# Patient Record
Sex: Male | Born: 1940 | Race: White | Hispanic: No | Marital: Married | Smoking: Former smoker
Health system: Southern US, Community
[De-identification: ages and names within clinical notes are randomized; demographics above are authoritative.]

## PROBLEM LIST (undated history)

## (undated) DIAGNOSIS — H9190 Unspecified hearing loss, unspecified ear: Secondary | ICD-10-CM

## (undated) DIAGNOSIS — R3129 Other microscopic hematuria: Secondary | ICD-10-CM

## (undated) DIAGNOSIS — S0990XA Unspecified injury of head, initial encounter: Secondary | ICD-10-CM

## (undated) DIAGNOSIS — R350 Frequency of micturition: Secondary | ICD-10-CM

## (undated) DIAGNOSIS — I10 Essential (primary) hypertension: Secondary | ICD-10-CM

## (undated) DIAGNOSIS — M1990 Unspecified osteoarthritis, unspecified site: Secondary | ICD-10-CM

## (undated) DIAGNOSIS — I639 Cerebral infarction, unspecified: Secondary | ICD-10-CM

## (undated) DIAGNOSIS — E785 Hyperlipidemia, unspecified: Secondary | ICD-10-CM

## (undated) DIAGNOSIS — C801 Malignant (primary) neoplasm, unspecified: Secondary | ICD-10-CM

## (undated) DIAGNOSIS — I350 Nonrheumatic aortic (valve) stenosis: Secondary | ICD-10-CM

## (undated) DIAGNOSIS — E538 Deficiency of other specified B group vitamins: Secondary | ICD-10-CM

## (undated) DIAGNOSIS — F909 Attention-deficit hyperactivity disorder, unspecified type: Secondary | ICD-10-CM

## (undated) DIAGNOSIS — M419 Scoliosis, unspecified: Secondary | ICD-10-CM

## (undated) HISTORY — PX: HEMORRHOID SURGERY: SHX153

## (undated) HISTORY — DX: Hyperlipidemia, unspecified: E78.5

## (undated) HISTORY — PX: KNEE ARTHROSCOPY: SUR90

## (undated) HISTORY — PX: COLONOSCOPY: SHX174

## (undated) HISTORY — PX: THYROIDECTOMY, PARTIAL: SHX18

## (undated) HISTORY — DX: Other microscopic hematuria: R31.29

## (undated) HISTORY — PX: TONSILLECTOMY: SUR1361

## (undated) HISTORY — DX: Essential (primary) hypertension: I10

## (undated) HISTORY — DX: Deficiency of other specified B group vitamins: E53.8

---

## 2003-10-18 ENCOUNTER — Inpatient Hospital Stay (HOSPITAL_BASED_OUTPATIENT_CLINIC_OR_DEPARTMENT_OTHER): Admission: RE | Admit: 2003-10-18 | Discharge: 2003-10-18 | Payer: Self-pay | Admitting: *Deleted

## 2006-08-03 HISTORY — PX: CARDIAC CATHETERIZATION: SHX172

## 2006-11-09 ENCOUNTER — Ambulatory Visit: Payer: Self-pay | Admitting: Cardiology

## 2006-11-15 ENCOUNTER — Ambulatory Visit: Payer: Self-pay | Admitting: Cardiology

## 2006-11-18 ENCOUNTER — Inpatient Hospital Stay (HOSPITAL_BASED_OUTPATIENT_CLINIC_OR_DEPARTMENT_OTHER): Admission: RE | Admit: 2006-11-18 | Discharge: 2006-11-18 | Payer: Self-pay | Admitting: Cardiovascular Disease

## 2006-11-18 ENCOUNTER — Ambulatory Visit: Payer: Self-pay | Admitting: Cardiovascular Disease

## 2006-11-25 ENCOUNTER — Ambulatory Visit: Payer: Self-pay | Admitting: Cardiology

## 2007-08-04 HISTORY — PX: BACK SURGERY: SHX140

## 2008-05-10 ENCOUNTER — Inpatient Hospital Stay (HOSPITAL_COMMUNITY): Admission: RE | Admit: 2008-05-10 | Discharge: 2008-05-12 | Payer: Self-pay | Admitting: Orthopedic Surgery

## 2009-11-29 ENCOUNTER — Emergency Department (HOSPITAL_COMMUNITY): Admission: EM | Admit: 2009-11-29 | Discharge: 2009-11-29 | Payer: Self-pay | Admitting: Emergency Medicine

## 2010-12-16 NOTE — H&P (Signed)
NAME:  Joe Shah, Joe Shah                ACCOUNT NO.:  [id number]   MEDICAL RECORD NO.:  [id number]          PATIENT TYPE:  INP   LOCATION:                               FACILITY:  [facility]   PHYSICIAN:  Joe Shah, M.D.DATE OF BIRTH:  [birth year]   DATE OF ADMISSION:  05/10/2008  DATE OF DISCHARGE:                              HISTORY & PHYSICAL   CHIEF COMPLAINT:  Joe Shah is a 70 year old gentleman who incurred a  workman's comp injury of his back while on the job.  He has had pain in  the lower back radiating down the right leg with numbness and weakness  in the right leg and foot.  The MRI evaluation shows he has a central  disk rupture at L3-4 with moderate to severe stenosis with a large  ruptured disk migrating cranially at the L5-S1 region compressing the L5  nerve root.  The patient has elected to proceed with a central  decompression and microdiskectomy at L3-4 and a hemilaminectomy at the  L5-S1 right side with a microdiskectomy on the right.   ALLERGIES:  None.   CURRENT MEDICATIONS:  1. Quinapril 40 mg a day.  2. Simvastatin 20 mg a day.  3. Meloxicam 15 mg a day.   PRIMARY CARE PHYSICIAN:  Dr. Samuel Jester in [place]   CARDIOLOGIST:  Dr. Myrtis Ser.   CURRENT MEDICAL HISTORY:  1. Hypertension with a heart murmur.  2. Hypercholesterolemia.  3. Hearing impairment with bilateral hearing aids.  4. Dentures.   REVIEW OF SYSTEMS:  Negative for any neurologic issues.  No pulmonary  issues.  Cardiovascular issues have been stable.  Last stress test was 1  year previous.  GI, GU are unremarkable.  Endocrine is unremarkable.  Hematologic is unremarkable.   PAST SURGICAL HISTORY:  Includes a knee scope and lipoma removal without  any complications.   FAMILY MEDICAL HISTORY:  Father is deceased from old age.  Mother is  deceased from heart failure.   SOCIAL HISTORY:  The patient is married.  He works as a Naval architect.  He has smoked in the past.  No drugs or  alcohol.  Lives in a single  family home with his wife.   PHYSICAL EXAMINATION:  GENERAL:  The patient is a healthy-appearing  gentleman, conscious, alert, appropriate, appears to be good historian.  Appears to be in no extreme distress.  He ambulates and gets on and off  the exam table fairly easily.  HEENT: Head was normocephalic.  Gross hearing is intact with bilateral  hearing aids in place.  NECK:  Supple.  No palpable lymphadenopathy.  Good range of motion.  CHEST:  Lung sounds were clear and equal bilaterally.  No wheezes,  rales, rhonchi.  HEART:  Regular rate and rhythm with a 2/6 systolic murmur.  ABDOMEN:  Positive bowel sounds.  EXTREMITIES:  He had some loss of range of motion of both shoulders due  to some arthritic glenohumeral joints.  He had full range of motion of  his elbows and wrists.  LOWER EXTREMITIES: Both hips had full extension.  He had  30 degrees  internal/external rotation without any discomfort, both knees.  He had  full extension/flexion back to 120 degrees, no instability.  No signs of  infection.  Calves were soft, good motion at the ankles.  NEURO:  The patient was conscious, alert and appropriate, appeared to be  good historian.  He did have some decreased light touch sensation in the  right calf area.  He also had weakness with dorsiflexion at the ankle,  right knee extension and right hip flexor.  He had strong strength in  the left leg.  PERIPHERAL VASCULAR:  Carotid pulses were 2+, no bruits.  Radial pulses  were 2+; dorsalis pedis pulses were 2+.  BREAST, RECTAL AND GU EXAMS:  Deferred at this time.   IMPRESSION:  1. Spinal stenosis with central disk at L4-5 with large herniated disk      at L5-S1 impinging the right nerve root.  2. Hypertension.  3. Cardiac murmur.  4. Hypercholesterolemia.  5. Hearing impairment with bilateral hearing aids.  6. Dentures.   PLAN:  The patient has been cleared by Dr. Myrtis Ser for this upcoming  surgery.  The  patient will undergo all routine labs and tests prior to  having a central decompression at L3-4 with microdiskectomy, a  hemilaminectomy at L5-S1 on the right with microdiskectomy on the right.      Jamelle Rushing, P.A.    ______________________________  Joe Shah, M.D.    RWK/MEDQ  D:  05/01/2008  T:  05/01/2008  Job:  [id number]

## 2010-12-16 NOTE — Op Note (Signed)
NAME:  Joe Shah, Joe Shah                ACCOUNT NO.:  [id number]   MEDICAL RECORD NO.:  [id number]          PATIENT TYPE:  INP   LOCATION:  0005                         FACILITY:  [facility]   PHYSICIAN:  Georges Lynch. Gioffre, M.D.DATE OF BIRTH:  Oct 30, 1940   DATE OF PROCEDURE:  05/10/2008  DATE OF DISCHARGE:                               OPERATIVE REPORT   SURGEON:  Dr. Georges Lynch. Gioffre.   ASSISTANT:  Dr. Marlowe Kays.   PREOPERATIVE DIAGNOSES:  1. Severe spinal stenosis secondary to scoliosis at L3-4.  2. Spinal stenosis at L5-S1.  3. Large herniated disk at L5-S1, migrating cephalad.  Note, this pain      was all in the right leg preoperatively.   OPERATION:  1. Complete decompressive lumbar laminectomy at L3-4 for spinal      stenosis.  2. Decompressive lumbar laminectomy at L5-S1 for spinal stenosis.  3. Microdiskectomy at L5-S1 for a herniated disk on the right.  4. We did foraminotomies at L3-4 bilaterally and also at the S1 root      on the right and the L5 root on the right.   DESCRIPTION OF PROCEDURE:  Under general anesthesia, routine orthopedic  prep and draping of the back was carried out.  He had 2 gm of IV Ancef.  Two needles were placed in the back for localization purposes.  An x-ray  was taken to verify the position.  An incision was made over the L3-4  all the way down to the 5-1 space.  Bleeders identified and cauterized.  The muscle was stripped from the spinous processes of the lamina  bilaterally.  Another x-ray was taken to verify our position.  We then  inserted the self-retaining McCullough retractors.  I then removed the  spinous process of L3 and part of L4.  We completely decompressed the L3-  4 space.  We went out and did foraminotomies bilaterally, and we had  completed this part of the procedure.  We did use the microscope during  the procedure.  We removed the ligamentum flavum in the usual fashion,  which was quite thickened in this area.  Note, when  we completed the  decompression at this level, we were able to easily go out with the  hockey-stick bilaterally and the root now was decompressed.  We then  applied some thrombin-soaked Gelfoam.  We then advanced the dissection  distally, where we went down and removed practically the entire lamina  of L5 on the right.  We decompressed a large area of stenosis.  The  ligamentum flavum was severely thickened.  We had to go distally and  laterally.  Once this was done, we decompressed the lateral recess as  well.  We identified the S1 root above the L5 root.  We gently retracted  the dura and made a cruciate incision in the posterior longitudinal  ligament and did a microdiskectomy.  Note when we completed this  procedure, we checked the foramina for the 5 root and the S1 root on the  right and they were completely free.  The dura now was easily  movable  and there was no further compression.  We thoroughly irrigated out the  area.  Note, the microscope was used at this level as well.  We went  through a meticulous dissection because of his severe scoliosis.  We  thoroughly irrigated out the area.  I then injected 10 mL of FloSeal,  followed by some loose application of Gelfoam.  I then closed the wound  in layers in the  usual fashion, except I left a small distal and deep proximal part of  the wound open for drainage purposes.  The subcu was closed with 0  Vicryl, skin with metal staples and a sterile Neosporin dressing was  applied.  The patient had 2 gm of IV Ancef preop.           ______________________________  Georges Lynch. Darrelyn Hillock, M.D.     RAG/MEDQ  D:  05/10/2008  T:  05/11/2008  Job:  [id number]   cc:   Marlowe Kays, M.D.  Fax: [phone]

## 2010-12-19 NOTE — Discharge Summary (Signed)
NAME:  BLAS, RICHES                ACCOUNT NO.:  [id number]   MEDICAL RECORD NO.:  [id number]          PATIENT TYPE:  INP   LOCATION:  1536                         FACILITY:  [facility]   PHYSICIAN:  Georges Lynch. Gioffre, M.D.DATE OF BIRTH:  22-Sep-1940   DATE OF ADMISSION:  05/10/2008  DATE OF DISCHARGE:  05/12/2008                               DISCHARGE SUMMARY   ADMISSION DIAGNOSIS:  1. Spinal stenosis with central disk at L4-5 with a large herniated      disk at L5-S1 on the right.  2. Hypertension.  3. Cardiac murmur.  4. Hypercholesterolemia.  5. Hearing impairment with bilateral hearing aids.  6. Dentures.   DISCHARGE DIAGNOSES:  1. A central decompression at L3-4 and L5-S1 with microdiskectomy at      L5-S1 on the right  2. Hypertension.  3. Cardiac murmur.  4. Hypercholesterolemia.  5. Hearing impairment with bilateral hearing aids.  6. Dentures.   HISTORY OF PRESENT ILLNESS:  The patient is a 70 year old male who  incurred a Worker's Comp injury to his lower back on the job.  He had  lower back pain with right leg pain and numbness, weakness.  MRI found  disk rupture L3-4, with moderate to severe stenosis at that level and a  large disk migrating cranially at L5-S1 region, compressing the L5 nerve  root.  The patient has elected to proceed with surgical correction.   ALLERGIES:  NONE.   MEDICATIONS ON ADMISSION:  1. Quinapril 40 mg a day.  2. Simvastatin 20 mg a day.  3. Meloxicam 15 mg a day.   SURGICAL PROCEDURE:  On May 10, 2008, the patient was taken to the  OR by Dr. Worthy Rancher, assisted by Dr. Leslee Home.  Under general  anesthesia, the patient underwent a decompressive lumbar laminectomy at  L3-4, L5-S1 with a microdiskectomy at L5-S1 on the right.  The patient  tolerated the procedure well.  Estimated blood loss was 300 mL, no  complications.  The patient was transferred to the recovery room, then  to the orthopedic floor in good condition.   CONSULTS:   Routine consult with physical therapy, case management and  pharmacy.   HOSPITAL COURSE:  On May 10, 2008, the patient was admitted to  [facility] under the care of Dr. Ranee Gosselin.  The patient  was taken to the OR where a decompressive lumbar laminectomy was  performed at L3-4 and L5-S1 with microdiskectomy at L5-S1 on the right.  The patient tolerated the procedure well and was transferred recovery  room and then to the orthopedic floor in good condition on IV pain  medicines and antibiotics.  The patient was also able to get out of bed  with physical therapy per protocol.  The patient then incurred 2 days  postoperative course without any complications.  The patient's wound  remained benign for any signs of infection.  Vital signs remained  stable, he remained afebrile.  He was able to void.  He was able to  transition off IV medicines to p.o. meds well.  The patient worked well  with  physical therapy and was able to ambulate greater than 400 feet.  The patient's lower extremities remained neuromotor vascularly intact.  He did have some back discomfort.  His wound was benign.  The patient  had arrangements made for outpatient discharge to home with his physical  therapy and routine follow-up.   LABORATORY DATA:  CBC on admission was within normal limits as were  routine chemistries.  Urinalysis just showed moderate hemoglobin with an  estimated GFR of greater than 60.  EKG on admission was normal sinus  rhythm at 63 beats per minute.  Chest x-ray on admission showed no  active disease.   DISCHARGE INSTRUCTIONS:   DIET:  No restrictions.   ACTIVITY:  Patient is to follow with physical therapy instructions, may  get up and ambulate.   WOUND CARE:  The patient is to change his dressing daily.   FOLLOW UP:  The patient needs a follow-up appointment with Dr. Darrelyn Hillock  in 2 weeks from date of surgery.  The patient is to call [phone], for  that follow-up  appointment.   MEDICATIONS:  1. Percocet 10/650 one tablet every 4-6 hours for pain if needed.  2. Robaxin 500 mg 1 tablet every 6 hours for muscle spasms if needed.  3. Aspirin 81 mg a day.  4. Simvastatin 20 mg a day.  5. Meloxicam 15 mg a day.  6. Quinapril 40 mg once a day.  7. Benadryl 25 mg at night if needed.   The patient's condition upon discharge home is listed as improved and  good.      Jamelle Rushing, P.A.    ______________________________  Georges Lynch Darrelyn Hillock, M.D.    RWK/MEDQ  D:  05/31/2008  T:  05/31/2008  Job:  [id number]   cc:   Windy Fast A. Darrelyn Hillock, M.D.  Fax: [phone]

## 2010-12-19 NOTE — Letter (Signed)
November 26, 2006    [facility]   RE:  JABRON, Joe Shah  MRN:  [id number]  /  DOB:  May 01, 1941   To Whom it May Concern:   This letter is concerning Mr. [name], a patient of mine.  The  patient is a 70 year old male with moderately severe aortic stenosis.  As part of the evaluation, the patient underwent a cardiac  catheterization and his valve area was 1.1 sq cm with a mean gradient of  21 mmHg.  The patient was found to have no significant coronary artery  disease.  He has normal LV function.   As part of the evaluation also the patient underwent stress testing.  The patient was able to achieve 9.4 METS.  His maximum heart rate was  147 beats per minute.  Maximum blood pressure was 163/86.  The patient  did not develop shortness of breath during stress testing.  There were  no electrocardiographic changes suggestive of ischemia state.   In summary it appears the patient only has moderately severe aortic  stenosis with no coronary artery disease.  Mild dyspnea does not appear  to be secondary to aortic valve disease but rather to deconditioning.  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  He will receive a six month cardiovascular examination  and close monitoring of valve area of his aortic valve.  However, at the  present time his aortic disease is only moderately severe and causes no  functional limitation.  He also has no symptoms of chest pain or  syncope.  He has no coronary artery disease.  The ejection fraction is  within normal limits.  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  His  next physical examination will be in six months as well as formal  evaluation with echocardiography.   If you have any further questions regarding this patient, please do not  hesitate to contact me.     Sincerely,      Learta Codding, MD,FACC  Electronically Signed    GED/MedQ  DD: 11/26/2006  DT: 11/26/2006  Job #: [id number]

## 2010-12-19 NOTE — Assessment & Plan Note (Signed)
[facility]                          [facility] OFFICE NOTE   HARLEN, DANFORD                       MRN:          [id number]  DATE:11/25/2006                            DOB:          1941-02-15    HISTORY OF PRESENT ILLNESS:  The patient is a 70 year old male with  moderately severe aortic stenosis.  The patient underwent cardiac  catheterization.  Valve area was 1.1-cm2.  His mean gradient was 21-  mmHg.  The patient was found to have no significant coronary artery  disease.  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   In summary, it appears that the patient only has mildly severe aortic  stenosis with no coronary artery disease.  He does have some mild  dyspnea but this is likely secondary to deconditioning.  Based on my  prior evaluation, I stated that we would do an exercise stress test if  the valve area was greater than 1-cm2.  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   I did tell the patient today in the office that it is very likely that  he will in the next couple of years require aortic valve replacement and  will need followup every 6 months with repeat echocardiographic studies.   The plan is to proceed with an exercise treadmill test tomorrow.  If the  study demonstrates greater than 6 Metz tolerance, the patient will be  clear, at least in the short term, for commercial driving.     Learta Codding, MD,FACC     GED/MedQ  DD: 11/25/2006  DT: 11/25/2006  Job #: [id number]                            [facility]                          [facility] OFFICE NOTE   ZAHMIR, LALLA                       MRN:          [id number]  DATE:11/25/2006                            DOB:          1941/06/21    HISTORY OF PRESENT ILLNESS:  The patient is a 70 year old male with  moderately severe aortic stenosis.  The patient underwent cardiac  catheterization.  Valve area was 1.1-cm2.  His mean gradient was 21-  mmHg.  The patient was found to have no significant coronary artery  disease.  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   In summary, it appears that the patient only has mildly severe aortic  stenosis with no coronary artery disease.  He does have  some mild  dyspnea but this is likely secondary to deconditioning.  Based on my  prior evaluation, I stated that we would do an exercise stress test if  the valve area was greater than 1-cm2.  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   I did tell the patient today in the office that it is very likely that  he will in the next couple of years require aortic valve replacement and  will need followup every 6 months with repeat echocardiographic studies.   The plan is to proceed with an exercise treadmill test tomorrow.  If the  study demonstrates greater than 6 Metz tolerance, the patient will be  clear, at least in the short term, for commercial driving.     Learta Codding, MD,FACC  Electronically Signed    GED/MedQ  DD: 11/25/2006  DT: 11/25/2006  Job #: [id number]

## 2010-12-19 NOTE — Cardiovascular Report (Signed)
NAME:  ETHEL, VERONICA NO.:  [id number]   MEDICAL RECORD NO.:  [id number]                   PATIENT TYPE:  OIB   LOCATION:  6501                                 FACILITY:  [facility]   PHYSICIAN:  Vida Roller, M.D.                DATE OF BIRTH:  [birth year]   DATE OF PROCEDURE:  10/18/2003  DATE OF DISCHARGE:                              CARDIAC CATHETERIZATION   REFERRING PHYSICIAN:  Jonelle Sidle, M.D. [facility]   PRIMARY CARE PHYSICIAN:  Dr. Charm Barges   HISTORY OF PRESENT ILLNESS:  Mr. Bona is a 70 year old man who has a long  history of hypertension, dyslipidemia, and a remote history of tobacco abuse  who, over the last two years, has had progressive dyspnea on exertion.  He  does have aortic stenosis by echo done in 2001 but normal left ventricular  systolic function.  This was an assessment for coronary ischemia.  He is  followed by Dr. Simona Huh in [facility].   DESCRIPTION OF PROCEDURE:  After obtaining informed consent, the patient was  brought to the cardiac catheterization laboratory in a fasting state where  he was prepped and draped in the usual sterile manner and local anesthetic  was obtained over the right groin using 1% lidocaine without epinephrine.  He received conscious sedation with 1 mg Versed and 25 mcg of Fentanyl.  The  right femoral artery was cannulated using the modified Seldinger technique  with a 4 French 10 cm sheath.  Left heart catheterization was performed  using a 4 French Judkins left #4, a 4 French Judkins right #4, and a 4  French angled pigtail catheter.  The aortic valve was difficult to cross and  was crossed with a Wooley wire and a right Judkins catheter and then an  exchange length J-wire was used to position the pigtail catheter into the  left ventricle.  Left ventriculography was performed in the 30 degree RAO  view.  The pigtail catheter was then prolapsed back across the aortic valve  under  continuous pressure monitoring.  At the completion of the procedure,  the catheters were removed.  The patient recovered in the cardiology holding  area.  The femoral artery sheath was removed.  Hemostasis was obtained using  direct manual pressure.  At the conclusion of the hold, there was no  evidence of ecchymosis or hematoma formation and distal pulses were intact.  Total fluoroscopic time was 7.8 minutes.  Total iodonized contrast was 120  mL.   RESULTS:  Aortic pressure 94/61 with a mean arterial pressure of 76 mmHg.   Left ventricular  pressure 114/0 with an end diastolic pressure of 12 mmHg.  Pull back gradient was approximately 25 mmHg.   The left main coronary artery is a normal size artery which is  angiographically normal.   The left anterior descending  coronary artery is a large vessel with two  diagonal branches and is free of disease.   The left circumflex coronary artery is a very large dominant vessel that has  several posterior descending and posterolateral branches as well as a large  obtuse marginal, all of which are free of disease.   The right coronary artery is a small nondominant vessel.   The left ventriculogram shows a normal systolic function at 60% with no  obvious wall  motion abnormalities and no mitral regurgitation.  The aortic  valve, however, is easily seen on fluoroscopy and is calcified  significantly.   ASSESSMENT:  1. Normal left dominant system with no evidence of coronary artery disease.  2. Mild to moderate calcific aortic stenosis.   RECOMMENDATIONS:  Follow up echocardiogram with Dr. Diona Browner.  I discussed  the case with him and he agrees.                                               Vida Roller, M.D.    JH/MEDQ  D:  10/18/2003  T:  10/20/2003  Job:  [id number]

## 2010-12-19 NOTE — Assessment & Plan Note (Signed)
[facility]                          [facility] OFFICE NOTE   Joe Shah, Joe Shah                      MRN:          [id number]  DATE:11/15/2006                            DOB:          16-Feb-1941    REASON FOR CONSULTATION:  Evaluation of aortic stenosis as part of a DOT  clearance physical.   HISTORY OF PRESENT ILLNESS:  The patient is a 70 year old male with  known aortic stenosis.  The patient was first diagnosed by  echocardiogram with mild aortic stenosis in 2001.  He had normal LV  function at that time.  He also has a long history of hypertension,  dyslipidemia, as well as remote as well as tobacco use.  The patient in  2005 developed progressive dyspnea on exertion and was referred for a  cardiac catheterization.  The patient did not have significant coronary  artery disease but did have by fluoroscopy significantly calcified  aortic valve with a normal ejection fraction.  It was felt that the  patient had mild to moderate calcific aortic stenosis.  The patient was  followed clinically at that time.  However, he has not been seen in our  clinic over the last several years.  More recently, the patient was  evaluated by Dr. Lyn Hollingshead by Derenda Mis as part of a DOT  physical/clearance.  The patient was found again to have a significant  murmur and was referred for repeat echocardiographic study.  This  demonstrated aortic valve area of 0.9 cm2 with a significantly calcified  valve, a peak gradient of 52 mmHg and Dr. Myrtis Ser felt that the patient had  moderate to severe aortic stenosis.  The ejection fraction remained  preserved.  Based on these findings, the patient was told he needed  further cardiology evaluation as part of his DOT clearance.   The patient denies any syncope or congestive heart failure symptoms.  He  also has no angina.  He does have shortness of breath on moderate  exertion.  However, the patient can walk up one flight  of stairs without  any difficulty.  He also stated that he can walk at least a mile to two  miles at a reasonably steady pace without any significant dyspnea.  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  The patient denies any orthopnea or PND, has no  palpitations, and as reported above he has no syncope.  CK here in the  office today also demonstrates normal sinus rhythm without any evidence  of significant left ventricular hypertrophy.   SOCIAL HISTORY:  The patient is a Naval architect, occasionally smokes.  He  denies any alcohol or drug use.   FAMILY HISTORY:  Notable for family history of congestive heart failure  and carcinoma.   PAST MEDICAL HISTORY:  Notable for heart murmur, aortic stenosis  delineated above, history of arthritis, hypertension, and dyslipidemia.   MEDICATIONS:  Quinapril 40 mg a day, Benicar 40 mg half tablet p.o.  weekly, simvastatin 20 mg p.o. daily, meloxicam 15 mg p.o. daily,  aspirin 81 mg a day,  fish oil, Ginseng and melatonin at bedtime.   REVIEW OF SYSTEMS:  As per HPI.  The patient denies any fever or chills.  The patient denies any palpitations, no syncope, no orthopnea or PND.  Positive for dyspnea on exertion as outlined above.  Joint stiffness and  arthritis complaints.  No pruritus, no weakness or tremors and no  anxiety or depression.  No melena or hematochezia.  No dysuria or  frequency.  The remainder of review of systems is within normal limits.   PHYSICAL EXAMINATION:  VITAL SIGNS:  Blood pressure is 150/87, heart  rate is 62 beats per minute (of note is that the patient in the past has  been tried on a diuretic which caused marked hypotension).  GENERAL:  Well-nourished white male in no apparent distress.  HEENT:  Pupils, eyelids, sclerae, conjunctivae clear.  NECK:  Supple.  Normal carotid upstroke with bilateral carotid bruits,  left greater than right.  JVD is approximately 6 cm.  LUNGS:   Clear breath sounds bilaterally.  HEART:  Regular rate and rhythm with a 3-4/6 crescendo/decrescendo  murmur with normal intensity of S2.  PMI is nondisplaced.  ABDOMEN:  Soft, nontender, no rebound or guarding.  Good bowel sounds.  EXTREMITIES:  No cyanosis, clubbing, or edema.  Peripheral pulses are  intact.  Dorsalis pedis and posterior tibial are 2+ bilaterally.  NEUROLOGICAL:  The patient is alert, oriented, and grossly nonfocal.   PROBLEM LIST:  1. Moderately severe aortic stenosis, valve area of 0.9 cm2 by echo      (calcific aortic stenosis).  2. History of nonobstructive coronary artery disease by      catheterization in 2005.  3. Hypertension.  4. Dyslipidemia.  5. Arthritis.   PLAN:  1. The patient based on the 2-D echocardiogram with the valve area of      less than 1 cm2 is disqualified from long distance truck driving.      This is in the setting of an asymptomatic patient.  Also, it is not      clear if the patient's symptoms of dyspnea are secondary to      deconditioning or whether they are related to underlying aortic      stenosis.  2. The patient still remains interested in being able to drive his      truck in the future.  However, I told the patient that if his valve      area truly is less than 1 cm2 whether or not he is not asymptomatic      he is disqualified.  We will proceed with a cardiac      catheterization, left and right heart cath, to determine the      pressure gradient and aortic valve area.  If his valve area is      greater than 1 cm2 then we can proceed with exercise stress      testing.  If the patient has a negative stress test and has good      exercise tolerance then he can be cleared for his DOT physical.  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  3. I have discussed my recommendation at length with the patient and     his wife and they are in clear understanding of the  strategy.  4. The patient will need further control of hypertension but this will  be difficult as he has poorly tolerated diuretics in the past.  We      will make further assessment after his cardiac catheterization.  5. The patient also has significant carotid bruits, in part they could      be radiating from the aortic valve and he has a fairly significant      bruit in the left side and we will obtain carotid Dopplers prior to      his cardiac catheterization.  6. The patient will follow up with me in the office in the next couple      of weeks.  In the interim, he cannot drive and has been taken out      of work by the DOT until at least the end of this month.     Learta Codding, MD,FACC  Electronically Signed    GED/MedQ  DD: 11/15/2006  DT: 11/15/2006  Job #: [id number]   cc:   Jonelle Sidle, MD  Dr. Charm Barges  Dr. Lyn Hollingshead at [facility], [facility]

## 2010-12-19 NOTE — Cardiovascular Report (Signed)
NAME:  Joe Shah, Joe Shah NO.:  [id number]   MEDICAL RECORD NO.:  [id number]          PATIENT TYPE:  OIB   LOCATION:  1962                         FACILITY:  [facility]   PHYSICIAN:  Veverly Fells. Excell Seltzer, MD  DATE OF BIRTH:  [birth year]   DATE OF PROCEDURE:  11/18/2006  DATE OF DISCHARGE:                            CARDIAC CATHETERIZATION   PROCEDURE:  Left heart catheterization, right heart catheterization,  selective coronary angiography, left ventricular angiography.   INDICATION:  Joe Shah is a 70 year old gentleman with moderate to  severe aortic stenosis.  His aortic valve area by echocardiogram was 0.9  cm2 with a peak gradient of 52 mmHg.  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  He has  had some symptoms of shortness of breath.  He was referred for cardiac  catheterization to further define the severity of his aortic stenosis,  as well as the etiology of his symptoms.   Risks and indications of the procedure were explained to the patient.  Informed consent was obtained.  The right groin was prepped, draped and  anesthetized with 1% lidocaine.  Using the modified Seldinger technique,  a 6-French sheath was placed in the right femoral vein and a 5-French  sheath was placed in the right femoral artery.  A multipurpose catheter  was used for the right heart catheterization.  Pressures were recorded  throughout the right heart chambers from the right atrium to the  pulmonary capillary wedge position.  Oxygen saturations were drawn from  the superior vena cava, pulmonary artery and central aorta. Cardiac  output calculations were performed using the Fick technique.   Following the right heart catheterization, an AL-1 catheter was inserted  near the aortic valve, and a straight tip wire was used to cross the  valve.  Crossing the valve was moderately difficult.  The catheter was  then exchanged out over the  straight tip wire for a pigtail catheter and  opening left ventricular pressures were recorded.  A left ventriculogram  was performed. After re-flushing the catheter with saline, a careful  pullback across the aortic valve was performed.  At the conclusion of  the procedure, the sheaths were pulled and manual pressure used for  hemostasis.  There were no immediate complications.   FINDINGS:  Right atrial pressure A-wave 10, V-wave 9, mean of 8.  Right  ventricular pressures is 28/5 with an end-diastolic pressure of 9.  Pulmonary artery pressure is 25/10 with a mean of 17.  Wedge pressure  mean is 13.  Left ventricular pressure is 164/8 with an end-diastolic  pressure of 16.  Aortic pressure is 141/75 with a mean of 105.  The peak  aortic valve gradient is 23 mmHg, and the mean aortic valve gradient is  21 mmHg.  The aortic valve area is 1.1 cm2.  SVC oxygen saturation 67%.  Pulmonary artery saturation 64%.  Aortic saturation is 93%.   Cardiac output by Fick is 4.7 liters per minute.  Cardiac index is 2.3  liters per minute per meter squared.  Coronary angiography - the left mainstem has minor luminal  irregularities that bifurcates into the LAD and left circumflex.   The LAD is a medium caliber vessel that courses down the LV apex.  It  gives off a medium-sized first and second diagonal branch.  There are  minor luminal irregularities in the proximal LAD, as well as mild  calcification.  There is no significant angiographic disease throughout  the LAD system.   The left circumflex is large caliber.  It is a dominant vessel.  It  gives off a large first OM branch followed by two left posterolateral  branches and a left PDA branch.  There is no significant angiographic  disease throughout the left circumflex system.   The right coronary is artery is small and nondominant.  It gives off a  small RV marginal branch from its midportion.  There is no significant  angiographic disease in  the right coronary artery.   Left ventriculography performed in the 30 degree RAO projection shows  normal left ventricular systolic function with an estimated LVEF of 60%.  There is no mitral regurgitation.  The aortic valve is heavily calcified  by fluoroscopy.   ASSESSMENT:  1. Moderate aortic stenosis with a mean aortic valve gradient of 21      mmHg and an aortic valve area calculated at 1.1 cm2.  2. Normal coronary arteries with the exception of very minor plaque in      the proximal left anterior descending and left mainstem.  3. Normal left ventricular systolic function.   The patient will follow-up with Dr. 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  At this point, does not  meet criteria for aortic valve surgery and will require continue  longitudinal follow-up.      Veverly Fells. Excell Seltzer, MD  Electronically Signed     MDC/MEDQ  D:  11/18/2006  T:  11/18/2006  Job:  [id number]   cc:   Learta Codding, MD,FACC

## 2011-05-05 LAB — COMPREHENSIVE METABOLIC PANEL
AST: 22
Albumin: 3.8
CO2: 27
Calcium: 9.5
GFR calc non Af Amer: >60
Glucose, Bld: 100 — ABNORMAL HIGH
Sodium: 145
Total Protein: 6.2

## 2011-05-05 LAB — CBC
HCT: 40.1
Hemoglobin: 13.1
MCV: 82.7
Platelets: 205
RDW: 14.3
WBC: 4.9

## 2011-05-05 LAB — URINE CULTURE
Colony Count: NO GROWTH
Special Requests: NEGATIVE

## 2011-05-05 LAB — URINALYSIS, ROUTINE W REFLEX MICROSCOPIC
Glucose, UA: NEGATIVE
Nitrite: NEGATIVE
Specific Gravity, Urine: 1.026
Urobilinogen, UA: 0.2
pH: 5.5

## 2011-05-05 LAB — DIFFERENTIAL
Basophils Relative: 1
Eosinophils Relative: 4
Lymphocytes Relative: 31
Lymphs Abs: 1.5
Neutrophils Relative %: 56

## 2011-05-05 LAB — APTT: aPTT: 28

## 2011-05-05 LAB — PROTIME-INR: Prothrombin Time: 13.7

## 2011-07-23 ENCOUNTER — Encounter: Payer: Self-pay | Admitting: Cardiovascular Disease

## 2011-07-24 ENCOUNTER — Encounter: Payer: Self-pay | Admitting: Cardiovascular Disease

## 2011-07-24 ENCOUNTER — Telehealth: Payer: Self-pay | Admitting: *Deleted

## 2011-07-24 ENCOUNTER — Ambulatory Visit (INDEPENDENT_AMBULATORY_CARE_PROVIDER_SITE_OTHER): Payer: Medicare Other | Admitting: Cardiovascular Disease

## 2011-07-24 ENCOUNTER — Other Ambulatory Visit: Payer: Self-pay | Admitting: Cardiovascular Disease

## 2011-07-24 ENCOUNTER — Encounter: Payer: Self-pay | Admitting: *Deleted

## 2011-07-24 VITALS — BP 163/85 | HR 73 | Ht 69.0 in | Wt 191.0 lb

## 2011-07-24 DIAGNOSIS — I350 Nonrheumatic aortic (valve) stenosis: Secondary | ICD-10-CM

## 2011-07-24 DIAGNOSIS — I359 Nonrheumatic aortic valve disorder, unspecified: Secondary | ICD-10-CM | POA: Insufficient documentation

## 2011-07-24 DIAGNOSIS — R0989 Other specified symptoms and signs involving the circulatory and respiratory systems: Secondary | ICD-10-CM | POA: Insufficient documentation

## 2011-07-24 DIAGNOSIS — I10 Essential (primary) hypertension: Secondary | ICD-10-CM

## 2011-07-24 DIAGNOSIS — R0602 Shortness of breath: Secondary | ICD-10-CM

## 2011-07-24 NOTE — Assessment & Plan Note (Signed)
The patient likely has severe symptomatic aortic stenosis. In 2008 his aortic valve area was 1.1 cm. I suspect that his aortic valve area is now less than 0.8. I am hoping that his ejection fraction is normal. The patient has been having progressive symptoms of exertional angina without signs of heart failure or syncope. I recommend proceeding with urgent evaluation. He will be scheduled for an echocardiogram on Monday. I recommended receding with cardiac catheterization as well. This will be scheduled for next week on Wednesday. He will have a left and right cardiac catheterization. I am not planning on crossing the aortic valve as it does appear to be severe based on history and physical exam. The echo will be reviewed before then. Risks and benefits were discussed with the patient and all his questions were answered.

## 2011-07-24 NOTE — Telephone Encounter (Signed)
2 D ECHO -Set 07-27-2011 [facility] Cath set for 07-29-2011

## 2011-07-24 NOTE — Telephone Encounter (Signed)
Pt has Medicare and [employer] MCR sup, no precert required.

## 2011-07-24 NOTE — Progress Notes (Signed)
HPI  This is a 70 year old male who is here today for evaluation of chest pain. The patient has not been seen here in our clinic since 2008. He was evaluated at that time for aortic stenosis. He underwent cardiac catheterization which showed moderate stenosis with a valve area of 1.1 cm and a mean gradient of 21 mm mercury. He had no significant coronary artery disease. It was recommended for him to followup every 6 months. However, that did not happen. The patient called our office and requested an appointment due to recent chest pain. This started a few months ago. He gets substernal chest tightness with moderate activities especially when he is going uphill or going up one flight of stairs. It does not happen at low level of physical activities. It usually lasts for a few minutes and resolves with rest. This has been associated with dyspnea. He denies any orthopnea or PND. There has been no dizziness, syncope or presyncope.  No Known Allergies   Current Outpatient Prescriptions on File Prior to Visit  Medication Sig Dispense Refill  . aspirin EC 81 MG tablet Take 81 mg by mouth daily.        . meloxicam (MOBIC) 15 MG tablet Take 15 mg by mouth daily.        . quinapril (ACCUPRIL) 40 MG tablet Take 40 mg by mouth daily.        . simvastatin (ZOCOR) 20 MG tablet Take 20 mg by mouth at bedtime.           Past Medical History  Diagnosis Date  . Aortic valve disorders   . Occlusion and stenosis of carotid artery without mention of cerebral infarction   . 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 Other and unspecified hyperlipidemia   . Other B-complex deficiencies   . Unspecified essential hypertension      Past Surgical History  Procedure Date  . Knee arthroscopy   . Thyroidectomy, partial   . Lesions removal left chest and left forearm 10/02/1994  . Cardiac catheterization 2008    moderate AS. Normal coronary  arteries except for mild plaque in LAD     History reviewed. No pertinent family history.   History   Social History  . Marital Status: Married    Spouse Name: [name]    Number of Children: N/A  . 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 Smoking status: Former Smoker -- 0.5 packs/day for 35 years    Types: Cigarettes    Quit date: 08/03/1990  . Smokeless tobacco: Current User    Types: Snuff   Comment: dipping snuff 15 years  . Alcohol Use: No  . Drug Use: No  . Sexually Active: Not on file   Other Topics Concern  . Not on file   Social History Narrative  . No narrative on file     ROS Constitutional: Negative for fever, chills, diaphoresis, activity change, appetite change and fatigue.  HENT: Negative for hearing loss, nosebleeds, congestion, sore throat, facial swelling, drooling, trouble swallowing, neck pain, voice change, sinus pressure and tinnitus.  Eyes: Negative for photophobia, pain, discharge and visual disturbance.  Respiratory: Negative for apnea, cough and wheezing.  Cardiovascular: Negative for  palpitations and leg swelling.  Gastrointestinal: Negative for nausea, vomiting, abdominal pain, diarrhea, constipation, blood in stool and abdominal distention.  Genitourinary: Negative for dysuria, urgency, frequency, hematuria and decreased urine volume.  Musculoskeletal: Negative for myalgias, back pain, joint swelling, arthralgias and gait problem.  Skin: Negative for color change, pallor, rash and wound.  Neurological: Negative for dizziness, tremors, seizures, syncope, speech difficulty, weakness, light-headedness, numbness and headaches.  Psychiatric/Behavioral: Negative for suicidal ideas, hallucinations, behavioral problems and agitation. The patient is not nervous/anxious.     PHYSICAL EXAM   BP 163/85  Pulse 73  Ht 5\' 9"  (1.753 m)  Wt 191 lb (86.637 kg)  BMI 28.21 kg/m2   SpO2 95%  Constitutional: He is oriented to person, place, and time. He appears well-developed and well-nourished. No distress.  HENT: No nasal discharge.  Head: Normocephalic and atraumatic.  Eyes: Pupils are equal, round, and reactive to light. Right eye exhibits no discharge. Left eye exhibits no discharge.  Neck: Normal range of motion. Neck supple. No JVD present. No thyromegaly present. Bilateral carotid bruits versus radiated murmur. Cardiovascular: Normal rate, regular rhythm. Exam reveals no gallop and no friction rub.  There is a 3/6 systolic ejection murmur which is late peaking with absent S2. Pulmonary/Chest: Effort normal and breath sounds normal. No stridor. No respiratory distress. He has no wheezes. He has no rales. He exhibits no tenderness.  Abdominal: Soft. Bowel sounds are normal. He exhibits no distension. There is no tenderness. There is no rebound and no guarding.  Musculoskeletal: Normal range of motion. He exhibits no edema and no tenderness.  Neurological: He is alert and oriented to person, place, and time. Coordination normal.  Skin: Skin is warm and dry. No rash noted. He is not diaphoretic. No erythema. No pallor.  Psychiatric: He has a normal mood and affect. His behavior is normal. Judgment and thought content normal.       EKG: Normal sinus rhythm with no significant ST or T wave changes.   ASSESSMENT AND PLAN

## 2011-07-24 NOTE — Assessment & Plan Note (Signed)
His blood pressure is elevated. This will not be treated at this time due to severe aortic stenosis and risk of coronary hypoperfusion.

## 2011-07-24 NOTE — Patient Instructions (Signed)
Your physician recommends that you go to the [facility] for lab work/chest x-ray: DO TODAY! Your physician has requested that you have an echocardiogram. Echocardiography is a painless test that uses sound waves to create images of your heart. It provides your doctor with information about the size and shape of your heart and how well your heart's chambers and valves are working. This procedure takes approximately one hour. There are no restrictions for this procedure.  Your physician has requested that you have a cardiac catheterization. Cardiac catheterization is used to diagnose and/or treat various heart conditions. Doctors may recommend this procedure for a number of different reasons. The most common reason is to evaluate chest pain. Chest pain can be a symptom of coronary artery disease (CAD), and cardiac catheterization can show whether plaque is narrowing or blocking your heart's arteries. This procedure is also used to evaluate the valves, as well as measure the blood flow and oxygen levels in different parts of your heart. For further information please visit [email or web address] Please follow instruction sheet, as given.

## 2011-07-24 NOTE — Assessment & Plan Note (Signed)
This could be from a radiated aortic murmur. However, I do recommend carotid ultrasound Doppler before anticipated surgery. This was not ordered today.

## 2011-07-27 DIAGNOSIS — I359 Nonrheumatic aortic valve disorder, unspecified: Secondary | ICD-10-CM

## 2011-07-29 ENCOUNTER — Encounter (HOSPITAL_BASED_OUTPATIENT_CLINIC_OR_DEPARTMENT_OTHER)
Admission: RE | Disposition: A | Discharge status: Home / Self Care | Payer: Self-pay | Source: Ambulatory Visit | Attending: Cardiovascular Disease

## 2011-07-29 ENCOUNTER — Inpatient Hospital Stay (HOSPITAL_BASED_OUTPATIENT_CLINIC_OR_DEPARTMENT_OTHER)
Admission: RE | Admit: 2011-07-29 | Discharge: 2011-07-29 | Disposition: A | Discharge status: Home / Self Care | Payer: Medicare Other | Source: Ambulatory Visit | Attending: Cardiovascular Disease | Admitting: Cardiovascular Disease

## 2011-07-29 ENCOUNTER — Encounter (HOSPITAL_BASED_OUTPATIENT_CLINIC_OR_DEPARTMENT_OTHER): Payer: Self-pay | Admitting: Cardiovascular Disease

## 2011-07-29 DIAGNOSIS — I359 Nonrheumatic aortic valve disorder, unspecified: Secondary | ICD-10-CM

## 2011-07-29 DIAGNOSIS — R079 Chest pain, unspecified: Secondary | ICD-10-CM | POA: Insufficient documentation

## 2011-07-29 DIAGNOSIS — I2510 Atherosclerotic heart disease of native coronary artery without angina pectoris: Secondary | ICD-10-CM | POA: Insufficient documentation

## 2011-07-29 DIAGNOSIS — I350 Nonrheumatic aortic (valve) stenosis: Secondary | ICD-10-CM

## 2011-07-29 DIAGNOSIS — I10 Essential (primary) hypertension: Secondary | ICD-10-CM | POA: Insufficient documentation

## 2011-07-29 DIAGNOSIS — I6529 Occlusion and stenosis of unspecified carotid artery: Secondary | ICD-10-CM | POA: Insufficient documentation

## 2011-07-29 DIAGNOSIS — R0609 Other forms of dyspnea: Secondary | ICD-10-CM | POA: Insufficient documentation

## 2011-07-29 DIAGNOSIS — R0989 Other specified symptoms and signs involving the circulatory and respiratory systems: Secondary | ICD-10-CM | POA: Insufficient documentation

## 2011-07-29 LAB — POCT I-STAT 3, VENOUS BLOOD GAS (G3P V)
Acid-base deficit: 4 mmol/L — ABNORMAL HIGH (ref 0.0–2.0)
O2 Saturation: 61 %

## 2011-07-29 LAB — POCT I-STAT 3, ART BLOOD GAS (G3+)
Bicarbonate: 21.8 mEq/L (ref 20.0–24.0)
TCO2: 23 mmol/L (ref 0–100)
pH, Arterial: 7.353 (ref 7.350–7.450)
pO2, Arterial: 58 mmHg — ABNORMAL LOW (ref 80.0–100.0)

## 2011-07-29 SURGERY — JV LEFT AND RIGHT HEART CATHETERIZATION WITH CORONARY ANGIOGRAM
Anesthesia: Moderate Sedation

## 2011-07-29 MED ORDER — SODIUM CHLORIDE 0.9 % IV SOLN
INTRAVENOUS | Status: DC
  Administered 2011-07-29: 10:00:00 via INTRAVENOUS

## 2011-07-29 MED ORDER — GUAIFENESIN-CODEINE 100-10 MG/5ML PO SOLN
5.0000 mL | Freq: Once | ORAL | Status: AC
  Administered 2011-07-29: 5 mL via ORAL

## 2011-07-29 MED ORDER — ASPIRIN 81 MG PO CHEW
324.0000 mg | CHEWABLE_TABLET | ORAL | Status: AC
  Administered 2011-07-29: 324 mg via ORAL

## 2011-07-29 MED ORDER — SODIUM CHLORIDE 0.9 % IV SOLN: INTRAVENOUS | Status: AC

## 2011-07-29 MED ORDER — DIAZEPAM 5 MG PO TABS
5.0000 mg | ORAL_TABLET | ORAL | Status: AC
  Administered 2011-07-29: 5 mg via ORAL

## 2011-07-29 NOTE — Op Note (Signed)
Cardiac Catheterization Procedure Note  Name: Joe Shah MRN: [id number] DOB: January 16, 1941  Procedure: Right Heart Cath, Selective Coronary Angiography.  Indication: This is a 70 year old male who presented with progressive symptoms of exertional chest tightness. He was known to have moderate aortic stenosis but has not been seen since 2008. I referred him for an echocardiogram which was done at [facility]. It showed normal LV systolic function with an estimated ejection fraction of 55-60%, mild LVH, mildly dilated left atrium and evidence of severe aortic stenosis with a mean gradient of 53 mm mercury and a calculated valve area of 0.92. Cardiac catheterization was recommended in anticipation of aortic valve replacement.   Procedural Details: The right groin was prepped, draped, and anesthetized with 1% lidocaine. Using the modified Seldinger technique a 4 French sheath was placed in the right femoral artery and a 7 French sheath was placed in the right femoral vein. A Swan-Ganz catheter was used for the right heart catheterization. Standard protocol was followed for recording of right heart pressures and sampling of oxygen saturations. Fick cardiac output was calculated. Standard Judkins catheters were used for selective coronary angiography. I did not attempt to cross the aortic valve which is known to have severe stenosis based on echocardiogram. There were no immediate procedural complications. The patient was transferred to the post catheterization recovery area for further monitoring.  Procedural Findings: Hemodynamics RA 4 mmHg RV 30/2 mmHg PA 26/6/14 mmHg PCWP 6 mmHg  AO 94/59/74 mmHg  Oxygen saturations: PA 61 AO 89  Cardiac Output (Fick) 5.2  Cardiac Index (Fick) 2.6   Pulmonary vascular resistance (PVR): 1.5 Woods units.   Coronary angiography: On fluoroscopy, the aortic valve appears to be severely calcified. Coronary dominance: Left   Left Main:  The vessel is  normal in size and free of significant disease.  Left Anterior Descending (LAD):  The vessel is normal in size with minor irregularities without evidence of obstructive disease. Mild calcifications are noted in the proximal segment with 20% tubular stenosis.  1st diagonal (D1):  Normal in size and free of significant disease.  2nd diagonal (D2):  Normal in size and free of significant disease.  3rd diagonal (D3):  Very small in size.  Circumflex (LCx):  Large in size and dominant. The vessel has minor irregularities without evidence of obstructive disease.  1st obtuse marginal:  Very small in size.  2nd obtuse marginal:  Large in size and free of significant disease. There is 30% proximal stenosis.  3rd obtuse marginal:  Small in size and free of significant disease.  The AV groove artery is large in size without significant obstructive disease. It gives a normal size PDA and 2 posterolateral branches which are all free of significant disease.   Right Coronary Artery: The vessel is small in size and nondominant. It is free of significant disease. Left ventriculography: Was not performed.  Final Conclusions:   1. Normal filling pressures without significant pulmonary hypertension. Normal cardiac output. 2. mild nonobstructive coronary artery disease. 3. Severe aortic stenosis by echocardiogram with a mean gradient of 53 mm mercury and a valve area of 0.92.  Recommendations: The patient has symptomatic severe aortic stenosis without significant coronary artery disease. Aortic valve replacement is recommended. I will request a surgical consult.  Lorine Bears MD, [facility] 07/29/2011, 10:42 AM

## 2011-07-29 NOTE — Interval H&P Note (Signed)
History and Physical Interval Note:  07/29/2011 10:08 AM  Joe Shah  has presented today for surgery, with the diagnosis of CAD/AS  The various methods of treatment have been discussed with the patient and family. After consideration of risks, benefits and other options for treatment, the patient has consented to  Procedure(s): JV LEFT AND RIGHT HEART CATHETERIZATION WITH CORONARY ANGIOGRAM as a surgical intervention .  The patients' history has been reviewed, patient examined, no change in status, stable for surgery.  I have reviewed the patients' chart and labs.  Questions were answered to the patient's satisfaction.     Lorine Bears

## 2011-07-29 NOTE — H&P (View-Only) (Signed)
HPI  This is a 70-year-old male who is here today for evaluation of chest pain. The patient has not been seen here in our clinic since 2008. He was evaluated at that time for aortic stenosis. He underwent cardiac catheterization which showed moderate stenosis with a valve area of 1.1 cm and a mean gradient of 21 mm mercury. He had no significant coronary artery disease. It was recommended for him to followup every 6 months. However, that did not happen. The patient called our office and requested an appointment due to recent chest pain. This started a few months ago. He gets substernal chest tightness with moderate activities especially when he is going uphill or going up one flight of stairs. It does not happen at low level of physical activities. It usually lasts for a few minutes and resolves with rest. This has been associated with dyspnea. He denies any orthopnea or PND. There has been no dizziness, syncope or presyncope.  No Known Allergies   Current Outpatient Prescriptions on File Prior to Visit  Medication Sig Dispense Refill  . aspirin EC 81 MG tablet Take 81 mg by mouth daily.        . meloxicam (MOBIC) 15 MG tablet Take 15 mg by mouth daily.        . quinapril (ACCUPRIL) 40 MG tablet Take 40 mg by mouth daily.        . simvastatin (ZOCOR) 20 MG tablet Take 20 mg by mouth at bedtime.           Past Medical History  Diagnosis Date  . Aortic valve disorders   . Occlusion and stenosis of carotid artery without mention of cerebral infarction   . 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 Other and unspecified hyperlipidemia   . Other B-complex deficiencies   . Unspecified essential hypertension      Past Surgical History  Procedure Date  . Knee arthroscopy   . Thyroidectomy, partial   . Lesions removal left chest and left forearm 10/02/1994  . Cardiac catheterization 2008    moderate AS. Normal coronary  arteries except for mild plaque in LAD     History reviewed. No pertinent family history.   History   Social History  . Marital Status: Married    Spouse Name: [name]    Number of Children: N/A  . 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 Smoking status: Former Smoker -- 0.5 packs/day for 35 years    Types: Cigarettes    Quit date: 08/03/1990  . Smokeless tobacco: Current User    Types: Snuff   Comment: dipping snuff 15 years  . Alcohol Use: No  . Drug Use: No  . Sexually Active: Not on file   Other Topics Concern  . Not on file   Social History Narrative  . No narrative on file     ROS Constitutional: Negative for fever, chills, diaphoresis, activity change, appetite change and fatigue.  HENT: Negative for hearing loss, nosebleeds, congestion, sore throat, facial swelling, drooling, trouble swallowing, neck pain, voice change, sinus pressure and tinnitus.  Eyes: Negative for photophobia, pain, discharge and visual disturbance.  Respiratory: Negative for apnea, cough and wheezing.  Cardiovascular: Negative for  palpitations and leg swelling.  Gastrointestinal: Negative for nausea, vomiting, abdominal pain, diarrhea, constipation, blood in stool and abdominal distention.    Genitourinary: Negative for dysuria, urgency, frequency, hematuria and decreased urine volume.  Musculoskeletal: Negative for myalgias, back pain, joint swelling, arthralgias and gait problem.  Skin: Negative for color change, pallor, rash and wound.  Neurological: Negative for dizziness, tremors, seizures, syncope, speech difficulty, weakness, light-headedness, numbness and headaches.  Psychiatric/Behavioral: Negative for suicidal ideas, hallucinations, behavioral problems and agitation. The patient is not nervous/anxious.     PHYSICAL EXAM   BP 163/85  Pulse 73  Ht 5' 9" (1.753 m)  Wt 191 lb (86.637 kg)  BMI 28.21 kg/m2   SpO2 95%  Constitutional: He is oriented to person, place, and time. He appears well-developed and well-nourished. No distress.  HENT: No nasal discharge.  Head: Normocephalic and atraumatic.  Eyes: Pupils are equal, round, and reactive to light. Right eye exhibits no discharge. Left eye exhibits no discharge.  Neck: Normal range of motion. Neck supple. No JVD present. No thyromegaly present. Bilateral carotid bruits versus radiated murmur. Cardiovascular: Normal rate, regular rhythm. Exam reveals no gallop and no friction rub.  There is a 3/6 systolic ejection murmur which is late peaking with absent S2. Pulmonary/Chest: Effort normal and breath sounds normal. No stridor. No respiratory distress. He has no wheezes. He has no rales. He exhibits no tenderness.  Abdominal: Soft. Bowel sounds are normal. He exhibits no distension. There is no tenderness. There is no rebound and no guarding.  Musculoskeletal: Normal range of motion. He exhibits no edema and no tenderness.  Neurological: He is alert and oriented to person, place, and time. Coordination normal.  Skin: Skin is warm and dry. No rash noted. He is not diaphoretic. No erythema. No pallor.  Psychiatric: He has a normal mood and affect. His behavior is normal. Judgment and thought content normal.       EKG: Normal sinus rhythm with no significant ST or T wave changes.   ASSESSMENT AND PLAN   

## 2011-07-29 NOTE — Progress Notes (Signed)
Discharge instructions completed, ambulated to bathroom without bleeding.  Discharged to home via wheelchair with wife.

## 2011-07-31 ENCOUNTER — Institutional Professional Consult (permissible substitution) (INDEPENDENT_AMBULATORY_CARE_PROVIDER_SITE_OTHER): Payer: Medicare Other | Admitting: Cardiothoracic Surgery

## 2011-07-31 ENCOUNTER — Encounter: Payer: Self-pay | Admitting: Cardiothoracic Surgery

## 2011-07-31 VITALS — BP 144/87 | HR 78 | Resp 20 | Ht 69.0 in | Wt 180.0 lb

## 2011-07-31 DIAGNOSIS — I359 Nonrheumatic aortic valve disorder, unspecified: Secondary | ICD-10-CM

## 2011-07-31 DIAGNOSIS — E538 Deficiency of other specified B group vitamins: Secondary | ICD-10-CM

## 2011-07-31 NOTE — Patient Instructions (Signed)
   Aortic Stenosis Aortic stenosis, or aortic valve stenosis, is a narrowing of the aortic valve. When the aortic valve is narrowed, the valve does not open and close very well. This restricts blood flow between the left side of the heart and the aorta (the large artery which takes blood to the rest of the body). This restriction makes it hard for your heart to pump blood. This extra work can weaken your heart and can lead to heart failure. CAUSES   Causes of aortic valve stenosis can vary. Some of these can include:  Calcium deposits on the aortic valve. Calcium can buildup on the aortic valve and make it stiff. This cause of aortic stenosis is most common in people over the age of 65.     Congenital heart defect. This can occur during the development of the fetus and can result in an aortic valve defect.     Rheumatic fever. Rheumatic fever is a bacterial infection that can develop from a strep throat infection. The bacteria from rheumatic fever can attach themselves to the valve. This can cause scarring on the aortic valve, causing it to become narrow.  SYMPTOMS   Symptoms of aortic valve stenosis develop when the valve disease is severe. Symptoms can include:  Shortness of breath, especially with physical activity.     Feeling tired (fatigue).     Chest pain (angina) or tightness.     Feeing your heart race or beat funny (heart palpitations).     Dizziness or fainting.  DIAGNOSIS   Aortic stenosis is diagnosed through:  A physical exam and symptoms.     A heart murmur.     Echocardiography. This test uses sound waves to produce images of your heart.  TREATMENT    Surgery is the treatment for aortic valve stenosis.     Surgery may not be needed right away. Surgery is necessary when narrowing of the aortic valve becomes severe, and symptoms develop or become worse.     Medications cannot reverse aortic valve stenosis.  HOME CARE INSTRUCTIONS    If you have aortic  stenosis, you many need to avoid strenuous physical activity. Talk with your caregiver about what types of activities you should avoid.     If you are a woman with aortic valve stenosis and are of child-bearing age, talk to your caregiver before you become pregnant.     If you become pregnant, you will need to be monitored by your obstetrician and cardiologist throughout your pregnancy, labor and delivery, and after delivery.  SEEK IMMEDIATE MEDICAL CARE IF:  You develop chest pain or tightness.     You develop shortness of breath or difficulty breathing.     You develop lightheadedness or fainting.     You have heart palpitations or skipped heartbeats.  Document Released: 04/18/2003 Document Revised: 04/01/2011 Document Reviewed: 11/26/2009 [employer] Patient Information 2012 [employer]    Aortic Valve Replacement You have a disease of one of the valves of your heart. In you or your child's case, it is the aortic valve which needs replacing. Aortic valve replacement is open heart surgery done by a heart surgeon. This operation treats problems with the aortic valve. The aortic valve is the "outflow valve" for the left side of the heart. The left side of your heart (left ventricle) is the large muscular part of the heart that pumps blood to the rest of the body. It separates the left ventricle from the aorta. When the   heart squeezes down (contracts), the aortic valve is what keeps the blood from flowing back into the ventricle from the aorta. This allows the blood to keep moving through the body.   Surgery may be necessary when the valve does not open or close completely. A stenotic (narrow) valve does not let the blood leave the heart normally. This causes blood to back up in the left ventricle. This makes it hard for the heart to increase the amount of blood that it pumps. The heart has to work harder. This may produce shortness of breath and fatigue. Problems are worse with activity.   If  the valve leaflets do not meet correctly when closing, blood may leak backward into the ventricle each time the heart pumps. This is called aortic insufficiency. When some of the blood leaks backwards, the heart has to work even harder. The heart can allow for this over-work for a long time if the leakage came on slowly. Eventually, the heart fails.   Aortic valve problems may be caused by a birth defect. This is called congenital. Wear and tear can cause valves to fail. More commonly, rheumatic fever may damage the aortic valve. Occasionally, the valve may be damaged by infection. This also causes the aortic valve to leak.   DESCRIPTION OF SURGERY Aortic valves can be repaired. When the valve is too damaged to repair, the valve must be replaced. A prosthetic (artificial) valve is used to do this. Valves damaged by rheumatic disease often must be replaced.   Two types of artificial valves are available:  Mechanical valves made entirely from man-made materials.     Biological valves which are made from animal tissues or taken from a cadaver.  Each has advantages and disadvantages. The choice of which type to use should be made by you and your surgeon. Your risks, age, lifestyle, other medical problems including the decision on whether to be on blood thinners the rest of your life all will help you decide on which type of valve to use. There are a number of good MECHANICAL PROSTHESES available. All work well. The main advantage of mechanical valves is that they do not wear out. Their main disadvantage is that blood clots easier on mechanical valves. If this happens the valve will not work normally. Because of this, patients with mechanical valves must take anticoagulants (blood thinners) for life. There is also a small but definite risk of blood clots causing stroke, even when taking anticoagulants.   There are a number of BIOLOGICAL CHOICES for aortic valve replacement. Most are made from pig aortic  valves. Some are taken from cadavers. The main advantage is that they have a reduced risk of blood clots forming on the valve. This lessens the chance of the valve not working or causing a stroke. A large disadvantage of biological or tissue valves is that they wear out sooner than mechanical valves. The rate at which they wear out depends on the patient's age. A young boy might wear out such a valve in only a few years. The same valve might last 10 years in a middle aged person, and even longer in a patient over the age of 70. A tissue valve used in a person over 70 years old may never need replacement. RISKS AND COMPLICATIONS Your cardiologist and cardiothoracic surgeon can best determine your individual risk. It will depend on your age, general condition, medical conditions, and your heart function. In general, the risks include:  Problems from the operation itself   are low risk. Some common risks are:     Risks from the anesthesia.     Bleeding and infection.     Lifelong treatment with medications to prevent blood clots is needed for mechanical valve replacements.     Infection is more common with valve replacement than with valve repair.     Valve failure is more common with valve replacement than with valve repair. Pig valves tend to fail after about 8 to 10 years.  PROCEDURE   Valve repair or replacement is open-heart surgery. You are given general anesthesia (medications to help you sleep). You are then placed on a heart-lung machine. This machine provides oxygen to your blood while the heart is not working. The surgery generally lasts from 3 to 5 hours. During surgery, the surgeon makes a large incision (cut) in the chest. Sometimes the heart is cooled to slow or stop the heartbeat. The damaged aortic valve is either repaired or removed and replaced with an artificial heart valve. AFTER THE PROCEDURE  Recovery from heart valve surgery usually involves a few days in an intensive care unit  (ICU) of a hospital. Full recovery from heart valve surgery can take several months.     Anticoagulation (blood thinning) treatment with warfarin is often prescribed for 6 weeks to 3 months after surgery for those with biological valves. It is prescribed for life for those with mechanical valves.     Recovery includes healing of the surgical incision. There is a gradual building of stamina and exercise abilities. An exercise program under the direction of a physical therapist may be recommended.     Once you have an artificial valve, your heart function and your life will return to normal. You usually feel better after surgery. Shortness of breath and fatigue should lessen. If your heart was already severely damaged before your surgery, you may continue to have problems.     You can usually resume most of your normal activities. You will have to continue to monitor your condition. You need to watch out for blood clots and infections.     Artificial valves need to be replaced after a period of time. It is important that you see your caregiver regularly.     Some individuals with an aortic valve replacement need to take antibiotics before having dental work or other surgical procedures. This is called prophylactic antibiotic treatment. These drugs help to prevent infective endocarditis. Antibiotics are only recommended for individuals with the highest risk for developing infective endocarditis. Let your dentist and your caregiver know if you have a history of any of the following so that the necessary precautions can be taken:     A VSD.     A repaired VSD.     Endocarditis in the past.     An artificial (prosthetic) heart valve.  HOME CARE INSTRUCTIONS    Use all medications as prescribed.     Take your temperature every morning for the first week after surgery. Record these.     Weigh yourself every morning for at least the first week after surgery and record.     Do not lift more than  10 pounds (4.5 kg) until your sternum (breastbone) has healed. Avoid all activities which would place strain on your incision.     You may shower but do not take baths until instructed by your caregivers.     Avoid driving for 4 to 6 weeks following surgery or as instructed.       Use your elastic stockings during the day. You should wear the stockings for at least 2 weeks after discharge or longer if your ankles are swollen. The stockings help blood flow and help reduce swelling in the legs. It is easiest to put the stockings on before you get out of bed in the morning. They should fit snugly.  SEEK IMMEDIATE MEDICAL CARE IF:  You develop chest pain which is not coming from your incision (surgical cut) .     You develop shortness of breath.     You develop a temperature over 101 F (38.3 C).     You have a sudden weight gain. Let your caregiver know what the weight gain is.  Document Released: 12/09/2004 Document Revised: 04/01/2011 Document Reviewed: 07/16/2008 [employer] Patient Information 2012 [employer]  Aortic Valve Replacement Care After Read the instructions outlined below and refer to this sheet for the next few weeks. These discharge instructions provide you with general information on caring for yourself after you leave the hospital. Your surgeon may also give you specific instructions. While your treatment has been planned according to the most current medical practices available, unavoidable complications occasionally occur. If you have any problems or questions after discharge, please call your surgeon. AFTER THE PROCEDURE  Full recovery from heart valve surgery can take several months.     Blood thinning (anticoagulation) treatment with warfarin is often prescribed for 6 weeks to 3 months after surgery for those with biological valves. It is prescribed for life for those with mechanical valves.     Recovery includes healing of the surgical incision. There is a gradual  building of stamina and exercise abilities. An exercise program under the direction of a physical therapist may be recommended.     Once you have an artificial valve, your heart function and your life will return to normal. You usually feel better after surgery. Shortness of breath and fatigue should lessen. If your heart was already severely damaged before your surgery, you may continue to have problems.     You can usually resume most of your normal activities. You will have to continue to monitor your condition. You need to watch out for blood clots and infections.     Artificial valves need to be replaced after a period of time. It is important that you see your caregiver regularly.     Some individuals with an aortic valve replacement need to take antibiotics before having dental work or other surgical procedures. This is called prophylactic antibiotic treatment. These drugs help to prevent infective endocarditis. Antibiotics are only recommended for individuals with the highest risk for developing infective endocarditis. Let your dentist and your caregiver know if you have a history of any of the following so that the necessary precautions can be taken:     A VSD.     A repaired VSD.     Endocarditis in the past.     An artificial (prosthetic) heart valve.  HOME CARE INSTRUCTIONS    Use all medications as prescribed.     Take your temperature every morning for the first week after surgery. Record these.     Weigh yourself every morning for at least the first week after surgery and record.     Do not lift more than 10 pounds (4.5 kg) until your breastbone (sternum) has healed. Avoid all activities which would place strain on your incision.     You may shower as soon as directed by your caregiver after   surgery. Pat incisions dry. Do not rub incisions with washcloth or towel.     Avoid driving for 4 to 6 weeks following surgery or as instructed.     Use your elastic stockings during  the day. You should wear the stockings for at least 2 weeks after discharge or longer if your ankles are swollen. The stockings help blood flow and help reduce swelling in the legs. It is easiest to put the stockings on before you get out of bed in the morning. They should fit snugly.  Pain Control  If a prescription was given for a pain reliever, please follow your doctor's directions.     If the pain is not relieved by your medicine, becomes worse, or you have difficulty breathing, call your surgeon.  Activity  Take frequent rest periods throughout the day.     Wait one week before returning to strenuous activities such as heavy lifting (more than 10 pounds), pushing or pulling.     Talk with your doctor about when you may return to work and your exercise routine.     Do not drive while taking prescription pain medication.  Nutrition  You may resume your normal diet.     Drink plenty of fluids (6-8 glasses a day).     Eat a well-balanced diet.     Call your caregiver for persistent nausea or vomiting.  Elimination Your normal bowel function should return. If constipation should occur, you may:  Take a mild laxative.     Add fruit and bran to your diet.     Drink more fluids.     Call your doctor if constipation is not relieved.  SEEK IMMEDIATE MEDICAL CARE IF:    You develop chest pain which is not coming from your surgical cut (incision).     You develop shortness of breath or have difficulty breathing.     You develop a temperature over 101 F (38.3 C).     You have a sudden weight gain. Let your caregiver know what the weight gain is.     You develop a rash.     You develop any reaction or side effects to medications given.     You have increased bleeding from wounds.     You see redness, swelling, or have increasing pain in wounds.     You have pus coming from your wound.     You develop lightheadedness or feel faint.  Document Released: 02/05/2005  Document Revised: 04/01/2011 Document Reviewed: 04/29/2005 [employer] Patient Information 2012 [employer]. 

## 2011-08-04 ENCOUNTER — Encounter: Payer: Self-pay | Admitting: Cardiothoracic Surgery

## 2011-08-04 DIAGNOSIS — E538 Deficiency of other specified B group vitamins: Secondary | ICD-10-CM | POA: Insufficient documentation

## 2011-08-04 NOTE — Progress Notes (Signed)
[address]            [place] [address]          [phone]      Joe Shah [facility] Medical Record #[id number] Date of Birth: 1940-10-22  Referring: Joe Ouch, MD Primary Care: Joe Evener, DO  Chief Complaint:    Chief Complaint  Patient presents with  . Aortic Stenosis    Referral by Dr Joe Shah for eval on AVR, cath'ed 07/29/11, Echo 07/27/11  . Aortic Insuffiency    History of Present Illness:    71 yo sent for consideration of AVR. Has had known aortic murmur for 8-10 years first noted on DOT driver exam. Now having increasing SOB with exertion, angina when climbing stairs usually 2-3 flights, mild lightlessness but no syncope, mild ankle edema. No history of MI       Current Activity/ Functional Status: Patient is} independent with mobility/ambulation, transfers, ADL's, IADL's.   Past Medical History  Diagnosis Date  . Aortic valve disorders   . Other and unspecified hyperlipidemia   . Other B-complex deficiencies   . Unspecified essential hypertension   . Microscopic hematuria     Past Surgical History  Procedure Date  . Knee arthroscopy   . Thyroidectomy, partial   . Lesions removal left chest and left forearm 10/02/1994  . Cardiac catheterization 2008    moderate AS. Normal coronary arteries except for mild plaque in LAD    Family History  Problem Relation Age of Onset  . Brain cancer Father     died 48  . Heart failure Mother     died 38    History   Social History  . Marital Status: Married    Spouse Name: Joe Shah    Number of Children: 3  . Years of Education: N/A   Occupational History  . [employer] retired    Hospital doctor    Social History Main Topics  . Smoking status: Former Smoker -- 0.5 packs/day for 35 years    Types: Cigarettes    Quit date: 08/03/1990  . Smokeless tobacco: Current User    Types: Snuff   Comment: dipping snuff 15 years  . Alcohol Use: No  . Drug  Use: No      History  Smoking status  . Former Smoker -- 0.5 packs/day for 35 years  . Types: Cigarettes  . Quit date: 08/03/1990  Smokeless tobacco  . Current User  . Types: Snuff  Comment: dipping snuff 15 years    History  Alcohol Use No     No Known Allergies  Current Outpatient Prescriptions  Medication Sig Dispense Refill  . aspirin EC 81 MG tablet Take 81 mg by mouth daily.        Marland Kitchen glucosamine-chondroitin 500-400 MG tablet Take 2 tablets by mouth daily.        . meloxicam (MOBIC) 15 MG tablet Take 15 mg by mouth daily.        . quinapril (ACCUPRIL) 40 MG tablet Take 40 mg by mouth daily.        . simvastatin (ZOCOR) 20 MG tablet Take 20 mg by mouth at bedtime.             Review of Systems:     Cardiac Review of Systems: Y or N  Chest Pain [ y   ]  Resting  SOB [  n ] Exertional SOB  Cove.Etienne  ]  Orthopnea [ n ]   Pedal Edema [ y  ]    Palpitations [n  ] Syncope  [ n ]   Presyncope [ nn  ]  General Review of Systems: [Y] = yes [  ]=no Constitional: recent weight change [  ]; anorexia [  ]; fatigue [  ]; nausea [  ]; night sweats [  ]; fever [  ]; or chills [  ];                                                                                                                                          Dental: poor dentition[ n ];   Upper and lower plates  Eye : blurred vision [  ]; diplopia [   ]; vision changes [  ];  Amaurosis fugax[  ]; Resp: cough Cove.Etienne  ];  wheezing[ n ];  hemoptysis[n  ]; shortness of breath[  ]; paroxysmal nocturnal dyspnea[  ]; dyspnea on exertion[  ]; or orthopnea[  ];  GI:  gallstones[  ], vomiting[  ];  dysphagia[  ]; melena[  ];  hematochezia [  ]; heartburn[  ];   Hx of  Colonoscopy[ y ]; 4 years ago GU: kidney stones [  ]; hematuria[ n ];   dysuria [  ];  nocturia[  ];  history of     obstruction [  ];             Skin: rash, swelling[  ];, hair loss[  ];  peripheral edema[y  ];  or itching[  ]; Musculosketetal: myalgias[  ];  joint swelling[  ];   joint erythema[  ];  joint pain[  ];  back pain[  ];  Heme/Lymph: bruising[  ];  bleeding[  ];  anemia[  ];  Neuro: TIA[  ];  headaches[  ];  stroke[  ];  vertigo[  ];  seizures[  ];   paresthesias[  ];  difficulty walking[  ];  Psych:depression[  ]; anxiety[  ];  Endocrine: diabetes[  ];  thyroid dysfunction[  ];  Immunizations: Flu [ y ]; Pneumococcal[ n ];  Other: heard of hearing  Physical Exam: BP 144/87  Pulse 78  Resp 20  Ht 5\' 9"  (1.753 m)  Wt 180 lb (81.647 kg)  BMI 26.58 kg/m2  SpO2 94%  General appearance: alert, cooperative, appears stated age and no distress Neurologic: intact bil carotid bruits radiating from chest Heart: regular rate and rhythm, 4/6 holosystolic murmur through precordium, no murmur, click, rub or gallop Lungs: rhonchi bilaterally Abdomen: soft, non-tender; bowel sounds normal; no masses,  no organomegaly Extremities: extremities normal, atraumatic, no cyanosis or edema, Homans sign is negative, no sign of DVT, no edema, redness or tenderness in the calves or thighs and no ulcers, gangrene or trophic changes Palpable pedal  pulses No palpable AAA  Diagnostic Studies & Laboratory data:     Recent Radiology Findings:   No results found.    Recent Lab Findings: Lab Results  Component Value Date   WBC 4.9 05/08/2008   HGB 13.1 05/08/2008   HCT 40.1 05/08/2008   PLT 205 05/08/2008   GLUCOSE 100* 05/08/2008   ALT 24 05/08/2008   AST 22 05/08/2008   NA 145 05/08/2008   K 4.3 05/08/2008   CL 108 05/08/2008   CREATININE 1.02 05/08/2008   BUN 18 05/08/2008   CO2 27 05/08/2008   INR 1.0 05/08/2008   Echo results scanned from [facility] AV vmax 4.75 m/sec, EF 55%, impaired relaxation, AVA by VTI .92    Cardiac Catheterization Procedure Note  Name: Joe Shah  MRN: [id number]  DOB: 07-02-1941  Procedure: Right Heart Cath, Selective Coronary Angiography.  Indication: This is a 71 year old male who presented with progressive symptoms of exertional chest  tightness. He was known to have moderate aortic stenosis but has not been seen since 2008. I referred him for an echocardiogram which was done at [facility]. It showed normal LV systolic function with an estimated ejection fraction of 55-60%, mild LVH, mildly dilated left atrium and evidence of severe aortic stenosis with a mean gradient of 53 mm mercury and a calculated valve area of 0.92. Cardiac catheterization was recommended in anticipation of aortic valve replacement.  Procedural Details: The right groin was prepped, draped, and anesthetized with 1% lidocaine. Using the modified Seldinger technique a 4 French sheath was placed in the right femoral artery and a 7 French sheath was placed in the right femoral vein. A Swan-Ganz catheter was used for the right heart catheterization. Standard protocol was followed for recording of right heart pressures and sampling of oxygen saturations. Fick cardiac output was calculated. Standard Judkins catheters were used for selective coronary angiography. I did not attempt to cross the aortic valve which is known to have severe stenosis based on echocardiogram. There were no immediate procedural complications. The patient was transferred to the post catheterization recovery area for further monitoring.  Procedural Findings:  Hemodynamics  RA 4 mmHg  RV 30/2 mmHg  PA 26/6/14 mmHg  PCWP 6 mmHg  AO 94/59/74 mmHg  Oxygen saturations:  PA 61  AO 89  Cardiac Output (Fick) 5.2  Cardiac Index (Fick) 2.6  Pulmonary vascular resistance (PVR): 1.5 Woods units.  Coronary angiography:  On fluoroscopy, the aortic valve appears to be severely calcified.  Coronary dominance: Left  Left Main: The vessel is normal in size and free of significant disease.  Left Anterior Descending (LAD): The vessel is normal in size with minor irregularities without evidence of obstructive disease. Mild calcifications are noted in the proximal segment with 20% tubular stenosis.  1st  diagonal (D1): Normal in size and free of significant disease.  2nd diagonal (D2): Normal in size and free of significant disease.  3rd diagonal (D3): Very small in size.  Circumflex (LCx): Large in size and dominant. The vessel has minor irregularities without evidence of obstructive disease.  1st obtuse marginal: Very small in size.  2nd obtuse marginal: Large in size and free of significant disease. There is 30% proximal stenosis.  3rd obtuse marginal: Small in size and free of significant disease.  The AV groove artery is large in size without significant obstructive disease. It gives a normal size PDA and 2 posterolateral branches which are all free of significant disease.  Right Coronary Artery: The  vessel is small in size and nondominant. It is free of significant disease. Left ventriculography: Was not performed.  Final Conclusions:  1. Normal filling pressures without significant pulmonary hypertension. Normal cardiac output.  2. mild nonobstructive coronary artery disease.  3. Severe aortic stenosis by echocardiogram with a mean gradient of 53 mm mercury and a valve area of 0.92.  Recommendations: The patient has symptomatic severe aortic stenosis without significant coronary artery disease. Aortic valve replacement is recommended. I will request a surgical consult.  Lorine Bears MD, [facility]  07/29/2011, 10:42 AM     Assessment / Plan:   Symptomatic critical AS Current Upper Respiratory Symptoms respiratory infection I have discussed AVR with patient and his daughter. With his age tissue valve is recommended. He is recovering from respiratory /bromnchitis infection with some residual coughing fits. Will plan surgery in about two weeks, I will see him on one week to evsure symptoms have cleared before proceeding with AVR       Delight Ovens MD  Beeper [phone] Office [phone]

## 2011-08-05 ENCOUNTER — Telehealth: Payer: Self-pay | Admitting: *Deleted

## 2011-08-05 DIAGNOSIS — I359 Nonrheumatic aortic valve disorder, unspecified: Secondary | ICD-10-CM

## 2011-08-05 NOTE — Telephone Encounter (Signed)
Pt was seen the Friday after Christmas per [facility].

## 2011-08-05 NOTE — Telephone Encounter (Signed)
Message copied by Arlyss Gandy on Wed Aug 05, 2011 11:27 AM ------      Message from: Lorine Bears A      Created: Tue Aug 04, 2011  5:15 PM       This patient should be seen by cardiothoracic surgery ASAP for aortic valve replacement. I already spoke with [facility] and requested an appointment but I don't see a future appointment. Can you check and find out when are they going to see him?

## 2011-08-07 ENCOUNTER — Other Ambulatory Visit: Payer: Self-pay

## 2011-08-07 ENCOUNTER — Telehealth: Payer: Self-pay

## 2011-08-07 ENCOUNTER — Encounter (HOSPITAL_COMMUNITY): Payer: Self-pay

## 2011-08-07 DIAGNOSIS — I359 Nonrheumatic aortic valve disorder, unspecified: Secondary | ICD-10-CM

## 2011-08-07 NOTE — Telephone Encounter (Signed)
08/05/11 I spoke with the patient and per Dr. Dennie Maizes request, told the patient to discontinue taking his Mobic starting with his 08/06/11 dose.

## 2011-08-11 ENCOUNTER — Ambulatory Visit (INDEPENDENT_AMBULATORY_CARE_PROVIDER_SITE_OTHER): Payer: Medicare Other | Admitting: Cardiothoracic Surgery

## 2011-08-11 ENCOUNTER — Encounter: Payer: Self-pay | Admitting: Cardiothoracic Surgery

## 2011-08-11 VITALS — BP 116/68 | HR 82 | Resp 16 | Ht 69.0 in | Wt 180.0 lb

## 2011-08-11 DIAGNOSIS — I359 Nonrheumatic aortic valve disorder, unspecified: Secondary | ICD-10-CM

## 2011-08-11 NOTE — Progress Notes (Signed)
[address]            [place] [address]          [phone]      KWAMANE WHACK [facility] Medical Record #[id number] Date of Birth: 1940/09/29  Referring: [name], MD Primary Care: Samuel Jester DO, DO  Chief Complaint:  Aortic stenosis  History of Present Illness:      This is a followup visit for Mr. Hartsell. He was seen for consideration for aortic valve replacement 10 days ago. At that time we had decided on proceeding with aortic valve replacement but both he and his wife had had significant respiratory infections and flu like symptoms with cough. We decided to delay surgery until next week. He comes in the office today for followup exam. Since last seen he's had no fever chills his cough and respiratory symptoms have completely resolved. No one in his family is currently ill.    Past Medical History  Diagnosis Date  . Aortic valve disorders   . Other and unspecified hyperlipidemia   . Other B-complex deficiencies   . Unspecified essential hypertension   . Microscopic hematuria      History  Smoking status  . Former Smoker -- 0.5 packs/day for 35 years  . Types: Cigarettes  . Quit date: 08/03/1990  Smokeless tobacco  . Current User  . Types: Snuff  Comment: dipping snuff 15 years    History  Alcohol Use No     No Known Allergies  Current Outpatient Prescriptions  Medication Sig Dispense Refill  . aspirin EC 81 MG tablet Take 81 mg by mouth daily.        Marland Kitchen glucosamine-chondroitin 500-400 MG tablet Take 2 tablets by mouth daily.        . quinapril (ACCUPRIL) 40 MG tablet Take 40 mg by mouth daily.        . simvastatin (ZOCOR) 20 MG tablet Take 20 mg by mouth at bedtime.        . meloxicam (MOBIC) 15 MG tablet Take 15 mg by mouth daily.             Physical Exam: BP 116/68  Pulse 82  Resp 16  Ht 5\' 9"  (1.753 m)  Wt 180 lb (81.647 kg)  BMI 26.58 kg/m2  SpO2 93%  General appearance: alert,  cooperative and no distress Neurologic: intact Heart: systolic murmur: holosystolic 3/6, crescendo throughout the precordium Lungs: clear to auscultation bilaterally Abdomen: soft, non-tender; bowel sounds normal; no masses,  no organomegaly Extremities: extremities normal, atraumatic, no cyanosis or edema, Homans sign is negative, no sign of DVT, no edema, redness or tenderness in the calves or thighs and no ulcers, gangrene or trophic changes   Diagnostic Studies & Laboratory data:     Recent Radiology Findings:   No results found.    Recent Lab Findings: Lab Results  Component Value Date   WBC 4.9 05/08/2008   HGB 13.1 05/08/2008   HCT 40.1 05/08/2008   PLT 205 05/08/2008   GLUCOSE 100* 05/08/2008   ALT 24 05/08/2008   AST 22 05/08/2008   NA 145 05/08/2008   K 4.3 05/08/2008   CL 108 05/08/2008   CREATININE 1.02 05/08/2008   BUN 18 05/08/2008   CO2 27 05/08/2008   INR 1.0 05/08/2008      Assessment / Plan:     The patient  appears to recovered from his respiratory illness. The plan to proceed with aortic valve replacement next week as previously outlined. With his age and because of his preference we will use a tissue valve. The patient's questions have been answered in detail and he is willing to proceed. The surgical risks were again reviewed with him as they were on his last visit. We plan to proceed on January 15.      Delight Ovens MD  Beeper [phone] Office [phone] 08/11/2011 5:34 PM

## 2011-08-14 ENCOUNTER — Inpatient Hospital Stay (HOSPITAL_COMMUNITY)
Admission: RE | Admit: 2011-08-14 | Discharge: 2011-08-14 | Disposition: A | Discharge status: Home / Self Care | Payer: Medicare Other | Source: Ambulatory Visit | Attending: Cardiothoracic Surgery | Admitting: Cardiothoracic Surgery

## 2011-08-14 ENCOUNTER — Encounter (HOSPITAL_COMMUNITY): Payer: Self-pay

## 2011-08-14 ENCOUNTER — Encounter (HOSPITAL_COMMUNITY)
Admission: RE | Admit: 2011-08-14 | Discharge: 2011-08-14 | Disposition: A | Discharge status: Home / Self Care | Payer: Medicare Other | Source: Ambulatory Visit | Attending: Cardiothoracic Surgery | Admitting: Cardiothoracic Surgery

## 2011-08-14 ENCOUNTER — Other Ambulatory Visit: Payer: Self-pay

## 2011-08-14 ENCOUNTER — Ambulatory Visit (HOSPITAL_COMMUNITY)
Admission: RE | Admit: 2011-08-14 | Discharge: 2011-08-14 | Disposition: A | Discharge status: Home / Self Care | Payer: Medicare Other | Source: Ambulatory Visit | Attending: Cardiothoracic Surgery | Admitting: Cardiothoracic Surgery

## 2011-08-14 DIAGNOSIS — M47814 Spondylosis without myelopathy or radiculopathy, thoracic region: Secondary | ICD-10-CM | POA: Insufficient documentation

## 2011-08-14 DIAGNOSIS — I359 Nonrheumatic aortic valve disorder, unspecified: Secondary | ICD-10-CM

## 2011-08-14 DIAGNOSIS — Z01810 Encounter for preprocedural cardiovascular examination: Secondary | ICD-10-CM

## 2011-08-14 DIAGNOSIS — Z01818 Encounter for other preprocedural examination: Secondary | ICD-10-CM | POA: Insufficient documentation

## 2011-08-14 DIAGNOSIS — M4120 Other idiopathic scoliosis, site unspecified: Secondary | ICD-10-CM | POA: Insufficient documentation

## 2011-08-14 HISTORY — DX: Attention-deficit hyperactivity disorder, unspecified type: F90.9

## 2011-08-14 HISTORY — DX: Unspecified injury of head, initial encounter: S09.90XA

## 2011-08-14 HISTORY — DX: Scoliosis, unspecified: M41.9

## 2011-08-14 HISTORY — DX: Frequency of micturition: R35.0

## 2011-08-14 HISTORY — DX: Hyperlipidemia, unspecified: E78.5

## 2011-08-14 HISTORY — DX: Nonrheumatic aortic (valve) stenosis: I35.0

## 2011-08-14 HISTORY — DX: Unspecified osteoarthritis, unspecified site: M19.90

## 2011-08-14 LAB — BLOOD GAS, ARTERIAL
Acid-Base Excess: 1.1 mmol/L (ref 0.0–2.0)
Bicarbonate: 24.8 mEq/L — ABNORMAL HIGH (ref 20.0–24.0)
Drawn by: 344381
FIO2: 0.21 %
O2 Saturation: 92.7 %
Patient temperature: 98.6
TCO2: 25.9 mmol/L (ref 0–100)
pCO2 arterial: 36.7 mmHg (ref 35.0–45.0)
pH, Arterial: 7.444 (ref 7.350–7.450)
pO2, Arterial: 65.3 mmHg — ABNORMAL LOW (ref 80.0–100.0)

## 2011-08-14 LAB — COMPREHENSIVE METABOLIC PANEL
ALT: 14 U/L (ref 0–53)
AST: 19 U/L (ref 0–37)
Albumin: 3.7 g/dL (ref 3.5–5.2)
Alkaline Phosphatase: 66 U/L (ref 39–117)
BUN: 29 mg/dL — ABNORMAL HIGH (ref 6–23)
CO2: 23 mEq/L (ref 19–32)
Calcium: 9.5 mg/dL (ref 8.4–10.5)
Chloride: 107 mEq/L (ref 96–112)
Creatinine, Ser: 0.98 mg/dL (ref 0.50–1.35)
GFR calc Af Amer: >90 mL/min (ref >90)
GFR calc non Af Amer: 81 mL/min — ABNORMAL LOW (ref >90)
Glucose, Bld: 114 mg/dL — ABNORMAL HIGH (ref 70–99)
Potassium: 4.4 mEq/L (ref 3.5–5.1)
Sodium: 142 mEq/L (ref 135–145)
Total Bilirubin: 0.4 mg/dL (ref 0.3–1.2)
Total Protein: 6.4 g/dL (ref 6.0–8.3)

## 2011-08-14 LAB — APTT: aPTT: 30 seconds (ref 24–37)

## 2011-08-14 LAB — URINALYSIS, ROUTINE W REFLEX MICROSCOPIC
Bilirubin Urine: NEGATIVE
Glucose, UA: NEGATIVE mg/dL
Ketones, ur: NEGATIVE mg/dL
Leukocytes, UA: NEGATIVE
Nitrite: NEGATIVE
Protein, ur: NEGATIVE mg/dL
Specific Gravity, Urine: 1.019 (ref 1.005–1.030)
Urobilinogen, UA: 1 mg/dL (ref 0.0–1.0)
pH: 5.5 (ref 5.0–8.0)

## 2011-08-14 LAB — PULMONARY FUNCTION TEST

## 2011-08-14 LAB — PROTIME-INR
INR: 1.16 (ref 0.00–1.49)
Prothrombin Time: 15 seconds (ref 11.6–15.2)

## 2011-08-14 LAB — ABO/RH: ABO/RH(D): A POS

## 2011-08-14 LAB — TYPE AND SCREEN
ABO/RH(D): A POS
Antibody Screen: NEGATIVE

## 2011-08-14 LAB — SURGICAL PCR SCREEN
MRSA, PCR: NEGATIVE
Staphylococcus aureus: NEGATIVE

## 2011-08-14 LAB — URINE MICROSCOPIC-ADD ON

## 2011-08-14 LAB — CBC
HCT: 39.4 % (ref 39.0–52.0)
Hemoglobin: 13.3 g/dL (ref 13.0–17.0)
MCH: 27.5 pg (ref 26.0–34.0)
MCHC: 33.8 g/dL (ref 30.0–36.0)
MCV: 81.4 fL (ref 78.0–100.0)
Platelets: 237 10*3/uL (ref 150–400)
RBC: 4.84 MIL/uL (ref 4.22–5.81)
RDW: 14.1 % (ref 11.5–15.5)
WBC: 6.9 10*3/uL (ref 4.0–10.5)

## 2011-08-14 LAB — HEMOGLOBIN A1C
Hgb A1c MFr Bld: 5.7 % — ABNORMAL HIGH (ref <5.7)
Mean Plasma Glucose: 117 mg/dL — ABNORMAL HIGH (ref <117)

## 2011-08-14 MED ORDER — CHLORHEXIDINE GLUCONATE 4 % EX LIQD: 30.0000 mL | CUTANEOUS | Status: DC

## 2011-08-14 NOTE — Pre-Procedure Instructions (Signed)
20 SHADOW SCHEDLER  08/14/2011  Your procedure is scheduled on:  Tues,Jan 15 @ 0715  Report to [facility] at 0530 AM.  Call this number if you have problems the morning of surgery: [phone]   Remember:   Do not eat food:After Midnight.  May have clear liquids: up to 4 Hours before arrival.(until 1:30am)  Clear liquids include soda, tea, black coffee, apple or grape juice, broth.  Take these medicines the morning of surgery with A SIP OF WATER:    Do not wear jewelry, make-up or nail polish.  Do not wear lotions, powders, or perfumes. You may wear deodorant.  Do not shave 48 hours prior to surgery.  Do not bring valuables to the hospital.  Contacts, dentures or bridgework may not be worn into surgery.  Leave suitcase in the car. After surgery it may be brought to your room.  For patients admitted to the hospital, checkout time is 11:00 AM the day of discharge.   Patients discharged the day of surgery will not be allowed to drive home.  Name and phone number of your driver:   Special Instructions: CHG Shower Use Special Wash: 1/2 bottle night before surgery and 1/2 bottle morning of surgery.   Please read over the following fact sheets that you were given: Pain Booklet, Coughing and Deep Breathing, Blood Transfusion Information, Open Heart Packet, MRSA Information and Surgical Site Infection Prevention

## 2011-08-14 NOTE — Progress Notes (Signed)
Verified Dopplers to be done @ 1100 PFT @ 1300 today

## 2011-08-14 NOTE — Progress Notes (Signed)
Cardiologist is Dr.Arida  Echo in epic but pt states that another was done 3wks ago at [facility]-to request records Stress test in 11/2006-results in epic

## 2011-08-17 MED ORDER — SODIUM CHLORIDE 0.9 % IV SOLN
INTRAVENOUS | Status: DC
  Filled 2011-08-17: qty 1

## 2011-08-17 MED ORDER — MAGNESIUM SULFATE 50 % IJ SOLN
40.0000 meq | INTRAMUSCULAR | Status: DC
  Filled 2011-08-17: qty 10

## 2011-08-17 MED ORDER — PLASMA-LYTE 148 IV SOLN
INTRAVENOUS | Status: DC
  Filled 2011-08-17: qty 2.5

## 2011-08-17 MED ORDER — DEXTROSE 5 % IV SOLN
750.0000 mg | INTRAVENOUS | Status: DC
  Filled 2011-08-17: qty 750

## 2011-08-17 MED ORDER — EPINEPHRINE HCL 1 MG/ML IJ SOLN
0.5000 ug/min | INTRAVENOUS | Status: DC
  Filled 2011-08-17: qty 4

## 2011-08-17 MED ORDER — DOPAMINE-DEXTROSE 3.2-5 MG/ML-% IV SOLN: 2.0000 ug/kg/min | INTRAVENOUS | Status: DC

## 2011-08-17 MED ORDER — DOPAMINE-DEXTROSE 3.2-5 MG/ML-% IV SOLN
2.0000 ug/kg/min | INTRAVENOUS | Status: DC
  Filled 2011-08-17 (×2): qty 250

## 2011-08-17 MED ORDER — SODIUM CHLORIDE 0.9 % IV SOLN: 0.1000 ug/kg/h | INTRAVENOUS | Status: AC

## 2011-08-17 MED ORDER — METOPROLOL TARTRATE 12.5 MG HALF TABLET
12.5000 mg | ORAL_TABLET | Freq: Once | ORAL | Status: AC
  Administered 2011-08-18: 12.5 mg via ORAL
  Filled 2011-08-17: qty 1

## 2011-08-17 MED ORDER — DOPAMINE-DEXTROSE 3.2-5 MG/ML-% IV SOLN
2.0000 ug/kg/min | INTRAVENOUS | Status: DC
  Filled 2011-08-17: qty 250

## 2011-08-17 MED ORDER — NITROGLYCERIN IN D5W 200-5 MCG/ML-% IV SOLN
2.0000 ug/min | INTRAVENOUS | Status: DC
  Filled 2011-08-17: qty 250

## 2011-08-17 MED ORDER — SODIUM CHLORIDE 0.9 % IV SOLN
INTRAVENOUS | Status: DC
  Filled 2011-08-17: qty 40

## 2011-08-17 MED ORDER — SODIUM CHLORIDE 0.9 % IV SOLN
0.1000 ug/kg/h | INTRAVENOUS | Status: DC
  Filled 2011-08-17: qty 4

## 2011-08-17 MED ORDER — POTASSIUM CHLORIDE 2 MEQ/ML IV SOLN
80.0000 meq | INTRAVENOUS | Status: DC
  Filled 2011-08-17: qty 40

## 2011-08-17 MED ORDER — PHENYLEPHRINE HCL 10 MG/ML IJ SOLN
30.0000 ug/min | INTRAMUSCULAR | Status: DC
  Filled 2011-08-17: qty 2

## 2011-08-17 MED ORDER — DEXTROSE 5 % IV SOLN
1.5000 g | INTRAVENOUS | Status: AC
  Administered 2011-08-18: 1.5 g via INTRAVENOUS
  Filled 2011-08-17: qty 1.5

## 2011-08-17 MED ORDER — DOPAMINE-DEXTROSE 3.2-5 MG/ML-% IV SOLN: 2.0000 ug/kg/min | INTRAVENOUS | Status: AC

## 2011-08-17 MED ORDER — VANCOMYCIN HCL 1000 MG IV SOLR
1250.0000 mg | INTRAVENOUS | Status: AC
  Administered 2011-08-18: 1250 mg via INTRAVENOUS
  Filled 2011-08-17: qty 1250

## 2011-08-17 NOTE — Consult Note (Signed)
Anesthesia:  Patient is a 71 year old male scheduled for an AVR on 08/18/11.  His other history includes HLD, HTN, scoliosis, ADD, and former smoker.  Labs noted.  CXR shows thoracic scoliosis and spondylosis and bilateral glenohumeral arthropathy.   EKG shows NSR, LAD, possible inferior infarct.  Cardiologist is Dr. [name] at [facility].  He had recent cardiac work-up.  Cath done on 07/29/11 (See Notes tab, Op Note) showed: 1. Normal filling pressures without significant pulmonary hypertension. Normal cardiac output.  2. mild nonobstructive coronary artery disease.  3. Severe aortic stenosis by echocardiogram with a mean gradient of 53 mm mercury and a valve area of 0.92.   Echo done at [facility] from 07/27/11 (See Results Review tab) scanned into Epic shows normal LV systolic function, EF 55-60%, mild LVH, LA mildly dilated, severe AS, mean gradient of the AV is 53 mmHG, AV area, by VTI's calculated at 0.92 cm2, trace MR/TR, RV systolic pressure calculated at 42 mmHG, mild pulmonary HTN.  Plan to proceed.

## 2011-08-18 ENCOUNTER — Inpatient Hospital Stay (HOSPITAL_COMMUNITY)
Admission: RE | Admit: 2011-08-18 | Discharge: 2011-08-22 | DRG: 220 | Disposition: A | Discharge status: Home / Self Care | Payer: Medicare Other | Source: Ambulatory Visit | Attending: Cardiothoracic Surgery | Admitting: Cardiothoracic Surgery

## 2011-08-18 ENCOUNTER — Ambulatory Visit (HOSPITAL_COMMUNITY): Payer: Medicare Other | Admitting: Vascular Surgery

## 2011-08-18 ENCOUNTER — Inpatient Hospital Stay (HOSPITAL_COMMUNITY): Payer: Medicare Other

## 2011-08-18 ENCOUNTER — Encounter (HOSPITAL_COMMUNITY)
Admission: RE | Disposition: A | Discharge status: Home / Self Care | Payer: Self-pay | Source: Ambulatory Visit | Attending: Cardiothoracic Surgery

## 2011-08-18 ENCOUNTER — Other Ambulatory Visit: Payer: Self-pay | Admitting: Cardiothoracic Surgery

## 2011-08-18 ENCOUNTER — Encounter (HOSPITAL_COMMUNITY): Payer: Self-pay | Admitting: *Deleted

## 2011-08-18 ENCOUNTER — Encounter (HOSPITAL_COMMUNITY): Payer: Self-pay | Admitting: Vascular Surgery

## 2011-08-18 DIAGNOSIS — I10 Essential (primary) hypertension: Secondary | ICD-10-CM | POA: Diagnosis present

## 2011-08-18 DIAGNOSIS — D62 Acute posthemorrhagic anemia: Secondary | ICD-10-CM | POA: Diagnosis not present

## 2011-08-18 DIAGNOSIS — Z952 Presence of prosthetic heart valve: Secondary | ICD-10-CM

## 2011-08-18 DIAGNOSIS — I4891 Unspecified atrial fibrillation: Secondary | ICD-10-CM | POA: Diagnosis present

## 2011-08-18 DIAGNOSIS — M129 Arthropathy, unspecified: Secondary | ICD-10-CM | POA: Diagnosis present

## 2011-08-18 DIAGNOSIS — F909 Attention-deficit hyperactivity disorder, unspecified type: Secondary | ICD-10-CM | POA: Diagnosis present

## 2011-08-18 DIAGNOSIS — M4120 Other idiopathic scoliosis, site unspecified: Secondary | ICD-10-CM | POA: Diagnosis present

## 2011-08-18 DIAGNOSIS — I359 Nonrheumatic aortic valve disorder, unspecified: Secondary | ICD-10-CM

## 2011-08-18 DIAGNOSIS — E785 Hyperlipidemia, unspecified: Secondary | ICD-10-CM | POA: Diagnosis present

## 2011-08-18 HISTORY — PX: AORTIC VALVE REPLACEMENT: SHX41

## 2011-08-18 LAB — CBC
HCT: 30.5 % — ABNORMAL LOW (ref 39.0–52.0)
HCT: 32.1 % — ABNORMAL LOW (ref 39.0–52.0)
Hemoglobin: 10.3 g/dL — ABNORMAL LOW (ref 13.0–17.0)
Hemoglobin: 10.5 g/dL — ABNORMAL LOW (ref 13.0–17.0)
MCH: 27.2 pg (ref 26.0–34.0)
MCH: 26.5 pg (ref 26.0–34.0)
MCHC: 33.8 g/dL (ref 30.0–36.0)
MCHC: 32.7 g/dL (ref 30.0–36.0)
MCV: 80.5 fL (ref 78.0–100.0)
MCV: 81.1 fL (ref 78.0–100.0)
Platelets: 123 10*3/uL — ABNORMAL LOW (ref 150–400)
Platelets: 130 10*3/uL — ABNORMAL LOW (ref 150–400)
RBC: 3.79 MIL/uL — ABNORMAL LOW (ref 4.22–5.81)
RBC: 3.96 MIL/uL — ABNORMAL LOW (ref 4.22–5.81)
RDW: 13.9 % (ref 11.5–15.5)
RDW: 13.9 % (ref 11.5–15.5)
WBC: 8.9 10*3/uL (ref 4.0–10.5)
WBC: 7.1 10*3/uL (ref 4.0–10.5)

## 2011-08-18 LAB — POCT I-STAT, CHEM 8
BUN: 22 mg/dL (ref 6–23)
Calcium, Ion: 1.22 mmol/L (ref 1.12–1.32)
Chloride: 106 mEq/L (ref 96–112)
Creatinine, Ser: 0.9 mg/dL (ref 0.50–1.35)
Glucose, Bld: 111 mg/dL — ABNORMAL HIGH (ref 70–99)
HCT: 30 % — ABNORMAL LOW (ref 39.0–52.0)
Hemoglobin: 10.2 g/dL — ABNORMAL LOW (ref 13.0–17.0)
Potassium: 4.3 mEq/L (ref 3.5–5.1)
Sodium: 140 mEq/L (ref 135–145)
TCO2: 22 mmol/L (ref 0–100)

## 2011-08-18 LAB — HEMOGLOBIN AND HEMATOCRIT, BLOOD
HCT: 22.9 % — ABNORMAL LOW (ref 39.0–52.0)
Hemoglobin: 7.6 g/dL — ABNORMAL LOW (ref 13.0–17.0)

## 2011-08-18 LAB — POCT I-STAT 3, ART BLOOD GAS (G3+)
Acid-base deficit: 3 mmol/L — ABNORMAL HIGH (ref 0.0–2.0)
Acid-base deficit: 3 mmol/L — ABNORMAL HIGH (ref 0.0–2.0)
Acid-base deficit: 2 mmol/L (ref 0.0–2.0)
Bicarbonate: 25.2 mEq/L — ABNORMAL HIGH (ref 20.0–24.0)
Bicarbonate: 22.1 mEq/L (ref 20.0–24.0)
Bicarbonate: 22.8 mEq/L (ref 20.0–24.0)
Bicarbonate: 23.2 mEq/L (ref 20.0–24.0)
O2 Saturation: 100 %
O2 Saturation: 99 %
O2 Saturation: 97 %
O2 Saturation: 98 %
Patient temperature: 36
Patient temperature: 36.8
Patient temperature: 37.2
TCO2: 26 mmol/L (ref 0–100)
TCO2: 23 mmol/L (ref 0–100)
TCO2: 24 mmol/L (ref 0–100)
TCO2: 24 mmol/L (ref 0–100)
pCO2 arterial: 40.1 mmHg (ref 35.0–45.0)
pCO2 arterial: 38 mmHg (ref 35.0–45.0)
pCO2 arterial: 41.2 mmHg (ref 35.0–45.0)
pCO2 arterial: 40.2 mmHg (ref 35.0–45.0)
pH, Arterial: 7.406 (ref 7.350–7.450)
pH, Arterial: 7.368 (ref 7.350–7.450)
pH, Arterial: 7.35 (ref 7.350–7.450)
pH, Arterial: 7.371 (ref 7.350–7.450)
pO2, Arterial: 352 mmHg — ABNORMAL HIGH (ref 80.0–100.0)
pO2, Arterial: 133 mmHg — ABNORMAL HIGH (ref 80.0–100.0)
pO2, Arterial: 94 mmHg (ref 80.0–100.0)
pO2, Arterial: 115 mmHg — ABNORMAL HIGH (ref 80.0–100.0)

## 2011-08-18 LAB — POCT I-STAT 4, (NA,K, GLUC, HGB,HCT)
Glucose, Bld: 107 mg/dL — ABNORMAL HIGH (ref 70–99)
Glucose, Bld: 109 mg/dL — ABNORMAL HIGH (ref 70–99)
Glucose, Bld: 122 mg/dL — ABNORMAL HIGH (ref 70–99)
Glucose, Bld: 122 mg/dL — ABNORMAL HIGH (ref 70–99)
Glucose, Bld: 144 mg/dL — ABNORMAL HIGH (ref 70–99)
Glucose, Bld: 118 mg/dL — ABNORMAL HIGH (ref 70–99)
HCT: 34 % — ABNORMAL LOW (ref 39.0–52.0)
HCT: 25 % — ABNORMAL LOW (ref 39.0–52.0)
HCT: 30 % — ABNORMAL LOW (ref 39.0–52.0)
HCT: 21 % — ABNORMAL LOW (ref 39.0–52.0)
HCT: 26 % — ABNORMAL LOW (ref 39.0–52.0)
HCT: 30 % — ABNORMAL LOW (ref 39.0–52.0)
Hemoglobin: 11.6 g/dL — ABNORMAL LOW (ref 13.0–17.0)
Hemoglobin: 10.2 g/dL — ABNORMAL LOW (ref 13.0–17.0)
Hemoglobin: 8.5 g/dL — ABNORMAL LOW (ref 13.0–17.0)
Hemoglobin: 7.1 g/dL — ABNORMAL LOW (ref 13.0–17.0)
Hemoglobin: 8.8 g/dL — ABNORMAL LOW (ref 13.0–17.0)
Hemoglobin: 10.2 g/dL — ABNORMAL LOW (ref 13.0–17.0)
Potassium: 4.1 mEq/L (ref 3.5–5.1)
Potassium: 4.2 mEq/L (ref 3.5–5.1)
Potassium: 4.2 mEq/L (ref 3.5–5.1)
Potassium: 5.3 mEq/L — ABNORMAL HIGH (ref 3.5–5.1)
Potassium: 4.7 mEq/L (ref 3.5–5.1)
Potassium: 4.4 mEq/L (ref 3.5–5.1)
Sodium: 139 mEq/L (ref 135–145)
Sodium: 132 mEq/L — ABNORMAL LOW (ref 135–145)
Sodium: 137 mEq/L (ref 135–145)
Sodium: 128 mEq/L — ABNORMAL LOW (ref 135–145)
Sodium: 134 mEq/L — ABNORMAL LOW (ref 135–145)
Sodium: 136 mEq/L (ref 135–145)

## 2011-08-18 LAB — PROTIME-INR
INR: 1.72 — ABNORMAL HIGH (ref 0.00–1.49)
Prothrombin Time: 20.5 seconds — ABNORMAL HIGH (ref 11.6–15.2)

## 2011-08-18 LAB — GLUCOSE, CAPILLARY
Glucose-Capillary: 98 mg/dL (ref 70–99)
Glucose-Capillary: 110 mg/dL — ABNORMAL HIGH (ref 70–99)
Glucose-Capillary: 114 mg/dL — ABNORMAL HIGH (ref 70–99)
Glucose-Capillary: 114 mg/dL — ABNORMAL HIGH (ref 70–99)
Glucose-Capillary: 110 mg/dL — ABNORMAL HIGH (ref 70–99)
Glucose-Capillary: 117 mg/dL — ABNORMAL HIGH (ref 70–99)

## 2011-08-18 LAB — CREATININE, SERUM
Creatinine, Ser: 0.83 mg/dL (ref 0.50–1.35)
GFR calc Af Amer: >90 mL/min (ref >90)
GFR calc non Af Amer: 87 mL/min — ABNORMAL LOW (ref >90)

## 2011-08-18 LAB — MAGNESIUM: Magnesium: 2.8 mg/dL — ABNORMAL HIGH (ref 1.5–2.5)

## 2011-08-18 LAB — APTT: aPTT: 39 seconds — ABNORMAL HIGH (ref 24–37)

## 2011-08-18 LAB — PLATELET COUNT: Platelets: 154 10*3/uL (ref 150–400)

## 2011-08-18 SURGERY — REPLACEMENT, AORTIC VALVE, OPEN
Anesthesia: General | Site: Chest | Wound class: Clean

## 2011-08-18 MED ORDER — SODIUM CHLORIDE 0.9 % IV SOLN
10.0000 g | INTRAVENOUS | Status: DC | PRN
  Administered 2011-08-18: 5 g/h via INTRAVENOUS

## 2011-08-18 MED ORDER — ASPIRIN 81 MG PO CHEW: 324.0000 mg | CHEWABLE_TABLET | Freq: Every day | ORAL | Status: DC

## 2011-08-18 MED ORDER — ACETAMINOPHEN 650 MG RE SUPP
650.0000 mg | RECTAL | Status: AC
  Administered 2011-08-18: 650 mg via RECTAL

## 2011-08-18 MED ORDER — HEMOSTATIC AGENTS (NO CHARGE) OPTIME
TOPICAL | Status: DC | PRN
  Administered 2011-08-18: 1 via TOPICAL

## 2011-08-18 MED ORDER — INSULIN REGULAR BOLUS VIA INFUSION
0.0000 [IU] | Freq: Three times a day (TID) | INTRAVENOUS | Status: DC
  Administered 2011-08-18: 2.3 [IU] via INTRAVENOUS
  Filled 2011-08-18 (×9): qty 10

## 2011-08-18 MED ORDER — SODIUM CHLORIDE 0.9 % IV SOLN
INTRAVENOUS | Status: DC
  Filled 2011-08-18: qty 1

## 2011-08-18 MED ORDER — MIDAZOLAM HCL 5 MG/5ML IJ SOLN
INTRAMUSCULAR | Status: DC | PRN
  Administered 2011-08-18 (×2): 2 mg via INTRAVENOUS
  Administered 2011-08-18: 4 mg via INTRAVENOUS
  Administered 2011-08-18 (×2): 2 mg via INTRAVENOUS
  Administered 2011-08-18: 3 mg via INTRAVENOUS

## 2011-08-18 MED ORDER — METOPROLOL TARTRATE 25 MG/10 ML ORAL SUSPENSION
12.5000 mg | Freq: Two times a day (BID) | ORAL | Status: DC
  Administered 2011-08-18: 12.5 mg
  Filled 2011-08-18 (×5): qty 5

## 2011-08-18 MED ORDER — 0.9 % SODIUM CHLORIDE (POUR BTL) OPTIME
TOPICAL | Status: DC | PRN
  Administered 2011-08-18: 1000 mL

## 2011-08-18 MED ORDER — MORPHINE SULFATE 4 MG/ML IJ SOLN
2.0000 mg | INTRAMUSCULAR | Status: DC | PRN
  Administered 2011-08-18 (×2): 2 mg via INTRAVENOUS
  Administered 2011-08-19: 4 mg via INTRAVENOUS
  Filled 2011-08-18 (×2): qty 1

## 2011-08-18 MED ORDER — METOPROLOL TARTRATE 1 MG/ML IV SOLN
2.5000 mg | INTRAVENOUS | Status: DC | PRN
  Administered 2011-08-18 (×3): 2.5 mg via INTRAVENOUS
  Administered 2011-08-19 – 2011-08-20 (×2): 5 mg via INTRAVENOUS
  Filled 2011-08-18 (×3): qty 5

## 2011-08-18 MED ORDER — PHENYLEPHRINE HCL 10 MG/ML IJ SOLN
20.0000 mg | INTRAVENOUS | Status: DC | PRN
  Administered 2011-08-18: 13 ug/min via INTRAVENOUS

## 2011-08-18 MED ORDER — ASPIRIN EC 325 MG PO TBEC
325.0000 mg | DELAYED_RELEASE_TABLET | Freq: Every day | ORAL | Status: DC
  Administered 2011-08-19: 325 mg via ORAL
  Filled 2011-08-18 (×2): qty 1

## 2011-08-18 MED ORDER — ROCURONIUM BROMIDE 100 MG/10ML IV SOLN
INTRAVENOUS | Status: DC | PRN
  Administered 2011-08-18: 100 mg via INTRAVENOUS

## 2011-08-18 MED ORDER — HEPARIN SODIUM (PORCINE) 1000 UNIT/ML IJ SOLN
INTRAMUSCULAR | Status: DC | PRN
  Administered 2011-08-18: 31000 [IU] via INTRAVENOUS

## 2011-08-18 MED ORDER — ACETAMINOPHEN 500 MG PO TABS
1000.0000 mg | ORAL_TABLET | Freq: Four times a day (QID) | ORAL | Status: DC
  Administered 2011-08-19 – 2011-08-20 (×6): 1000 mg via ORAL
  Filled 2011-08-18 (×9): qty 2

## 2011-08-18 MED ORDER — OXYCODONE HCL 5 MG PO TABS: 5.0000 mg | ORAL_TABLET | ORAL | Status: DC | PRN

## 2011-08-18 MED ORDER — LACTATED RINGERS IV SOLN
INTRAVENOUS | Status: DC | PRN
  Administered 2011-08-18 (×2): via INTRAVENOUS

## 2011-08-18 MED ORDER — ONDANSETRON HCL 4 MG/2ML IJ SOLN: 4.0000 mg | Freq: Four times a day (QID) | INTRAMUSCULAR | Status: DC | PRN

## 2011-08-18 MED ORDER — MORPHINE SULFATE 2 MG/ML IJ SOLN
1.0000 mg | INTRAMUSCULAR | Status: AC | PRN
  Administered 2011-08-18 (×2): 1 mg via INTRAVENOUS
  Filled 2011-08-18: qty 1

## 2011-08-18 MED ORDER — SODIUM CHLORIDE 0.9 % IV SOLN
0.1000 ug/kg/h | INTRAVENOUS | Status: DC
  Administered 2011-08-18: 0.1 ug/kg/h via INTRAVENOUS
  Filled 2011-08-18 (×2): qty 2

## 2011-08-18 MED ORDER — SIMVASTATIN 20 MG PO TABS
20.0000 mg | ORAL_TABLET | Freq: Every day | ORAL | Status: DC
  Administered 2011-08-19: 20 mg via ORAL
  Filled 2011-08-18 (×3): qty 1

## 2011-08-18 MED ORDER — LACTATED RINGERS IV SOLN
INTRAVENOUS | Status: DC | PRN
  Administered 2011-08-18 (×2): via INTRAVENOUS

## 2011-08-18 MED ORDER — NITROGLYCERIN IN D5W 200-5 MCG/ML-% IV SOLN
INTRAVENOUS | Status: DC | PRN
  Administered 2011-08-18: 10 ug/min via INTRAVENOUS

## 2011-08-18 MED ORDER — LACTATED RINGERS IV SOLN
500.0000 mL | Freq: Once | INTRAVENOUS | Status: AC | PRN
  Administered 2011-08-18: 500 mL via INTRAVENOUS

## 2011-08-18 MED ORDER — SODIUM CHLORIDE 0.9 % IV SOLN: INTRAVENOUS | Status: DC

## 2011-08-18 MED ORDER — DOCUSATE SODIUM 100 MG PO CAPS
200.0000 mg | ORAL_CAPSULE | Freq: Every day | ORAL | Status: DC
  Administered 2011-08-19: 200 mg via ORAL
  Filled 2011-08-18: qty 2

## 2011-08-18 MED ORDER — SODIUM CHLORIDE 0.9 % IJ SOLN: 3.0000 mL | INTRAMUSCULAR | Status: DC | PRN

## 2011-08-18 MED ORDER — DEXTROSE 5 % IV SOLN
1.5000 g | Freq: Two times a day (BID) | INTRAVENOUS | Status: DC
  Administered 2011-08-18 – 2011-08-19 (×3): 1.5 g via INTRAVENOUS
  Filled 2011-08-18 (×4): qty 1.5

## 2011-08-18 MED ORDER — PROPOFOL 10 MG/ML IV EMUL
INTRAVENOUS | Status: DC | PRN
  Administered 2011-08-18: 140 mg via INTRAVENOUS

## 2011-08-18 MED ORDER — ALBUMIN HUMAN 5 % IV SOLN
250.0000 mL | INTRAVENOUS | Status: AC | PRN
  Administered 2011-08-18 (×2): 250 mL via INTRAVENOUS

## 2011-08-18 MED ORDER — MAGNESIUM SULFATE 40 MG/ML IJ SOLN
4.0000 g | Freq: Once | INTRAMUSCULAR | Status: AC
  Administered 2011-08-18: 4 g via INTRAVENOUS
  Filled 2011-08-18: qty 100

## 2011-08-18 MED ORDER — NITROGLYCERIN IN D5W 200-5 MCG/ML-% IV SOLN
0.0000 ug/min | INTRAVENOUS | Status: DC
  Filled 2011-08-18 (×2): qty 250

## 2011-08-18 MED ORDER — LACTATED RINGERS IV SOLN
INTRAVENOUS | Status: DC | PRN
  Administered 2011-08-18: 07:00:00 via INTRAVENOUS

## 2011-08-18 MED ORDER — FAMOTIDINE IN NACL 20-0.9 MG/50ML-% IV SOLN
20.0000 mg | Freq: Two times a day (BID) | INTRAVENOUS | Status: AC
  Administered 2011-08-18 – 2011-08-19 (×2): 20 mg via INTRAVENOUS
  Filled 2011-08-18: qty 50

## 2011-08-18 MED ORDER — SODIUM CHLORIDE 0.9 % IV SOLN
250.0000 mL | INTRAVENOUS | Status: DC
  Administered 2011-08-19: 250 mL via INTRAVENOUS

## 2011-08-18 MED ORDER — PROTAMINE SULFATE 10 MG/ML IV SOLN
INTRAVENOUS | Status: DC | PRN
  Administered 2011-08-18: 300 mg via INTRAVENOUS

## 2011-08-18 MED ORDER — VANCOMYCIN HCL 1000 MG IV SOLR
1000.0000 mg | Freq: Once | INTRAVENOUS | Status: AC
  Administered 2011-08-18: 1000 mg via INTRAVENOUS
  Filled 2011-08-18: qty 1000

## 2011-08-18 MED ORDER — ACETAMINOPHEN 160 MG/5ML PO SOLN
975.0000 mg | Freq: Four times a day (QID) | ORAL | Status: DC
  Filled 2011-08-18: qty 40.6

## 2011-08-18 MED ORDER — VANCOMYCIN HCL 1000 MG IV SOLR
1000.0000 mg | INTRAVENOUS | Status: AC
  Administered 2011-08-18: 1000 mg
  Filled 2011-08-18: qty 1000

## 2011-08-18 MED ORDER — ACETAMINOPHEN 160 MG/5ML PO SOLN: 650.0000 mg | ORAL | Status: AC

## 2011-08-18 MED ORDER — METOPROLOL TARTRATE 12.5 MG HALF TABLET
12.5000 mg | ORAL_TABLET | Freq: Two times a day (BID) | ORAL | Status: DC
  Administered 2011-08-19 (×2): 12.5 mg via ORAL
  Filled 2011-08-18 (×6): qty 1

## 2011-08-18 MED ORDER — PHENYLEPHRINE HCL 10 MG/ML IJ SOLN
10.0000 mg | INTRAVENOUS | Status: DC | PRN
  Administered 2011-08-18: 1 ug/min via INTRAVENOUS

## 2011-08-18 MED ORDER — FENTANYL CITRATE 0.05 MG/ML IJ SOLN
INTRAMUSCULAR | Status: DC | PRN
  Administered 2011-08-18: 100 ug via INTRAVENOUS
  Administered 2011-08-18: 150 ug via INTRAVENOUS
  Administered 2011-08-18 (×4): 250 ug via INTRAVENOUS

## 2011-08-18 MED ORDER — MIDAZOLAM HCL 2 MG/2ML IJ SOLN: 2.0000 mg | INTRAMUSCULAR | Status: DC | PRN

## 2011-08-18 MED ORDER — BISACODYL 5 MG PO TBEC
10.0000 mg | DELAYED_RELEASE_TABLET | Freq: Every day | ORAL | Status: DC
  Administered 2011-08-19: 10 mg via ORAL
  Filled 2011-08-18: qty 2

## 2011-08-18 MED ORDER — PHENYLEPHRINE HCL 10 MG/ML IJ SOLN
0.0000 ug/min | INTRAVENOUS | Status: DC
  Filled 2011-08-18: qty 2

## 2011-08-18 MED ORDER — DEXTROSE 5 % IV SOLN
0.7500 g | INTRAVENOUS | Status: DC | PRN
  Administered 2011-08-18: .75 g via INTRAVENOUS

## 2011-08-18 MED ORDER — VECURONIUM BROMIDE 10 MG IV SOLR
INTRAVENOUS | Status: DC | PRN
  Administered 2011-08-18 (×2): 5 mg via INTRAVENOUS

## 2011-08-18 MED ORDER — BISACODYL 10 MG RE SUPP: 10.0000 mg | Freq: Every day | RECTAL | Status: DC

## 2011-08-18 MED ORDER — SODIUM CHLORIDE 0.9 % IJ SOLN
3.0000 mL | Freq: Two times a day (BID) | INTRAMUSCULAR | Status: DC
  Administered 2011-08-19: 3 mL via INTRAVENOUS

## 2011-08-18 MED ORDER — POTASSIUM CHLORIDE 10 MEQ/50ML IV SOLN: 10.0000 meq | INTRAVENOUS | Status: AC

## 2011-08-18 MED ORDER — LACTATED RINGERS IV SOLN
INTRAVENOUS | Status: DC
  Administered 2011-08-18: 20 mL/h via INTRAVENOUS

## 2011-08-18 MED ORDER — SODIUM CHLORIDE 0.45 % IV SOLN
INTRAVENOUS | Status: DC
  Administered 2011-08-18: 20 mL/h via INTRAVENOUS

## 2011-08-18 MED ORDER — SODIUM CHLORIDE 0.9 % IV SOLN
200.0000 ug | INTRAVENOUS | Status: DC | PRN
  Administered 2011-08-18: 0.2 ug/kg/h via INTRAVENOUS

## 2011-08-18 MED ORDER — SODIUM CHLORIDE 0.9 % IV SOLN
100.0000 [IU] | INTRAVENOUS | Status: DC | PRN
  Administered 2011-08-18: 1 [IU]/h via INTRAVENOUS

## 2011-08-18 MED ORDER — PANTOPRAZOLE SODIUM 40 MG PO TBEC: 40.0000 mg | DELAYED_RELEASE_TABLET | Freq: Every day | ORAL | Status: DC

## 2011-08-18 SURGICAL SUPPLY — 91 items
ADAPTER CARDIO PERF ANTE/RETRO (ADAPTER) ×2 IMPLANT
ADPR PRFSN 84XANTGRD RTRGD (ADAPTER) ×1
APPLICATOR COTTON TIP 6IN STRL (MISCELLANEOUS) IMPLANT
ATTRACTOMAT 16X20 MAGNETIC DRP (DRAPES) ×2 IMPLANT
BAG DECANTER FOR FLEXI CONT (MISCELLANEOUS) ×2 IMPLANT
BLADE SAW STERNAL (BLADE) ×2 IMPLANT
BLADE SURG 11 STRL SS (BLADE) IMPLANT
BLADE SURG 15 STRL LF DISP TIS (BLADE) ×1 IMPLANT
BLADE SURG 15 STRL SS (BLADE) ×2
BOOT SUTURE AID YELLOW STND (SUTURE) ×2 IMPLANT
CANISTER SUCTION 2500CC (MISCELLANEOUS) ×3 IMPLANT
CANNULA GUNDRY RCSP 15FR (MISCELLANEOUS) IMPLANT
CANNULA VENOUS DUAL 32/40 (CANNULA) IMPLANT
CATH HEART VENT LEFT (CATHETERS) ×1 IMPLANT
CATH RETROPLEGIA CORONARY 14FR (CATHETERS) ×2 IMPLANT
CATH/SQUID NICHOLS JEHLE COR (CATHETERS) IMPLANT
CLIP FOGARTY SPRING 6M (CLIP) IMPLANT
CLOTH BEACON ORANGE TIMEOUT ST (SAFETY) ×2 IMPLANT
CONN ST 1/4X3/8  BEN (MISCELLANEOUS) ×1
CONN ST 1/4X3/8 BEN (MISCELLANEOUS) ×1 IMPLANT
COVER SURGICAL LIGHT HANDLE (MISCELLANEOUS) ×4 IMPLANT
CRADLE DONUT ADULT HEAD (MISCELLANEOUS) ×2 IMPLANT
DRAIN CHANNEL 28F RND 3/8 FF (WOUND CARE) ×1 IMPLANT
DRAIN CHANNEL 32F RND 10.7 FF (WOUND CARE) IMPLANT
DRAPE CARDIOVASCULAR INCISE (DRAPES) ×2
DRAPE SLUSH MACHINE 52X66 (DRAPES) ×2 IMPLANT
DRAPE SLUSH/WARMER DISC (DRAPES) IMPLANT
DRAPE SRG 135X102X78XABS (DRAPES) ×1 IMPLANT
DRSG COVADERM 4X14 (GAUZE/BANDAGES/DRESSINGS) ×2 IMPLANT
ELECT BLADE 4.0 EZ CLEAN MEGAD (MISCELLANEOUS) ×6
ELECT CAUTERY BLADE 6.4 (BLADE) IMPLANT
ELECT REM PT RETURN 9FT ADLT (ELECTROSURGICAL) ×4
ELECTRODE BLDE 4.0 EZ CLN MEGD (MISCELLANEOUS) ×1 IMPLANT
ELECTRODE REM PT RTRN 9FT ADLT (ELECTROSURGICAL) ×2 IMPLANT
GAUZE SPONGE 4X4 12PLY STRL LF (GAUZE/BANDAGES/DRESSINGS) ×2 IMPLANT
GLOVE BIO SURGEON STRL SZ 6.5 (GLOVE) ×7 IMPLANT
GOWN STRL NON-REIN LRG LVL3 (GOWN DISPOSABLE) ×8 IMPLANT
HEMOSTAT POWDER SURGIFOAM 1G (HEMOSTASIS) ×8 IMPLANT
HEMOSTAT SURGICEL 2X14 (HEMOSTASIS) IMPLANT
INSERT FOGARTY XLG (MISCELLANEOUS) IMPLANT
KIT BASIN OR (CUSTOM PROCEDURE TRAY) ×2 IMPLANT
KIT PAIN CUSTOM (MISCELLANEOUS) IMPLANT
KIT ROOM TURNOVER OR (KITS) ×2 IMPLANT
KIT SUCTION CATH 14FR (SUCTIONS) IMPLANT
LEAD PACING MYOCARDI (MISCELLANEOUS) IMPLANT
LINE VENT (MISCELLANEOUS) ×2 IMPLANT
NEEDLE 18GX1X1/2 (RX/OR ONLY) (NEEDLE) ×2 IMPLANT
NS IRRIG 1000ML POUR BTL (IV SOLUTION) ×10 IMPLANT
PACK OPEN HEART (CUSTOM PROCEDURE TRAY) ×2 IMPLANT
PAD ARMBOARD 7.5X6 YLW CONV (MISCELLANEOUS) ×4 IMPLANT
SET CARDIOPLEGIA MPS 5001102 (MISCELLANEOUS) ×2 IMPLANT
SPONGE GAUZE 4X4 12PLY (GAUZE/BANDAGES/DRESSINGS) ×4 IMPLANT
SPONGE LAP 18X18 X RAY DECT (DISPOSABLE) ×4 IMPLANT
STOPCOCK 4 WAY LG BORE MALE ST (IV SETS) IMPLANT
SUT ETHIBON 2 0 V 52N 30 (SUTURE) ×7 IMPLANT
SUT ETHIBON EXCEL 2-0 V-5 (SUTURE) IMPLANT
SUT ETHIBOND 2 0 SH (SUTURE)
SUT ETHIBOND 2 0 SH 36X2 (SUTURE) IMPLANT
SUT ETHIBOND 2 0 V4 (SUTURE) IMPLANT
SUT ETHIBOND 2 0V4 GREEN (SUTURE) IMPLANT
SUT ETHIBOND V-5 VALVE (SUTURE) IMPLANT
SUT PROLENE 3 0 RB 1 (SUTURE) ×2 IMPLANT
SUT PROLENE 3 0 SH 1 (SUTURE) IMPLANT
SUT PROLENE 4 0 RB 1 (SUTURE) ×14
SUT PROLENE 4-0 RB1 .5 CRCL 36 (SUTURE) IMPLANT
SUT PROLENE 5 0 C 1 36 (SUTURE) ×4 IMPLANT
SUT SILK  1 MH (SUTURE) ×2
SUT SILK 1 MH (SUTURE) ×2 IMPLANT
SUT SILK 1 TIES 10X30 (SUTURE) ×2 IMPLANT
SUT SILK 2 0 (SUTURE) ×2
SUT SILK 2 0 SH CR/8 (SUTURE) ×4 IMPLANT
SUT SILK 2-0 18XBRD TIE 12 (SUTURE) ×1 IMPLANT
SUT SILK 3 0 SH CR/8 (SUTURE) ×2 IMPLANT
SUT SILK 4 0 (SUTURE) ×2
SUT SILK 4-0 18XBRD TIE 12 (SUTURE) ×1 IMPLANT
SUT STEEL 6MS V (SUTURE) IMPLANT
SUT TEM PAC WIRE 2 0 SH (SUTURE) ×4 IMPLANT
SUT VIC AB 1 CTX 18 (SUTURE) ×2 IMPLANT
SUT VIC AB 2-0 CTX 27 (SUTURE) IMPLANT
SUT VIC AB 3-0 X1 27 (SUTURE) IMPLANT
SYSTEM SAHARA CHEST DRAIN ATS (WOUND CARE) ×2 IMPLANT
TAPE CLOTH SURG 4X10 WHT LF (GAUZE/BANDAGES/DRESSINGS) ×2 IMPLANT
TAPES RETRACTO (MISCELLANEOUS) ×2 IMPLANT
TOWEL OR 17X24 6PK STRL BLUE (TOWEL DISPOSABLE) ×2 IMPLANT
TOWEL OR 17X26 10 PK STRL BLUE (TOWEL DISPOSABLE) ×2 IMPLANT
TRAY FOLEY IC TEMP SENS 16FR (CATHETERS) ×2 IMPLANT
TUBE FEEDING 8FR 16IN STR KANG (MISCELLANEOUS) ×2 IMPLANT
UNDERPAD 30X30 INCONTINENT (UNDERPADS AND DIAPERS) ×2 IMPLANT
VALVE MAGNA EASE AORTIC 23MM (Prosthesis & Implant Heart) ×1 IMPLANT
VENT LEFT HEART 12002 (CATHETERS) ×2
WATER STERILE IRR 1000ML POUR (IV SOLUTION) ×4 IMPLANT

## 2011-08-18 NOTE — Plan of Care (Signed)
Problem: Phase II Progression Outcomes Goal: Patient extubated within - Outcome: Completed/Met Date Met:  08/18/11 Surg. End time 12:29; extubation 17:23

## 2011-08-18 NOTE — OR Nursing (Signed)
Surgical ICU notified when patient was taken off pump. Patient's family was notified when patient was taken off pump.

## 2011-08-18 NOTE — Progress Notes (Signed)
Patient ID: Joe Shah, male   DOB: 27-Sep-1940, 71 y.o.   MRN: [id number]  Filed Vitals:   08/18/11 1800 08/18/11 1815 08/18/11 1830 08/18/11 1845  BP: 106/69     Pulse: 79 79 80 79  Temp: 98.2 F (36.8 C) 98.2 F (36.8 C) 98.2 F (36.8 C) 98.4 F (36.9 C)  TempSrc:      Resp: 18 17 17 19   Weight:      SpO2: 100% 100% 100% 100%   Extubated Urine output ok CT output low  CBC    Component Value Date/Time   WBC 8.9 08/18/2011 1300   RBC 3.79* 08/18/2011 1300   HGB 10.2* 08/18/2011 2006   HCT 30.0* 08/18/2011 2006   PLT 123* 08/18/2011 1300   MCV 80.5 08/18/2011 1300   MCH 27.2 08/18/2011 1300   MCHC 33.8 08/18/2011 1300   RDW 13.9 08/18/2011 1300   LYMPHSABS 1.5 05/08/2008 0915   MONOABS 0.4 05/08/2008 0915   EOSABS 0.2 05/08/2008 0915   BASOSABS 0.0 05/08/2008 0915    BMET    Component Value Date/Time   NA 140 08/18/2011 2006   K 4.3 08/18/2011 2006   CL 106 08/18/2011 2006   CO2 23 08/14/2011 1000   GLUCOSE 111* 08/18/2011 2006   BUN 22 08/18/2011 2006   CREATININE 0.90 08/18/2011 2006   CALCIUM 9.5 08/14/2011 1000   GFRNONAA 81* 08/14/2011 1000   GFRAA >90 08/14/2011 1000    A/P:  Doing well postop

## 2011-08-18 NOTE — Anesthesia Preprocedure Evaluation (Signed)
Anesthesia Evaluation  Patient identified by MRN, date of birth, ID band Patient awake    Reviewed: Allergy & Precautions, H&P , NPO status , Patient's Chart, lab work & pertinent test results  Airway Mallampati: II  Neck ROM: full    Dental   Pulmonary shortness of breath, former smoker         Cardiovascular hypertension, + Valvular Problems/Murmurs AS     Neuro/Psych    GI/Hepatic   Endo/Other    Renal/GU      Musculoskeletal   Abdominal   Peds  Hematology   Anesthesia Other Findings   Reproductive/Obstetrics                           Anesthesia Physical Anesthesia Plan  ASA: III  Anesthesia Plan: General   Post-op Pain Management:    Induction: Intravenous  Airway Management Planned: Oral ETT  Additional Equipment: Arterial line, CVP, PA Cath and TEE  Intra-op Plan:   Post-operative Plan: Post-operative intubation/ventilation  Informed Consent: I have reviewed the patients History and Physical, chart, labs and discussed the procedure including the risks, benefits and alternatives for the proposed anesthesia with the patient or authorized representative who has indicated his/her understanding and acceptance.     Plan Discussed with: CRNA and Surgeon  Anesthesia Plan Comments:         Anesthesia Quick Evaluation
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
## 2011-08-18 NOTE — Transfer of Care (Signed)
Immediate Anesthesia Transfer of Care Note  Patient: Joe Shah  Procedure(s) Performed:  AORTIC VALVE REPLACEMENT (AVR) - On pump.  Patient Location: SICU  Anesthesia Type: General  Level of Consciousness: Patient remains intubated per anesthesia plan  Airway & Oxygen Therapy: Patient remains intubated per anesthesia plan and Patient placed on Ventilator (see vital sign flow sheet for setting)  Post-op Assessment: Report given to PACU RN and Post -op Vital signs reviewed and stable  Post vital signs: Reviewed and stable Filed Vitals:   08/18/11 1252  BP: 104/55  Pulse: 90  Temp:   Resp: 12    Complications: No apparent anesthesia complications

## 2011-08-18 NOTE — Anesthesia Postprocedure Evaluation (Signed)
  Anesthesia Post-op Note  Patient: Joe Shah  Procedure(s) Performed:  AORTIC VALVE REPLACEMENT (AVR) - On pump.  Patient Location: ICU  Anesthesia Type: General  Level of Consciousness: sedated  Airway and Oxygen Therapy: Patient remains intubated per anesthesia plan  Post-op Pain: none  Post-op Assessment: Post-op Vital signs reviewed, Patient's Cardiovascular Status Stable and Respiratory Function Stable  Post-op Vital Signs: Reviewed and stable  Complications: No apparent anesthesia complications

## 2011-08-18 NOTE — Preoperative (Signed)
Beta Blockers   Reason not to administer Beta Blockers:Not Applicable 

## 2011-08-18 NOTE — Progress Notes (Signed)
  Echocardiogram Echocardiogram Transesophageal has been performed.  Joe Shah 08/18/2011, 9:19 AM

## 2011-08-18 NOTE — Progress Notes (Signed)
Respiratory Therapy Extubation Procedure Note   Procedure Time out: Performed  Verified Patient ID:  yes Verified Procedure:  yes  Reason For Extubation: no further need for ventilatory support  Special Equipment Available:  no   Pre Extubation Assessment:   Placed in sitting position: yes Lungs inflated prior to tube removal:  yes Gag Reflex:moderate NIF:  -34 cm H2O VC:  1200 mL  Cuff Deflation Test - Able to breathe around deflated cuff: yes Pharynx Suctioned prior to cuff deflation: yes          Post Extubation Oxygen Delivery Device: 4 liters/min via Patient connected to nasal cannula oxygen  clear to auscultation, no wheezes, rales, or rhonchi   Inocente Salles RRT,RCP 08/18/2011 5:42 PM

## 2011-08-18 NOTE — Brief Op Note (Signed)
08/18/2011  12:16 PM  PATIENT:  Joe Shah  71 y.o. male  PRE-OPERATIVE DIAGNOSIS:  aortic stenosis  POST-OPERATIVE DIAGNOSIS:  aortic stenosis  PROCEDURE:  Procedure(s): AORTIC VALVE REPLACEMENT (AVR) # [address] Ease Tissue Valve model 3300TFX [id number] SURGEON:   Delight Ovens, MD    ASSISTANTS: Cecile Sheerer SA   ANESTHESIA:   general  EBL:  Total I/O In: 2850 [I.V.:2000; Blood:850] Out: 3200 [Urine:1200; Blood:2000]  BLOOD ADMINISTERED:850 ml Cell Saver  DRAINS: Urinary Catheter (Foley) and (2) Blake drain(s) in the mediastinum    DISPOSITION OF SPECIMEN:  Path Aortic Vaslve  COUNTS:  YES   DICTATION: .Other Dictation:   PLAN OF CARE: Admit to inpatient   PATIENT DISPOSITION:  ICU - extubated and stable.   Delay start of Pharmacological VTE agent (>24hrs) due to surgical blood loss or risk of bleeding: yes

## 2011-08-19 ENCOUNTER — Encounter (HOSPITAL_COMMUNITY): Payer: Self-pay | Admitting: *Deleted

## 2011-08-19 ENCOUNTER — Inpatient Hospital Stay (HOSPITAL_COMMUNITY): Payer: Medicare Other

## 2011-08-19 LAB — POCT I-STAT, CHEM 8
BUN: 15 mg/dL (ref 6–23)
Calcium, Ion: 1.2 mmol/L (ref 1.12–1.32)
Chloride: 100 mEq/L (ref 96–112)
Creatinine, Ser: 0.9 mg/dL (ref 0.50–1.35)
Glucose, Bld: 179 mg/dL — ABNORMAL HIGH (ref 70–99)
HCT: 28 % — ABNORMAL LOW (ref 39.0–52.0)
Hemoglobin: 9.5 g/dL — ABNORMAL LOW (ref 13.0–17.0)
Potassium: 4.1 mEq/L (ref 3.5–5.1)
Sodium: 138 mEq/L (ref 135–145)
TCO2: 25 mmol/L (ref 0–100)

## 2011-08-19 LAB — GLUCOSE, CAPILLARY
Glucose-Capillary: 110 mg/dL — ABNORMAL HIGH (ref 70–99)
Glucose-Capillary: 115 mg/dL — ABNORMAL HIGH (ref 70–99)
Glucose-Capillary: 116 mg/dL — ABNORMAL HIGH (ref 70–99)
Glucose-Capillary: 119 mg/dL — ABNORMAL HIGH (ref 70–99)
Glucose-Capillary: 86 mg/dL (ref 70–99)
Glucose-Capillary: 122 mg/dL — ABNORMAL HIGH (ref 70–99)
Glucose-Capillary: 121 mg/dL — ABNORMAL HIGH (ref 70–99)
Glucose-Capillary: 160 mg/dL — ABNORMAL HIGH (ref 70–99)
Glucose-Capillary: 135 mg/dL — ABNORMAL HIGH (ref 70–99)
Glucose-Capillary: 195 mg/dL — ABNORMAL HIGH (ref 70–99)
Glucose-Capillary: 154 mg/dL — ABNORMAL HIGH (ref 70–99)

## 2011-08-19 LAB — CBC
HCT: 29.6 % — ABNORMAL LOW (ref 39.0–52.0)
HCT: 29.5 % — ABNORMAL LOW (ref 39.0–52.0)
Hemoglobin: 9.8 g/dL — ABNORMAL LOW (ref 13.0–17.0)
Hemoglobin: 9.7 g/dL — ABNORMAL LOW (ref 13.0–17.0)
MCH: 26.8 pg (ref 26.0–34.0)
MCH: 26.8 pg (ref 26.0–34.0)
MCHC: 33.1 g/dL (ref 30.0–36.0)
MCHC: 32.9 g/dL (ref 30.0–36.0)
MCV: 80.9 fL (ref 78.0–100.0)
MCV: 81.5 fL (ref 78.0–100.0)
Platelets: 135 10*3/uL — ABNORMAL LOW (ref 150–400)
Platelets: 124 10*3/uL — ABNORMAL LOW (ref 150–400)
RBC: 3.66 MIL/uL — ABNORMAL LOW (ref 4.22–5.81)
RBC: 3.62 MIL/uL — ABNORMAL LOW (ref 4.22–5.81)
RDW: 14 % (ref 11.5–15.5)
RDW: 13.9 % (ref 11.5–15.5)
WBC: 10 10*3/uL (ref 4.0–10.5)
WBC: 9.7 10*3/uL (ref 4.0–10.5)

## 2011-08-19 LAB — BASIC METABOLIC PANEL
BUN: 19 mg/dL (ref 6–23)
CO2: 21 mEq/L (ref 19–32)
Calcium: 8.5 mg/dL (ref 8.4–10.5)
Chloride: 104 mEq/L (ref 96–112)
Creatinine, Ser: 0.82 mg/dL (ref 0.50–1.35)
GFR calc Af Amer: >90 mL/min (ref >90)
GFR calc non Af Amer: 88 mL/min — ABNORMAL LOW (ref >90)
Glucose, Bld: 131 mg/dL — ABNORMAL HIGH (ref 70–99)
Potassium: 4.1 mEq/L (ref 3.5–5.1)
Sodium: 136 mEq/L (ref 135–145)

## 2011-08-19 LAB — CREATININE, SERUM
Creatinine, Ser: 0.77 mg/dL (ref 0.50–1.35)
GFR calc Af Amer: >90 mL/min (ref >90)
GFR calc non Af Amer: 90 mL/min — ABNORMAL LOW (ref >90)

## 2011-08-19 LAB — MAGNESIUM
Magnesium: 2.2 mg/dL (ref 1.5–2.5)
Magnesium: 1.9 mg/dL (ref 1.5–2.5)

## 2011-08-19 MED ORDER — INSULIN ASPART 100 UNIT/ML ~~LOC~~ SOLN
0.0000 [IU] | SUBCUTANEOUS | Status: DC
Start: 2011-08-19 — End: 2011-08-20
  Administered 2011-08-19 (×3): 2 [IU] via SUBCUTANEOUS
  Administered 2011-08-19 – 2011-08-20 (×2): 4 [IU] via SUBCUTANEOUS
  Administered 2011-08-20: 2 [IU] via SUBCUTANEOUS

## 2011-08-19 MED ORDER — LISINOPRIL 2.5 MG PO TABS
2.5000 mg | ORAL_TABLET | Freq: Two times a day (BID) | ORAL | Status: DC
  Administered 2011-08-19 (×2): 2.5 mg via ORAL
  Filled 2011-08-19 (×4): qty 1

## 2011-08-19 MED ORDER — INSULIN ASPART 100 UNIT/ML ~~LOC~~ SOLN
0.0000 [IU] | SUBCUTANEOUS | Status: DC
  Administered 2011-08-19 (×2): 2 [IU] via SUBCUTANEOUS
  Filled 2011-08-19: qty 3

## 2011-08-19 MED ORDER — INSULIN ASPART 100 UNIT/ML ~~LOC~~ SOLN: 0.0000 [IU] | SUBCUTANEOUS | Status: DC

## 2011-08-19 MED FILL — Magnesium Sulfate Inj 50%: INTRAMUSCULAR | Qty: 10 | Status: AC

## 2011-08-19 MED FILL — Dexmedetomidine HCl IV Soln 200 MCG/2ML: INTRAVENOUS | Qty: 2 | Status: AC

## 2011-08-19 MED FILL — Potassium Chloride Inj 2 mEq/ML: INTRAVENOUS | Qty: 40 | Status: AC

## 2011-08-19 NOTE — Progress Notes (Signed)
1 Day Post-Op Procedure(s) (LRB): AORTIC VALVE REPLACEMENT (AVR) (N/A) Subjective: Awake and neuro intact good pain control  Objective: Vital signs in last 24 hours: Temp:  [96.1 F (35.6 C)-100.4 F (38 C)] 100 F (37.8 C) (01/16 0600) Pulse Rate:  [73-96] 94  (01/16 0600) Cardiac Rhythm:  [-] Normal sinus rhythm (01/16 0600) Resp:  [12-27] 27  (01/16 0600) BP: (93-147)/(55-106) 136/86 mmHg (01/16 0600) SpO2:  [92 %-100 %] 96 % (01/16 0600) Arterial Line BP: (90-187)/(55-83) 187/83 mmHg (01/16 0600) FiO2 (%):  [39.5 %-50.2 %] 39.5 % (01/15 1715) Weight:  [180 lb 12.4 oz (82 kg)] 180 lb 12.4 oz (82 kg) (01/15 2348)  Hemodynamic parameters for last 24 hours: PAP: (16-27)/(7-17) 27/17 mmHg CO:  [3.9 L/min-5.7 L/min] 5.7 L/min CI:  [2 L/min/m2-2.6 L/min/m2] 2.6 L/min/m2  Intake/Output from previous day: 01/15 0701 - 01/16 0700 In: 6634.9 [P.O.:30; I.V.:4924.9; Blood:850; NG/GT:30; IV Piggyback:800] Out: 5595 [Urine:3245; Emesis/NG output:100; Blood:2000; Chest Tube:250] Intake/Output this shift:    General appearance: alert, cooperative and no distress Neurologic: intact Heart: regular rate and rhythm, S1, S2 normal, no murmur, click, rub or gallop Lungs: clear to auscultation bilaterally Abdomen: soft, non-tender; bowel sounds normal; no masses,  no organomegaly Extremities: extremities normal, atraumatic, no cyanosis or edema and Homans sign is negative, no sign of DVT Wound: sternum stable  Lab Results:  Basename 08/19/11 0318 08/18/11 2008  WBC 10.0 7.1  HGB 9.8* 10.5*  HCT 29.6* 32.1*  PLT 135* 130*   BMET:  Basename 08/19/11 0318 08/18/11 2008 08/18/11 2006  NA 136 -- 140  K 4.1 -- 4.3  CL 104 -- 106  CO2 21 -- --  GLUCOSE 131* -- 111*  BUN 19 -- 22  CREATININE 0.82 0.83 --  CALCIUM 8.5 -- --    PT/INR:  Basename 08/18/11 1300  LABPROT 20.5*  INR 1.72*   ABG    Component Value Date/Time   PHART 7.371 08/18/2011 2002   HCO3 23.2 08/18/2011 2002   TCO2 22 08/18/2011 2006   ACIDBASEDEF 2.0 08/18/2011 2002   O2SAT 98.0 08/18/2011 2002   CBG (last 3)   Basename 08/19/11 0326 08/19/11 0111 08/19/11 0007  GLUCAP 122* 110* 86    Assessment/Plan: S/P Procedure(s) (LRB): AORTIC VALVE REPLACEMENT (AVR) (N/A) Mobilize Diuresis d/c tubes/lines See progression orders Expected acute blood loss anemia, but no blood transfusion given, will try t oavoid   LOS: 1 day    Pinchos Topel B 08/19/2011

## 2011-08-19 NOTE — Op Note (Signed)
NAME:  Joe Shah, Joe Shah NO.:  [id number]  MEDICAL RECORD NO.:  [id number]  LOCATION:  2313                         FACILITY:  [facility]  PHYSICIAN:  Sheliah Plane, MD    DATE OF BIRTH:  [birth year]  DATE OF PROCEDURE:  08/18/2011 DATE OF DISCHARGE:                              OPERATIVE REPORT   PREOPERATIVE DIAGNOSIS:  Critical aortic stenosis, and moderate aortic insufficiency.  POSTOPERATIVE DIAGNOSIS:  Critical aortic stenosis, and moderate aortic insufficiency.  SURGICAL PROCEDURE:  Aortic valve replacement with a pericardial tissue valve, [employer], model 3300TFX 23-mm, serial [id number].  SURGEON:  Sheliah Plane, MD  FIRST ASSISTANT:  Cecile Sheerer, SA  BRIEF HISTORY:  The patient is a 71 year old male who has had increasing symptoms of shortness of breath with exertion and found to have critical aortic stenosis with a velocity across his aortic valve of 497 cm/second.  He also had moderate aortic insufficiency.  Dr. [name] performed cardiac catheterization.  At the time of catheterization, he was estimated to have a valve area of 0.98 and without significant coronary occlusive disease.  Because of the patient's symptoms and critical aortic stenosis, aortic valve replacement was recommended.  A tissue valve was selected.  The patient agreed with the risks and options and signed informed consent.  DESCRIPTION OF PROCEDURE:  The patient underwent general endotracheal anesthesia without incident.  Skin of the chest and legs was prepped with Betadine and draped in usual sterile manner.  A TEE probe was placed and the findings are under a separate note but confirmed the previous diagnosis of critical aortic stenosis and aortic insufficiency. Median sternotomy was performed.  Pericardium was opened.  The patient did have evidence of left ventricular hypertrophy but overall preserved LV function.  He was systemically heparinized.  Ascending aorta  was cannulated.  The right atrium was cannulated.  A retrograde cardioplegia catheter was placed.  He was placed on cardiopulmonary bypass after systemic heparinization at 2.4. L per minute.  A right superior pulmonary vein vent was placed.  The patient's body temperature was cooled to 32 degrees.  Aortic crossclamp was applied and 800 mL of cold blood potassium cardioplegia was administered initially antegrade until cardiac arrest occurred and then the remainder retrograde.  Myocardial septal temperatures were monitored throughout the crossclamp period.  A transverse aortotomy was made.  This gave good exposure to a trileaflet calcified aortic valve.  The patient's aorta was slightly small with the anulus of reasonable size.  The valve was excised and the anulus debrided of calcium.  A 23 pericardial Magna Ease tissue valve was selected and seated well.  #2 Tycron pledgeted sutures were placed through the aortic anulus with the pledgets on the ventricular surface, a total of 14 sutures.  This was used to secure the valve in place which seated well.  There was no impingement on the right or left coronary ostium.  Care was taken to remove all loose calcific debris.  The aortotomy was then closed with horizontal mattress sutures with running 4-0 Prolene suture over felt strips second layer over and over 4-0 Prolene was carried out.  Prior to complete closure of the aortotomy, the heart was  allowed to passively fill and de-air.  CO2 had been placed in the pericardial sac.  Further de-airing through the aortic root vent was also carried out.  The aortic cross-clamp was removed with total cross-clamp time of 82 minutes.  The patient will acquire elective defibrillation turn to a sinus rhythm.  Atrial and ventricular pacing wires were applied.  TEE showed good function of the aortic valve without evidence of perivalvular leak.  The right superior pulmonary vein vent was removed.  Retrograde  cardioplegia catheter was removed. With the patient's body temperature rewarmed, atrial and ventricular pacing wires were applied.  He was ventilated and weaned from cardiopulmonary bypass without difficulty.  He remained hemodynamically stable.  He was decannulated in usual fashion.  Protamine sulfate was administered.  Two Blake drains were left in the mediastinum, one anterior and one posterior.  The pericardium was loosely reapproximated. Sternum closed with #6 stainless steel wire.  Fascia closed with interrupted 0 Vicryl, running 3-0 Vicryl in subcutaneous tissue, 4-0 subcuticular stitch in skin edges.  Dry dressings were applied.  Sponge and needle count was reported as correct at the completion of the procedure.  The patient did not require any blood bank blood products during the procedure and was transferred to surgical intensive care unit for further postoperative care in good condition.  Total pump time was 121 minutes.     Sheliah Plane, MD     EG/MEDQ  D:  08/19/2011  T:  08/19/2011  Job:  [id number]  cc:   Lorine Bears, MD

## 2011-08-19 NOTE — Progress Notes (Signed)
Patient ID: Joe Shah, male   DOB: 30-Mar-1941, 71 y.o.   MRN: [id number]                   [address]            [place] [address]          [phone]     BP 156/83  Pulse 90  Temp(Src) 98.5 F (36.9 C) (Oral)  Resp 23  Ht 5\' 9"  (1.753 m)  Wt 180 lb 12.4 oz (82 kg)  BMI 26.70 kg/m2  SpO2 94%  Doing well post op BP still up on NTG drip Will add lisinopril back and wean off NTG  Delight Ovens MD  Beeper [phone] Office [phone] 5:35 PM

## 2011-08-20 ENCOUNTER — Inpatient Hospital Stay (HOSPITAL_COMMUNITY): Payer: Medicare Other

## 2011-08-20 ENCOUNTER — Other Ambulatory Visit: Payer: Self-pay

## 2011-08-20 ENCOUNTER — Encounter (HOSPITAL_COMMUNITY): Payer: Self-pay | Admitting: Cardiothoracic Surgery

## 2011-08-20 DIAGNOSIS — I359 Nonrheumatic aortic valve disorder, unspecified: Principal | ICD-10-CM

## 2011-08-20 LAB — GLUCOSE, CAPILLARY
Glucose-Capillary: 138 mg/dL — ABNORMAL HIGH (ref 70–99)
Glucose-Capillary: 151 mg/dL — ABNORMAL HIGH (ref 70–99)
Glucose-Capillary: 166 mg/dL — ABNORMAL HIGH (ref 70–99)

## 2011-08-20 LAB — CBC
HCT: 29 % — ABNORMAL LOW (ref 39.0–52.0)
Hemoglobin: 9.6 g/dL — ABNORMAL LOW (ref 13.0–17.0)
MCH: 26.9 pg (ref 26.0–34.0)
MCHC: 33.1 g/dL (ref 30.0–36.0)
MCV: 81.2 fL (ref 78.0–100.0)
Platelets: 130 10*3/uL — ABNORMAL LOW (ref 150–400)
RBC: 3.57 MIL/uL — ABNORMAL LOW (ref 4.22–5.81)
RDW: 13.9 % (ref 11.5–15.5)
WBC: 9.2 10*3/uL (ref 4.0–10.5)

## 2011-08-20 LAB — BASIC METABOLIC PANEL
BUN: 17 mg/dL (ref 6–23)
CO2: 26 mEq/L (ref 19–32)
Calcium: 8.6 mg/dL (ref 8.4–10.5)
Chloride: 101 mEq/L (ref 96–112)
Creatinine, Ser: 0.74 mg/dL (ref 0.50–1.35)
GFR calc Af Amer: >90 mL/min (ref >90)
GFR calc non Af Amer: >90 mL/min (ref >90)
Glucose, Bld: 161 mg/dL — ABNORMAL HIGH (ref 70–99)
Potassium: 3.4 mEq/L — ABNORMAL LOW (ref 3.5–5.1)
Sodium: 136 mEq/L (ref 135–145)

## 2011-08-20 MED ORDER — GUAIFENESIN ER 600 MG PO TB12
600.0000 mg | ORAL_TABLET | Freq: Two times a day (BID) | ORAL | Status: DC | PRN
  Filled 2011-08-20: qty 1

## 2011-08-20 MED ORDER — BISACODYL 10 MG RE SUPP: 10.0000 mg | Freq: Every day | RECTAL | Status: DC | PRN

## 2011-08-20 MED ORDER — MOVING RIGHT ALONG BOOK
Freq: Once | Status: AC
  Administered 2011-08-20: 08:00:00
  Filled 2011-08-20: qty 1

## 2011-08-20 MED ORDER — BISACODYL 5 MG PO TBEC: 10.0000 mg | DELAYED_RELEASE_TABLET | Freq: Every day | ORAL | Status: DC | PRN

## 2011-08-20 MED ORDER — POTASSIUM CHLORIDE 10 MEQ/50ML IV SOLN
10.0000 meq | INTRAVENOUS | Status: DC | PRN
  Administered 2011-08-20 (×3): 10 meq via INTRAVENOUS
  Filled 2011-08-20 (×2): qty 50

## 2011-08-20 MED ORDER — AMIODARONE LOAD VIA INFUSION
150.0000 mg | Freq: Once | INTRAVENOUS | Status: DC
  Administered 2011-08-20: 150 mg via INTRAVENOUS
  Filled 2011-08-20: qty 83.34

## 2011-08-20 MED ORDER — ASPIRIN EC 325 MG PO TBEC
325.0000 mg | DELAYED_RELEASE_TABLET | Freq: Every day | ORAL | Status: DC
  Filled 2011-08-20: qty 1

## 2011-08-20 MED ORDER — MOVING RIGHT ALONG BOOK
Freq: Once | Status: AC
  Administered 2011-08-20: 07:00:00
  Filled 2011-08-20: qty 1

## 2011-08-20 MED ORDER — POTASSIUM CHLORIDE 10 MEQ/50ML IV SOLN
INTRAVENOUS | Status: AC
  Administered 2011-08-20: 07:00:00
  Filled 2011-08-20: qty 50

## 2011-08-20 MED ORDER — ASPIRIN EC 325 MG PO TBEC
325.0000 mg | DELAYED_RELEASE_TABLET | Freq: Every day | ORAL | Status: DC
  Administered 2011-08-20 – 2011-08-22 (×3): 325 mg via ORAL
  Filled 2011-08-20 (×3): qty 1

## 2011-08-20 MED ORDER — AMIODARONE HCL 200 MG PO TABS
400.0000 mg | ORAL_TABLET | Freq: Two times a day (BID) | ORAL | Status: DC
  Administered 2011-08-21 – 2011-08-22 (×3): 400 mg via ORAL
  Filled 2011-08-20 (×4): qty 2

## 2011-08-20 MED ORDER — ONDANSETRON HCL 4 MG PO TABS: 4.0000 mg | ORAL_TABLET | Freq: Four times a day (QID) | ORAL | Status: DC | PRN

## 2011-08-20 MED ORDER — AMIODARONE HCL IN DEXTROSE 360-4.14 MG/200ML-% IV SOLN
1.0000 mg/min | INTRAVENOUS | Status: AC
  Administered 2011-08-20 (×2): 1 mg/min via INTRAVENOUS
  Filled 2011-08-20 (×2): qty 200

## 2011-08-20 MED ORDER — ACETAMINOPHEN 325 MG PO TABS: 650.0000 mg | ORAL_TABLET | Freq: Four times a day (QID) | ORAL | Status: DC | PRN

## 2011-08-20 MED ORDER — SIMVASTATIN 20 MG PO TABS
20.0000 mg | ORAL_TABLET | Freq: Every day | ORAL | Status: DC
  Administered 2011-08-20 – 2011-08-21 (×2): 20 mg via ORAL
  Filled 2011-08-20 (×3): qty 1

## 2011-08-20 MED ORDER — LISINOPRIL 2.5 MG PO TABS: 2.5000 mg | ORAL_TABLET | Freq: Two times a day (BID) | ORAL | Status: DC

## 2011-08-20 MED ORDER — AMIODARONE HCL 200 MG PO TABS: 400.0000 mg | ORAL_TABLET | Freq: Every day | ORAL | Status: DC

## 2011-08-20 MED ORDER — ONDANSETRON HCL 4 MG/2ML IJ SOLN: 4.0000 mg | Freq: Four times a day (QID) | INTRAMUSCULAR | Status: DC | PRN

## 2011-08-20 MED ORDER — PANTOPRAZOLE SODIUM 40 MG PO TBEC
40.0000 mg | DELAYED_RELEASE_TABLET | Freq: Every day | ORAL | Status: DC
  Administered 2011-08-20 – 2011-08-21 (×2): 40 mg via ORAL
  Filled 2011-08-20 (×2): qty 1

## 2011-08-20 MED ORDER — SODIUM CHLORIDE 0.9 % IJ SOLN
3.0000 mL | Freq: Two times a day (BID) | INTRAMUSCULAR | Status: DC
  Administered 2011-08-20 (×2): 3 mL via INTRAVENOUS

## 2011-08-20 MED ORDER — AMIODARONE HCL IN DEXTROSE 360-4.14 MG/200ML-% IV SOLN
0.5000 mg/min | INTRAVENOUS | Status: DC
  Filled 2011-08-20 (×3): qty 200

## 2011-08-20 MED ORDER — METOPROLOL TARTRATE 25 MG PO TABS
25.0000 mg | ORAL_TABLET | Freq: Two times a day (BID) | ORAL | Status: DC
  Administered 2011-08-20 – 2011-08-22 (×5): 25 mg via ORAL
  Filled 2011-08-20 (×6): qty 1

## 2011-08-20 MED ORDER — LISINOPRIL 5 MG PO TABS
5.0000 mg | ORAL_TABLET | Freq: Two times a day (BID) | ORAL | Status: DC
  Administered 2011-08-20 – 2011-08-22 (×5): 5 mg via ORAL
  Filled 2011-08-20 (×8): qty 1

## 2011-08-20 MED ORDER — SODIUM CHLORIDE 0.9 % IJ SOLN: 3.0000 mL | INTRAMUSCULAR | Status: DC | PRN

## 2011-08-20 MED ORDER — DOCUSATE SODIUM 100 MG PO CAPS
200.0000 mg | ORAL_CAPSULE | Freq: Every day | ORAL | Status: DC
  Administered 2011-08-20 – 2011-08-22 (×2): 200 mg via ORAL
  Filled 2011-08-20 (×3): qty 2

## 2011-08-20 MED ORDER — SODIUM CHLORIDE 0.9 % IJ SOLN
3.0000 mL | Freq: Two times a day (BID) | INTRAMUSCULAR | Status: DC
  Administered 2011-08-20: 3 mL via INTRAVENOUS

## 2011-08-20 MED ORDER — DOCUSATE SODIUM 100 MG PO CAPS: 200.0000 mg | ORAL_CAPSULE | Freq: Every day | ORAL | Status: DC

## 2011-08-20 MED ORDER — SODIUM CHLORIDE 0.9 % IV SOLN: 250.0000 mL | INTRAVENOUS | Status: DC | PRN

## 2011-08-20 MED ORDER — TRAMADOL HCL 50 MG PO TABS
50.0000 mg | ORAL_TABLET | ORAL | Status: DC | PRN
  Administered 2011-08-20: 100 mg via ORAL
  Filled 2011-08-20: qty 2

## 2011-08-20 NOTE — Progress Notes (Signed)
Subjective:   71 yo gentleman with aortic stenosis/ aortic insuff. - s/p AVR ( Edwards pericardial tissue valve)  on 08/18/11.  He has done well .  Ambulating .  BP and HR are OK.minimal chest discomfort.      Marland Kitchen aspirin EC  325 mg Oral Daily  . aspirin EC  325 mg Oral Daily  . docusate sodium  200 mg Oral Daily  . docusate sodium  200 mg Oral Daily  . famotidine (PEPCID) IV  20 mg Intravenous Q12H  . lisinopril  2.5 mg Oral BID  . metoprolol tartrate  25 mg Oral BID  . moving right along book   Does not apply Once  . moving right along book   Does not apply Once  . pantoprazole  40 mg Oral QAC breakfast  . potassium chloride      . simvastatin  20 mg Oral q1800  . sodium chloride  3 mL Intravenous Q12H  . sodium chloride  3 mL Intravenous Q12H  . DISCONTD: acetaminophen (TYLENOL) oral liquid 160 mg/5 mL  975 mg Per Tube Q6H  . DISCONTD: acetaminophen  1,000 mg Oral Q6H  . DISCONTD: aspirin  324 mg Per Tube Daily  . DISCONTD: aspirin EC  325 mg Oral Daily  . DISCONTD: bisacodyl  10 mg Oral Daily  . DISCONTD: bisacodyl  10 mg Rectal Daily  . DISCONTD: cefUROXime (ZINACEF)  IV  1.5 g Intravenous Q12H  . DISCONTD: docusate sodium  200 mg Oral Daily  . DISCONTD: insulin aspart  0-24 Units Subcutaneous Q4H  . DISCONTD: insulin regular  0-10 Units Intravenous TID WC  . DISCONTD: lisinopril  2.5 mg Oral BID  . DISCONTD: metoprolol tartrate  12.5 mg Per Tube BID  . DISCONTD: metoprolol tartrate  12.5 mg Oral BID  . DISCONTD: pantoprazole  40 mg Oral Q1200  . DISCONTD: simvastatin  20 mg Oral QHS  . DISCONTD: sodium chloride  3 mL Intravenous Q12H      . DISCONTD: sodium chloride 20 mL (08/18/11 1300)  . DISCONTD: sodium chloride 250 mL (08/19/11 0616)  . DISCONTD: dexmedetomidine (PRECEDEX) IV infusion 0.1 mcg/kg/hr (08/18/11 1709)  . DISCONTD: insulin (NOVOLIN-R) infusion 2.3 Units/hr (08/18/11 1230)  . DISCONTD: lactated ringers 20 mL/hr at 08/19/11 1900  . DISCONTD:  nitroGLYCERIN Stopped (08/20/11 0200)  . DISCONTD: phenylephrine (NEO-SYNEPHRINE) Adult infusion 5.067 mcg/min (08/18/11 1416)    Objective:  Vital Signs in the last 24 hours: Blood pressure 148/88, pulse 96, temperature 97.8 F (36.6 C), temperature source Oral, resp. rate 19, height 5\' 9"  (1.753 m), weight 186 lb 1.1 oz (84.4 kg), SpO2 96.00%. Temp:  [97.8 F (36.6 C)-99.5 F (37.5 C)] 97.8 F (36.6 C) (01/17 0742) Pulse Rate:  [84-103] 96  (01/17 0700) Resp:  [19-31] 19  (01/17 0700) BP: (108-165)/(67-88) 148/88 mmHg (01/17 0700) SpO2:  [90 %-100 %] 96 % (01/17 0700) Arterial Line BP: (119-162)/(64-102) 160/69 mmHg (01/16 1400) Weight:  [186 lb 1.1 oz (84.4 kg)] 186 lb 1.1 oz (84.4 kg) (01/17 0500)  Intake/Output from previous day: 01/16 0701 - 01/17 0700 In: 1218.1 [P.O.:320; I.V.:798.1; IV Piggyback:100] Out: 3070 [Urine:3070] Intake/Output from this shift: Total I/O In: 70 [I.V.:20; IV Piggyback:50] Out: -   Physical Exam: The patient is alert and oriented x 3.  The mood and affect are normal.   Skin: warm and dry.  Color is normal.    HEENT:   the sclera are nonicteric.  The mucous membranes are moist.  The carotids are 2+ without bruits.  There is no thyromegaly.  There is no JVD.    Lungs: clear.  The chest wall is mildly tender  Heart: regular rate with a normal S1 and S2.  There are no murmurs, gallops, or rubs. The PMI is not displaced.     Abdomen: good bowel sounds.  There is no guarding or rebound.  There is no hepatosplenomegaly or tenderness.  There are no masses.   Extremities:  no clubbing, cyanosis, or edema.  The legs are without rashes.  The distal pulses are intact.   Neuro:  Cranial nerves II - XII are intact.  Motor and sensory functions are intact.     Lab Results:  Basename 08/20/11 0400 08/19/11 1720 08/19/11 1700  WBC 9.2 -- 9.7  HGB 9.6* 9.5* --  PLT 130* -- 124*    Basename 08/20/11 0400 08/19/11 1720 08/19/11 0318  NA 136 138 --    K 3.4* 4.1 --  CL 101 100 --  CO2 26 -- 21  GLUCOSE 161* 179* --  BUN 17 15 --  CREATININE 0.74 0.90 --   No results found for this basename: TROPONINI:2,CK,MB:2 in the last 72 hours No results found for this basename: BNP in the last 72 hours Hepatic Function Panel No results found for this basename: PROT,ALBUMIN,AST,ALT,ALKPHOS,BILITOT,BILIDIR,IBILI in the last 72 hours No results found for this basename: CHOL, HDL, LDLCALC, LDLDIRECT, TRIG, CHOLHDL    Basename 08/18/11 1300  INR 1.72*   Tele:  NSR  Assessment/Plan:   1.  Aortic stenosis / aortic insufficiency:  S/p AVR.  Progressing well  2.  HTN:  Will increase Lisinopril to 5 mg a day.   Disposition: to floor soon  Alvia Grove., MD, [facility] 08/20/2011, 7:50 AM LOS: Day 2

## 2011-08-20 NOTE — Progress Notes (Signed)
Patient ID: Joe Shah, male   DOB: [birth year], 71 y.o.   MRN: [id number] T[facility] DAILY PROGRESS NOTE                   [address]            [name] [address]          [phone]      2 Days Post-Op Procedure(s) (LRB): AORTIC VALVE REPLACEMENT (AVR) (N/A)  Total Length of Stay:  LOS: 2 days   Subjective: Feels well, ambulated around unit 4 times already this am  Objective: Vital signs in last 24 hours: Patient Vitals for the past 24 hrs:  BP Temp Temp src Pulse Resp SpO2 Weight  08/20/11 0630 147/84 mmHg - - 100  20  96 % -  08/20/11 0600 140/86 mmHg - - 97  22  94 % -  08/20/11 0530 136/76 mmHg - - 103  31  95 % -  08/20/11 0500 149/87 mmHg - - 92  25  96 % 186 lb 1.1 oz (84.4 kg)  08/20/11 0445 143/85 mmHg - - 94  25  98 % -  08/20/11 0430 151/82 mmHg - - 91  23  96 % -  08/20/11 0415 143/82 mmHg - - 89  22  96 % -  08/20/11 0408 - 98.7 F (37.1 C) Oral - - - -  08/20/11 0400 145/79 mmHg - - 91  27  95 % -  08/20/11 0345 136/84 mmHg - - 91  23  96 % -  08/20/11 0330 143/83 mmHg - - 87  23  96 % -  08/20/11 0315 138/78 mmHg - - 84  23  96 % -  08/20/11 0300 141/76 mmHg - - 90  21  96 % -  08/20/11 0245 146/82 mmHg - - 86  24  95 % -  08/20/11 0230 157/86 mmHg - - 96  24  95 % -  08/20/11 0215 148/88 mmHg - - 96  22  95 % -  08/20/11 0200 150/82 mmHg - - 94  19  95 % -  08/20/11 0145 136/75 mmHg - - 95  25  95 % -  08/20/11 0130 136/77 mmHg - - 93  27  96 % -  08/20/11 0115 108/67 mmHg - - 94  27  97 % -  08/20/11 0100 145/80 mmHg - - 91  27  96 % -  08/20/11 0045 140/75 mmHg - - 90  23  97 % -  08/20/11 0030 136/82 mmHg - - 89  24  96 % -  08/20/11 0015 146/79 mmHg - - 89  26  95 % -  08/20/11 0000 130/86 mmHg - - 88  22  95 % -  08/19/11 2353 - 99.4 F (37.4 C) Oral - - - -  08/19/11 2300 140/74 mmHg - - 90  25  95 % -  08/19/11 2200 135/77 mmHg - - 90  27  94 % -  08/19/11 2100 147/85 mmHg - - 90  26  95 % -  08/19/11 2000 146/82 mmHg - -  89  24  94 % -  08/19/11 1952 - 99.3 F (37.4 C) Oral - - - -  08/19/11 1930 142/77 mmHg - - 91  25  90 % -  08/19/11 1900 143/80 mmHg - - 88  24  92 % -  08/19/11 1830 140/77 mmHg - - 87  26  94 % -  08/19/11 1800 124/80 mmHg - - 84  22  96 % -  08/19/11 1730 150/87 mmHg - - 95  26  93 % -  08/19/11 1700 151/83 mmHg - - 97  25  91 % -  08/19/11 1630 159/80 mmHg - - 92  21  92 % -  08/19/11 1600 156/83 mmHg - - 90  23  94 % -  08/19/11 1544 - 98.5 F (36.9 C) Oral - - - -  08/19/11 1530 147/77 mmHg - - 90  20  92 % -  08/19/11 1500 165/83 mmHg - - 93  27  92 % -  08/19/11 1400 150/82 mmHg - - 93  24  94 % -  08/19/11 1330 142/79 mmHg - - 95  25  95 % -  08/19/11 1300 142/81 mmHg - - 88  23  99 % -  08/19/11 1200 145/76 mmHg - - 90  24  94 % -  08/19/11 1140 - 98.6 F (37 C) Oral - - - -  08/19/11 1100 112/75 mmHg - - 92  23  95 % -  08/19/11 1000 147/81 mmHg - - 98  24  97 % -  08/19/11 0900 148/84 mmHg - - 95  22  100 % -  08/19/11 0801 - 99.4 F (37.4 C) Oral - - - -  08/19/11 0800 149/81 mmHg 99.5 F (37.5 C) Core 95  25  98 % -   Wt Readings from Last 3 Encounters:  08/20/11 186 lb 1.1 oz (84.4 kg)  08/20/11 186 lb 1.1 oz (84.4 kg)  08/17/11 182 lb (82.555 kg)   Weight change: 5 lb 4.7 oz (2.4 kg)   Hemodynamic parameters for last 24 hours: PAP: (22)/(12) 22/12 mmHg  Intake/Output from previous day: 01/16 0701 - 01/17 0700 In: 1218.1 [P.O.:320; I.V.:798.1; IV Piggyback:100] Out: 3070 [Urine:3070]  Intake/Output this shift: Total I/O In: 490.6 [I.V.:440.6; IV Piggyback:50] Out: 1385 [Urine:1385]  Current Meds: Scheduled Meds:   . acetaminophen  1,000 mg Oral Q6H   Or  . acetaminophen (TYLENOL) oral liquid 160 mg/5 mL  975 mg Per Tube Q6H  . aspirin EC  325 mg Oral Daily   Or  . aspirin  324 mg Per Tube Daily  . bisacodyl  10 mg Oral Daily   Or  . bisacodyl  10 mg Rectal Daily  . cefUROXime (ZINACEF)  IV  1.5 g Intravenous Q12H  . docusate sodium  200  mg Oral Daily  . famotidine (PEPCID) IV  20 mg Intravenous Q12H  . insulin aspart  0-24 Units Subcutaneous Q4H  . insulin regular  0-10 Units Intravenous TID WC  . lisinopril  2.5 mg Oral BID  . metoprolol tartrate  12.5 mg Oral BID   Or  . metoprolol tartrate  12.5 mg Per Tube BID  . pantoprazole  40 mg Oral Q1200  . simvastatin  20 mg Oral QHS  . sodium chloride  3 mL Intravenous Q12H  . DISCONTD: insulin aspart  0-24 Units Subcutaneous Q2H  . DISCONTD: insulin aspart  0-24 Units Subcutaneous Q4H   Continuous Infusions:   . sodium chloride 20 mL (08/18/11 1300)  . sodium chloride 250 mL (08/19/11 0616)  . dexmedetomidine (PRECEDEX) IV infusion 0.1 mcg/kg/hr (08/18/11 1709)  . insulin (NOVOLIN-R) infusion 2.3 Units/hr (08/18/11 1230)  . lactated ringers 20 mL/hr at 08/19/11 1900  . nitroGLYCERIN Stopped (08/20/11 0200)  . phenylephrine (NEO-SYNEPHRINE) Adult infusion  5.067 mcg/min (08/18/11 1416)  . DISCONTD: sodium chloride 20 mL/hr at 08/19/11 0600   PRN Meds:.albumin human, metoprolol, midazolam, morphine, ondansetron (ZOFRAN) IV, oxyCODONE, sodium chloride  General appearance: alert, cooperative and no distress Heart: regular rate and rhythm, S1, S2 normal, no murmur, click, rub or gallop Lungs: clear to auscultation bilaterally Wound: sternum stable  Lab Results: CBC: Basename 08/20/11 0400 08/19/11 1720 08/19/11 1700  WBC 9.2 -- 9.7  HGB 9.6* 9.5* --  HCT 29.0* 28.0* --  PLT 130* -- 124*   BMET:  Basename 08/20/11 0400 08/19/11 1720 08/19/11 0318  NA 136 138 --  K 3.4* 4.1 --  CL 101 100 --  CO2 26 -- 21  GLUCOSE 161* 179* --  BUN 17 15 --  CREATININE 0.74 0.90 --  CALCIUM 8.6 -- 8.5    PT/INR:  Basename 08/18/11 1300  LABPROT 20.5*  INR 1.72*   Radiology: Dg Chest Portable 1 View In Am  08/19/2011  *RADIOLOGY REPORT*  Clinical Data: Postop  PORTABLE CHEST - 1 VIEW  Comparison: Yesterday  Findings: Mild cardiomegaly.  Patchy opacities at the left base  have increased after endotracheal and NG tube removal.  Stable Swan- Ganz catheter.  No pneumothorax.  IMPRESSION: Extubated with increasing left basilar atelectasis verses airspace disease.  Original Report Authenticated By: Donavan Burnet, M.D.   Dg Chest Portable 1 View  08/18/2011  *RADIOLOGY REPORT*  Clinical Data: Postop CABG.  PORTABLE CHEST - 1 VIEW  Comparison: 08/14/2011  Findings: Endotracheal tube is 3 cm above the carina.  Right Swan- Ganz catheter tip is in the central right pulmonary artery.  No pneumothorax.  Low lung volumes.  Vascular crowding and bibasilar atelectasis.  NG tube is in the stomach.  IMPRESSION: Postoperative changes.  Low volumes, vascular congestion and bibasilar atelectasis.  Original Report Authenticated By: Cyndie Chime, M.D.     Assessment/Plan: S/P Procedure(s) (LRB): AORTIC VALVE REPLACEMENT (AVR) (N/A) Mobilize Plan for transfer to step-down: see transfer orders ACE restarted for BP control    Delight Ovens MD  Beeper [phone] Office [phone] 08/20/2011 6:53 AM

## 2011-08-20 NOTE — Progress Notes (Signed)
Patient seen ambulating in the hall independently at the beginning of the shift.  Heart rate was up to 150s.  Shortly thereafter, patient returned to his room and HR sustained in 130s at rest.  EKG showed A Fib with RVR.  Patient was asymptomatic with stable vitals other than HR.  Notified Dr. Tyrone Sage and orders received to begin Amiodarone drip per protocol.  Will continue to monitor.  Arva Chafe

## 2011-08-21 ENCOUNTER — Other Ambulatory Visit: Payer: Self-pay

## 2011-08-21 ENCOUNTER — Inpatient Hospital Stay (HOSPITAL_COMMUNITY): Payer: Medicare Other

## 2011-08-21 DIAGNOSIS — Z954 Presence of other heart-valve replacement: Secondary | ICD-10-CM

## 2011-08-21 DIAGNOSIS — I10 Essential (primary) hypertension: Secondary | ICD-10-CM

## 2011-08-21 LAB — CBC
HCT: 29.7 % — ABNORMAL LOW (ref 39.0–52.0)
Hemoglobin: 9.7 g/dL — ABNORMAL LOW (ref 13.0–17.0)
MCH: 26.7 pg (ref 26.0–34.0)
MCHC: 32.7 g/dL (ref 30.0–36.0)
MCV: 81.8 fL (ref 78.0–100.0)
Platelets: 151 10*3/uL (ref 150–400)
RBC: 3.63 MIL/uL — ABNORMAL LOW (ref 4.22–5.81)
RDW: 13.9 % (ref 11.5–15.5)
WBC: 8.8 10*3/uL (ref 4.0–10.5)

## 2011-08-21 LAB — BASIC METABOLIC PANEL
BUN: 17 mg/dL (ref 6–23)
CO2: 26 mEq/L (ref 19–32)
Calcium: 8.6 mg/dL (ref 8.4–10.5)
Chloride: 101 mEq/L (ref 96–112)
Creatinine, Ser: 0.81 mg/dL (ref 0.50–1.35)
GFR calc Af Amer: >90 mL/min (ref >90)
GFR calc non Af Amer: 88 mL/min — ABNORMAL LOW (ref >90)
Glucose, Bld: 135 mg/dL — ABNORMAL HIGH (ref 70–99)
Potassium: 3.4 mEq/L — ABNORMAL LOW (ref 3.5–5.1)
Sodium: 136 mEq/L (ref 135–145)

## 2011-08-21 MED ORDER — ZOLPIDEM TARTRATE 5 MG PO TABS: 5.0000 mg | ORAL_TABLET | Freq: Every evening | ORAL | Status: DC | PRN

## 2011-08-21 MED ORDER — AMIODARONE HCL 400 MG PO TABS: ORAL_TABLET | ORAL | Status: DC

## 2011-08-21 MED ORDER — ASPIRIN 325 MG PO TBEC: 325.0000 mg | DELAYED_RELEASE_TABLET | Freq: Every day | ORAL | Status: AC

## 2011-08-21 MED ORDER — POTASSIUM CHLORIDE CRYS ER 20 MEQ PO TBCR
40.0000 meq | EXTENDED_RELEASE_TABLET | Freq: Once | ORAL | Status: AC
  Administered 2011-08-21: 40 meq via ORAL
  Filled 2011-08-21: qty 2

## 2011-08-21 MED ORDER — METOPROLOL TARTRATE 25 MG PO TABS: 25.0000 mg | ORAL_TABLET | Freq: Two times a day (BID) | ORAL | Status: DC

## 2011-08-21 MED ORDER — LISINOPRIL 5 MG PO TABS: 5.0000 mg | ORAL_TABLET | Freq: Two times a day (BID) | ORAL | Status: DC

## 2011-08-21 MED ORDER — TRAMADOL HCL 50 MG PO TABS: 50.0000 mg | ORAL_TABLET | ORAL | Status: AC | PRN

## 2011-08-21 MED FILL — Mannitol IV Soln 20%: INTRAVENOUS | Qty: 500 | Status: AC

## 2011-08-21 MED FILL — Sodium Bicarbonate IV Soln 8.4%: INTRAVENOUS | Qty: 50 | Status: AC

## 2011-08-21 MED FILL — Sodium Chloride Irrigation Soln 0.9%: Qty: 3000 | Status: AC

## 2011-08-21 MED FILL — Heparin Sodium (Porcine) Inj 1000 Unit/ML: INTRAMUSCULAR | Qty: 40 | Status: AC

## 2011-08-21 MED FILL — Lidocaine HCl IV Inj 20 MG/ML: INTRAVENOUS | Qty: 5 | Status: AC

## 2011-08-21 MED FILL — Sodium Chloride IV Soln 0.9%: INTRAVENOUS | Qty: 1000 | Status: AC

## 2011-08-21 MED FILL — Electrolyte-R (PH 7.4) Solution: INTRAVENOUS | Qty: 5000 | Status: AC

## 2011-08-21 NOTE — Discharge Summary (Signed)
[address]            [place] [address]          [phone]         Discharge Summary  Name: Joe Shah DOB: 01/12/1941 71 y.o. MRN: [id number]  Admission Date: 08/18/2011 Discharge Date:    Admitting Diagnosis:  Critical aortic stenosis  Moderate aortic insufficiency  Discharge Diagnosis:   Critical aortic stenosis  Moderate aortic insufficiency  Hypertension  Hyperlipidemia  History of microscopic hematuria  Arthritis  Scoliosis  History of ADD  Postoperative atrial fibrillation  Remote history of tobacco use     Procedures: Procedure(s): AORTIC VALVE REPLACEMENT (#23 Magna Ease pericardial tissue valve) on 08/18/2011   HPI:  The patient is a 71 y.o. 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 He recently presented with increasing shortness of breath with exertion and chest pain. He saw Dr. [name], and underwent an echocardiogram which showed severe aortic stenosis and moderate aortic insufficiency with a mean gradient of 53 mm mercury and a calculated valve area of 0.92. Dr. [name]  performed cardiac catheterization. At the time of catheterization, he  was estimated to have a valve area of 0.98 and without significant  coronary occlusive disease. Because of the patient's symptoms and  critical aortic stenosis, aortic valve replacement was recommended. He was seen as an outpatient consultation by Dr. Tyrone Sage, he agreed with the need for aortic valve replacement at this time.All risks, benefits and alternatives of surgery were explained in detail, and the patient agreed to proceed.     Hospital Course:  The patient was admitted to [facility] on 08/18/2011. The patient was taken to the operating room and underwent the above procedure.    The  postoperative course has been notable for hypertension. He initially required a nitroglycerin drip and was started on a beta blocker and ACE inhibitor. He ultimately was weaned from all drips and his antihypertensive doses have been titrated accordingly. He has also had transient atrial fibrillation which converted to normal sinus rhythm after a bolus of IV amiodarone. He was started on by mouth amiodarone and has continued to maintain sinus rhythm since that time.  Overall he is progressing well postoperatively. He is ambulating in the halls without difficulty. He is tolerating a regular diet. He has had a mild postop blood loss anemia which has not required a transfusion. He has remained afebrile and his vital signs are stable. His incisions are all healing well. He has no significant volume overload. It is felt that he remains stable over the next 24 hours he will be ready for discharge home.   Recent vital signs:  Filed Vitals:   08/21/11 1011  BP: 107/66  Pulse:   Temp:   Resp:     Recent laboratory studies:  CBC: Basename 08/21/11 0500 08/20/11 0400  WBC 8.8 9.2  HGB 9.7* 9.6*  HCT 29.7* 29.0*  PLT 151 130*   BMET:  Basename 08/21/11 0500 08/20/11 0400  NA 136 136  K 3.4* 3.4*  CL 101 101  CO2 26 26  GLUCOSE 135* 161*  BUN 17 17  CREATININE 0.81 0.74  CALCIUM 8.6 8.6    PT/INR: No results found  for this basename: LABPROT,INR in the last 72 hours  Discharge Medications:  Current Discharge Medication List    START taking these medications   Details  amiodarone (PACERONE) 400 MG tablet Take 400 mg po bid x 1 week, then 200 mg po bid thereafter Qty: 90 tablet, Refills: 1    lisinopril (PRINIVIL,ZESTRIL) 5 MG tablet Take 1 tablet (5 mg total) by mouth 2 (two) times daily. Qty: 60 tablet, Refills: 1    metoprolol tartrate (LOPRESSOR) 25 MG tablet Take 1 tablet (25 mg total) by mouth 2 (two) times daily. Qty: 60 tablet, Refills: 1    traMADol (ULTRAM) 50 MG tablet Take  1-2 tablets (50-100 mg total) by mouth every 4 (four) hours as needed for pain. Qty: 30 tablet, Refills: 0      CONTINUE these medications which have CHANGED   Details  aspirin EC 325 MG EC tablet Take 1 tablet (325 mg total) by mouth daily. Qty: 30 tablet      CONTINUE these medications which have NOT CHANGED   Details  glucosamine-chondroitin 500-400 MG tablet Take 2 tablets by mouth daily.      simvastatin (ZOCOR) 20 MG tablet Take 20 mg by mouth at bedtime.        STOP taking these medications     meloxicam (MOBIC) 15 MG tablet      quinapril (ACCUPRIL) 40 MG tablet         Discharge Instructions:  The patient is to refrain from driving, heavy lifting or strenuous activity.  May shower daily and clean incisions with soap and water.  May resume regular diet.  Discharge Followup: He will make an appointment to see Dr. [name] in 2 weeks. He will see Dr. Tyrone Sage in 3 weeks with a chest x-ray (office will arrange).    [name] 08/21/2011, 1:31 PM

## 2011-08-21 NOTE — Progress Notes (Addendum)
                    [address]            [place] [address]          [phone]     3 Days Post-Op Procedure(s) (LRB): AORTIC VALVE REPLACEMENT (AVR) (N/A)  Subjective: Feels well, no complaints. Episode AF last night, now in SR on po Amio.  Objective: Vital signs in last 24 hours: Patient Vitals for the past 24 hrs:  BP Temp Temp src Pulse Resp SpO2 Weight  08/21/11 1011 107/66 mmHg - - - - - -  08/21/11 9147 114/71 mmHg - - - - - -  08/21/11 0514 130/75 mmHg 99.4 F (37.4 C) Oral 78  19  97 % 83.6 kg (184 lb 4.9 oz)  08/20/11 2139 138/85 mmHg 99.5 F (37.5 C) Oral 129  20  93 % -  08/20/11 2054 135/95 mmHg - - 114  - - -  08/20/11 1948 131/82 mmHg - - 136  22  96 % -  08/20/11 1434 139/76 mmHg 98 F (36.7 C) Oral 102  18  95 % -  08/20/11 1400 137/72 mmHg - - 100  25  93 % -  08/20/11 1300 132/77 mmHg - - 94  19  98 % -  08/20/11 1240 - 97.9 F (36.6 C) Oral - - - -  08/20/11 1200 - - - 89  23  96 % -   Current Weight  08/21/11 83.6 kg (184 lb 4.9 oz)     Intake/Output from previous day: 01/17 0701 - 01/18 0700 In: 837.1 [P.O.:240; I.V.:547.1; IV Piggyback:50] Out: 640 [Urine:640]    PHYSICAL EXAM:  Heart: RRR Lungs: clear Wound: clean and dry Extremities: no edema  Lab Results: CBC: Basename 08/21/11 0500 08/20/11 0400  WBC 8.8 9.2  HGB 9.7* 9.6*  HCT 29.7* 29.0*  PLT 151 130*   BMET:  Basename 08/21/11 0500 08/20/11 0400  NA 136 136  K 3.4* 3.4*  CL 101 101  CO2 26 26  GLUCOSE 135* 161*  BUN 17 17  CREATININE 0.81 0.74  CALCIUM 8.6 8.6    PT/INR:  Basename 08/18/11 1300  LABPROT 20.5*  INR 1.72*     Assessment/Plan: S/P Procedure(s) (LRB): AORTIC VALVE REPLACEMENT (AVR) (N/A) Transient AF- now SR on Amio, Lopressor.  BPs generally stable.  Continue ACE-I. Hypokalemia- replace K+ CRPI, pulm toilet, wean O2 Hopefully home over weekend if remains stable.    LOS: 3 days    Shah,Joe H 08/21/2011   no  further AFIB Poss D/C tomorrow I have seen and examined Joe Shah and agree with the above assessment  and plan.  Delight Ovens MD Beeper [phone] Office [phone] 08/21/2011 1:43 PM

## 2011-08-21 NOTE — Progress Notes (Signed)
Subjective:   71 yo gentleman with aortic stenosis/ aortic insuff. - s/p AVR ( Edwards pericardial tissue valve)  on 08/18/11.  He has done well .  Ambulating .  BP and HR are OK.minimal chest discomfort.  1/18- The patient developed A-Fib with RVR last night while amabulating.  Was started on an Amiodarone drip - he has now converted back to sinus rhythm.        Marland Kitchen amiodarone  150 mg Intravenous Once  . amiodarone  400 mg Oral Q12H   Followed by  . amiodarone  400 mg Oral Daily  . aspirin EC  325 mg Oral Daily  . docusate sodium  200 mg Oral Daily  . lisinopril  5 mg Oral BID  . metoprolol tartrate  25 mg Oral BID  . pantoprazole  40 mg Oral QAC breakfast  . simvastatin  20 mg Oral q1800  . sodium chloride  3 mL Intravenous Q12H  . sodium chloride  3 mL Intravenous Q12H  . DISCONTD: aspirin EC  325 mg Oral Daily  . DISCONTD: docusate sodium  200 mg Oral Daily      . amiodarone (NEXTERONE PREMIX) 360 mg/200 mL dextrose 1 mg/min (08/20/11 2352)   And  . amiodarone (NEXTERONE PREMIX) 360 mg/200 mL dextrose 0.5 mg/min (08/21/11 0200)    Objective:  Vital Signs in the last 24 hours: Blood pressure 114/71, pulse 78, temperature 99.4 F (37.4 C), temperature source Oral, resp. rate 19, height 5\' 9"  (1.753 m), weight 184 lb 4.9 oz (83.6 kg), SpO2 97.00%. Temp:  [97.9 F (36.6 C)-99.5 F (37.5 C)] 99.4 F (37.4 C) (01/18 0514) Pulse Rate:  [78-136] 78  (01/18 0514) Resp:  [18-26] 19  (01/18 0514) BP: (114-158)/(71-95) 114/71 mmHg (01/18 0811) SpO2:  [93 %-98 %] 97 % (01/18 0514) Weight:  [184 lb 4.9 oz (83.6 kg)] 184 lb 4.9 oz (83.6 kg) (01/18 0514)  Intake/Output from previous day: 01/17 0701 - 01/18 0700 In: 837.1 [P.O.:240; I.V.:547.1; IV Piggyback:50] Out: 640 [Urine:640] Intake/Output from this shift:    Physical Exam: The patient is alert and oriented x 3.  The mood and affect are normal.   Skin: warm and dry.  Color is normal.    HEENT:   the sclera are  nonicteric.  The mucous membranes are moist.  The carotids are 2+ without bruits.  There is no thyromegaly.  There is no JVD.    Lungs: clear.  The chest wall is mildly tender  Heart: regular rate with a normal S1 and S2.  There are no murmurs, gallops, or rubs. The PMI is not displaced.     Abdomen: good bowel sounds.  There is no guarding or rebound.  There is no hepatosplenomegaly or tenderness.  There are no masses.   Extremities:  no clubbing, cyanosis, or edema.  The legs are without rashes.  The distal pulses are intact.   Neuro:  Cranial nerves II - XII are intact.  Motor and sensory functions are intact.     Lab Results:  Basename 08/21/11 0500 08/20/11 0400  WBC 8.8 9.2  HGB 9.7* 9.6*  PLT 151 130*    Basename 08/21/11 0500 08/20/11 0400  NA 136 136  K 3.4* 3.4*  CL 101 101  CO2 26 26  GLUCOSE 135* 161*  BUN 17 17  CREATININE 0.81 0.74   No results found for this basename: TROPONINI:2,CK,MB:2 in the last 72 hours No results found for this basename: BNP in the  last 72 hours Hepatic Function Panel No results found for this basename: PROT,ALBUMIN,AST,ALT,ALKPHOS,BILITOT,BILIDIR,IBILI in the last 72 hours No results found for this basename: CHOL,  HDL,  LDLCALC,  LDLDIRECT,  TRIG,  CHOLHDL    Basename 08/18/11 1300  INR 1.72*   Tele:  NSR  Assessment/Plan:   1.  Aortic stenosis / aortic insufficiency:  S/p AVR.  Progressing well  2.  HTN:  Will increase Lisinopril to 5 mg a day.  3.  Atrial Fibrillation:  Back to sinus rhythm with Amio drip.  On Amiodarone tabs.  Would continue Amio for several days   Disposition: ambulate.  Vesta Mixer, Montez Hageman., MD, [facility] 08/21/2011, 9:25 AM LOS: Day 3

## 2011-08-21 NOTE — Progress Notes (Signed)
CARDIAC REHAB PHASE I   PRE:  Rate/Rhythm: 83SR  BP:  Supine:   Sitting: 107/66  Standing:    SaO2: 90RA  MODE:  Ambulation: 550 ft   POST:  Rate/Rhythem: 105 ST  BP:  Supine:   Sitting: 141/74  Standing:    SaO2: 91 RA 1005-1100 Tolerated ambulation well,DOE but no c/o. Gait steady VS stable. Completed discharge education with pt. He agrees to [employer]. CRP in [place], will send referral.  Joe Shah

## 2011-08-21 NOTE — Progress Notes (Signed)
1430 - Pt ambulated 500 ft independently, steady gait, tolerated well.  Will continue to monitor.  Ninetta Lights 08/21/2011

## 2011-08-21 NOTE — Progress Notes (Signed)
Pt quit smoking in the 90's but is still dipping snuff. Discussed risk factors of snuff and how it affects his cardiovascular and overall health. Pt motivated and wants to quit on his own. Referred to 1-800 quit now for f/u and support. Discussed oral fixation substitutes, second hand smoke and in home smoking policy. Reviewed and gave pt Written education/contact information.

## 2011-08-21 NOTE — Progress Notes (Signed)
UR Completed.  [phone] Vangie Bicker 08/21/2011

## 2011-08-21 NOTE — Progress Notes (Signed)
Patient seen ambulating in the hall independently, tolerated well and steady on feet.  Now resting comfortably in bed with call bell within reach, will continue to monitor.  Arva Chafe

## 2011-08-22 NOTE — Progress Notes (Signed)
Pt. Discharged 08/22/2011  4:50 PM Discharge instructions reviewed with patient/family. Patient/family verbalized understanding. All Rx's given. Questions answered as needed. Pt. Discharged to home with family/self. Taken off unit via W/C. Joe Shah

## 2011-08-22 NOTE — Progress Notes (Signed)
EPW discontinued per protocol. Tips intact. Patient tolerated well. Last INR INR/Prothrombin Time on   .  Patient advised Bedrest X 1 hour. Tinna Kolker Forrest   

## 2011-08-22 NOTE — Progress Notes (Signed)
[address]            [place] [address]          [phone]     4 Days Post-Op  Procedure(s) (LRB): AORTIC VALVE REPLACEMENT (AVR) (N/A) Subjective: Feels well  Objective  Telemetry NSR, PVC's  Temp:  [97.4 F (36.3 C)-99 F (37.2 C)] 99 F (37.2 C) (01/19 0554) Pulse Rate:  [79-86] 82  (01/19 0554) Resp:  [18] 18  (01/19 0554) BP: (107-124)/(66-68) 124/67 mmHg (01/19 0554) SpO2:  [92 %-95 %] 92 % (01/19 0554) Weight:  [187 lb 13.3 oz (85.2 kg)] 187 lb 13.3 oz (85.2 kg) (01/19 0100)   Intake/Output Summary (Last 24 hours) at 08/22/11 0815 Last data filed at 08/21/11 1700  Gross per 24 hour  Intake    600 ml  Output      0 ml  Net    600 ml       General appearance: alert, cooperative and no distress Heart: regular rate and rhythm, S1, S2 normal and no murmur Lungs: min diminished in bases Abdomen: soft, nontender, NABS Extremities: minor LE edema Wound: incision healing well without signs of infection  Lab Results:  Basename 08/21/11 0500 08/20/11 0400 08/19/11 1700  NA 136 136 --  K 3.4* 3.4* --  CL 101 101 --  CO2 26 26 --  GLUCOSE 135* 161* --  BUN 17 17 --  CREATININE 0.81 0.74 --  CALCIUM 8.6 8.6 --  MG -- -- 1.9  PHOS -- -- --   No results found for this basename: AST:2,ALT:2,ALKPHOS:2,BILITOT:2,PROT:2,ALBUMIN:2 in the last 72 hours No results found for this basename: LIPASE:2,AMYLASE:2 in the last 72 hours  Basename 08/21/11 0500 08/20/11 0400  WBC 8.8 9.2  NEUTROABS -- --  HGB 9.7* 9.6*  HCT 29.7* 29.0*  MCV 81.8 81.2  PLT 151 130*   No results found for this basename: CKTOTAL:4,CKMB:4,TROPONINI:4 in the last 72 hours No components found with this basename: POCBNP:3 No results found for this basename: DDIMER in the last 72 hours No results found for this basename: HGBA1C in the last 72 hours No results found for this basename: CHOL,HDL,LDLCALC,TRIG,CHOLHDL in the last 72 hours No results found for  this basename: TSH,T4TOTAL,FREET3,T3FREE,THYROIDAB in the last 72 hours No results found for this basename: VITAMINB12,FOLATE,FERRITIN,TIBC,IRON,RETICCTPCT in the last 72 hours  Medications: Scheduled    . amiodarone  400 mg Oral Q12H   Followed by  . amiodarone  400 mg Oral Daily  . aspirin EC  325 mg Oral Daily  . docusate sodium  200 mg Oral Daily  . lisinopril  5 mg Oral BID  . metoprolol tartrate  25 mg Oral BID  . pantoprazole  40 mg Oral QAC breakfast  . potassium chloride  40 mEq Oral Once  . simvastatin  20 mg Oral q1800  . sodium chloride  3 mL Intravenous Q12H  . sodium chloride  3 mL Intravenous Q12H     Radiology/Studies:  Dg Chest 2 View  08/21/2011  *RADIOLOGY REPORT*  Clinical Data: Aortic valve replacement  CHEST - 2 VIEW  Comparison: Chest radiograph 08/20/2011  Findings: Sternotomy wires overlie stable enlarged cardiac silhouette.  There is improved aeration to the left lung base.  No pulmonary edema.  No pneumothorax.  Small bilateral pleural effusions persist.  IMPRESSION: Improved aeration to the left lung base.  No pneumothorax.  Original  Report Authenticated By: Genevive Bi, M.D.    INR: Will add last result for INR, ABG once components are confirmed Will add last 4 CBG results once components are confirmed  Assessment/Plan: S/P Procedure(s) (LRB): AORTIC VALVE REPLACEMENT (AVR) (N/A) 1. D/c epw's  2. Stable for discharge today   LOS: 4 days    Joe Shah E 1/19/20138:15 AM

## 2011-08-22 NOTE — Progress Notes (Signed)
Pt. Discharged 08/22/2011  4:57 PM Discharge instructions reviewed with patient/family. Patient/family verbalized understanding. All Rx's given. Questions answered as needed. Pt. Discharged to home with family/self. Taken off unit via W/C. Noah Charon

## 2011-09-08 ENCOUNTER — Other Ambulatory Visit: Payer: Self-pay | Admitting: Cardiothoracic Surgery

## 2011-09-08 DIAGNOSIS — I359 Nonrheumatic aortic valve disorder, unspecified: Secondary | ICD-10-CM

## 2011-09-10 ENCOUNTER — Ambulatory Visit (INDEPENDENT_AMBULATORY_CARE_PROVIDER_SITE_OTHER): Payer: Medicare Other | Admitting: Cardiothoracic Surgery

## 2011-09-10 ENCOUNTER — Ambulatory Visit
Admission: RE | Admit: 2011-09-10 | Discharge: 2011-09-10 | Disposition: A | Discharge status: Home / Self Care | Payer: Medicare Other | Source: Ambulatory Visit | Attending: Cardiothoracic Surgery | Admitting: Cardiothoracic Surgery

## 2011-09-10 ENCOUNTER — Encounter: Payer: Self-pay | Admitting: Cardiothoracic Surgery

## 2011-09-10 VITALS — BP 125/71 | HR 76 | Resp 18 | Ht 69.0 in | Wt 180.0 lb

## 2011-09-10 DIAGNOSIS — I359 Nonrheumatic aortic valve disorder, unspecified: Secondary | ICD-10-CM

## 2011-09-10 DIAGNOSIS — Z952 Presence of prosthetic heart valve: Secondary | ICD-10-CM

## 2011-09-10 DIAGNOSIS — Z954 Presence of other heart-valve replacement: Secondary | ICD-10-CM

## 2011-09-10 NOTE — Patient Instructions (Addendum)
May Drive No heavy 25 lbs  lifting for 2 months     Endocarditis Information  You may be at risk for developing endocarditis since you have  an artificial heart valve  or a repaired heart valve. Endocarditis is an infection of the lining of the heart or heart valves.   Certain surgical and dental procedures may put you at risk,  such as teeth cleaning or other dental procedures or any surgery involving the respiratory, urinary, gastrointestinal tract, gallbladder or prostate.   Notify your doctor or dentist before having any invasive procedures. You will need to take antibiotics before certain procedures.   To prevent endocarditis, maintain good oral health. Seek prompt medical attention for any mouth/gum, skin or urinary tract infections.      Aortic Valve Replacement Care After Read the instructions outlined below and refer to this sheet for the next few weeks. These discharge instructions provide you with general information on caring for yourself after you leave the hospital. Your surgeon may also give you specific instructions. While your treatment has been planned according to the most current medical practices available, unavoidable complications occasionally occur. If you have any problems or questions after discharge, please call your surgeon. AFTER THE PROCEDURE  Full recovery from heart valve surgery can take several months.   Blood thinning (anticoagulation) treatment with warfarin is often prescribed for 6 weeks to 3 months after surgery for those with biological valves. It is prescribed for life for those with mechanical valves.   Recovery includes healing of the surgical incision. There is a gradual building of stamina and exercise abilities. An exercise program under the direction of a physical therapist may be recommended.   Once you have an artificial valve, your heart function and your life will return to normal. You usually feel better after surgery. Shortness of  breath and fatigue should lessen. If your heart was already severely damaged before your surgery, you may continue to have problems.   You can usually resume most of your normal activities. You will have to continue to monitor your condition. You need to watch out for blood clots and infections.   Artificial valves need to be replaced after a period of time. It is important that you see your caregiver regularly.   Some individuals with an aortic valve replacement need to take antibiotics before having dental work or other surgical procedures. This is called prophylactic antibiotic treatment. These drugs help to prevent infective endocarditis. Antibiotics are only recommended for individuals with the highest risk for developing infective endocarditis. Let your dentist and your caregiver know if you have a history of any of the following so that the necessary precautions can be taken:   A VSD.   A repaired VSD.   Endocarditis in the past.   An artificial (prosthetic) heart valve.  HOME CARE INSTRUCTIONS   Use all medications as prescribed.   Take your temperature every morning for the first week after surgery. Record these.   Weigh yourself every morning for at least the first week after surgery and record.   Do not lift more than 10 pounds (4.5 kg) until your breastbone (sternum) has healed. Avoid all activities which would place strain on your incision.   You may shower as soon as directed by your caregiver after surgery. Pat incisions dry. Do not rub incisions with washcloth or towel.   Avoid driving for 4 to 6 weeks following surgery or as instructed.   Use your elastic stockings during the  day. You should wear the stockings for at least 2 weeks after discharge or longer if your ankles are swollen. The stockings help blood flow and help reduce swelling in the legs. It is easiest to put the stockings on before you get out of bed in the morning. They should fit snugly.  Pain  Control  If a prescription was given for a pain reliever, please follow your doctor's directions.   If the pain is not relieved by your medicine, becomes worse, or you have difficulty breathing, call your surgeon.  Activity  Take frequent rest periods throughout the day.   Wait one week before returning to strenuous activities such as heavy lifting (more than 10 pounds), pushing or pulling.   Talk with your doctor about when you may return to work and your exercise routine.   Do not drive while taking prescription pain medication.  Nutrition  You may resume your normal diet.   Drink plenty of fluids (6-8 glasses a day).   Eat a well-balanced diet.   Call your caregiver for persistent nausea or vomiting.  Elimination Your normal bowel function should return. If constipation should occur, you may:  Take a mild laxative.   Add fruit and bran to your diet.   Drink more fluids.   Call your doctor if constipation is not relieved.  SEEK IMMEDIATE MEDICAL CARE IF:   You develop chest pain which is not coming from your surgical cut (incision).   You develop shortness of breath or have difficulty breathing.   You develop a temperature over 101 F (38.3 C).   You have a sudden weight gain. Let your caregiver know what the weight gain is.   You develop a rash.   You develop any reaction or side effects to medications given.   You have increased bleeding from wounds.   You see redness, swelling, or have increasing pain in wounds.   You have pus coming from your wound.   You develop lightheadedness or feel faint.  Document Released: 02/05/2005 Document Revised: 04/01/2011 Document Reviewed: 04/29/2005 [facility] Patient Information 2012 [place], [place].

## 2011-09-10 NOTE — Progress Notes (Signed)
[address]            [place] [address]          [id number]       Joe Shah [facility] Medical Record #[id number] Date of Birth: 1941-02-06  Joe Ouch, MD BUTLER,CYNTHIA, DO, DO  Chief Complaint:   PostOp Follow Up Visit PREOPERATIVE DIAGNOSIS: Critical aortic stenosis, and moderate aortic  insufficiency.  POSTOPERATIVE DIAGNOSIS: Critical aortic stenosis, and moderate aortic  insufficiency.  SURGICAL PROCEDURE: Aortic valve replacement with a pericardial tissue  valve, [employer], model 3300TFX 23-mm, serial [id number].    History of Present Illness:     Patient returns to the office today in followup after his recent aortic valve replacement for critical aortic stenosis.  He has been doing extremely well and is anxious to return to his usual working out regiment.   History  Smoking status  . Former Smoker -- 0.5 packs/day for 35 years  . Types: Cigarettes  Smokeless tobacco  . Current User  . Types: Snuff  Comment: quit smoking 10+yrs ago;dips snuff       No Known Allergies  Current Outpatient Prescriptions  Medication Sig Dispense Refill  . amiodarone (PACERONE) 400 MG tablet Take 400 mg po bid x 1 week, then 200 mg po bid thereafter  90 tablet  1  . aspirin 325 MG tablet Take 325 mg by mouth daily.      Marland Kitchen glucosamine-chondroitin 500-400 MG tablet Take 2 tablets by mouth daily.        Marland Kitchen lisinopril (PRINIVIL,ZESTRIL) 5 MG tablet Take 1 tablet (5 mg total) by mouth 2 (two) times daily.  60 tablet  1  . metoprolol tartrate (LOPRESSOR) 25 MG tablet Take 1 tablet (25 mg total) by mouth 2 (two) times daily.  60 tablet  1  . simvastatin (ZOCOR) 20 MG tablet Take 20 mg by mouth at bedtime.             Physical Exam: BP 125/71  Pulse 76  Resp 18  Ht 5\' 9"  (1.753 m)  Wt 180 lb (81.647 kg)  BMI 26.58 kg/m2  SpO2 95%  General appearance: alert, cooperative and no distress Neurologic: intact Heart:  regular rate and rhythm, S1, S2 normal, no murmur, click, rub or gallop Lungs: clear to auscultation bilaterally Abdomen: soft, non-tender; bowel sounds normal; no masses,  no organomegaly Extremities: extremities normal, atraumatic, no cyanosis or edema, Homans sign is negative, no sign of DVT and no edema, redness or tenderness in the calves or thighs Wound: Sternum is stable and well healed  Diagnostic Studies & Laboratory data:         Recent Radiology Findings: Dg Chest 2 View  09/10/2011  *RADIOLOGY REPORT*  Clinical Data: History of cardiac valve replacement, follow-up  CHEST - 2 VIEW  Comparison: Chest x-ray of 08/21/2011  Findings: The lungs are clear.  Small effusions noted previously have resolved and aeration has improved.  Mild cardiomegaly is stable.  An aortic valve replacement is noted.  IMPRESSION: No active lung disease.  Resolution of basilar atelectasis and small effusions.  Original Report Authenticated By: Juline Patch, M.D.      Recent Labs: Lab Results  Component Value Date   WBC 8.8 08/21/2011   HGB 9.7* 08/21/2011   HCT 29.7* 08/21/2011   PLT 151 08/21/2011   GLUCOSE 135* 08/21/2011  ALT 14 08/14/2011   AST 19 08/14/2011   NA 136 08/21/2011   K 3.4* 08/21/2011   CL 101 08/21/2011   CREATININE 0.81 08/21/2011   BUN 17 08/21/2011   CO2 26 08/21/2011   INR 1.72* 08/18/2011   HGBA1C 5.7* 08/14/2011      Assessment / Plan:     Patient is doing extremely well postoperatively, after aortic valve replacement with a tissue valve He will discuss with cardiology decreasing his dose of Cordarone. In the office today he seemed to be confused about his medications and may have been taking lisinopril and quinapril simultaneously. He will take his medication bottles with him to his cardiology appointment tomorrow to make sure his medications are straight. I've encouraged him to enroll in cardiac rehabilitation program in [facility] reviewed with him not to do any heavy lifting for  2-3 months I've also reviewed with him endocarditis precautions Overall very pleased with his progress I've not made him a return appointment to see me as he will continue to be seen by cardiology in [place].  Delight Ovens MD 09/10/2011 3:02 PM

## 2011-09-11 ENCOUNTER — Ambulatory Visit (INDEPENDENT_AMBULATORY_CARE_PROVIDER_SITE_OTHER): Payer: Medicare Other | Admitting: Physician Assistant

## 2011-09-11 ENCOUNTER — Encounter: Payer: Self-pay | Admitting: Physician Assistant

## 2011-09-11 VITALS — BP 120/79 | HR 71 | Ht 69.0 in | Wt 186.0 lb

## 2011-09-11 DIAGNOSIS — I4891 Unspecified atrial fibrillation: Secondary | ICD-10-CM

## 2011-09-11 DIAGNOSIS — I480 Paroxysmal atrial fibrillation: Secondary | ICD-10-CM | POA: Insufficient documentation

## 2011-09-11 DIAGNOSIS — I10 Essential (primary) hypertension: Secondary | ICD-10-CM

## 2011-09-11 DIAGNOSIS — Z954 Presence of other heart-valve replacement: Secondary | ICD-10-CM

## 2011-09-11 DIAGNOSIS — Z952 Presence of prosthetic heart valve: Secondary | ICD-10-CM

## 2011-09-11 MED ORDER — QUINAPRIL HCL 20 MG PO TABS: 20.0000 mg | ORAL_TABLET | Freq: Every day | ORAL | Status: DC

## 2011-09-11 MED ORDER — LISINOPRIL 5 MG PO TABS: 10.0000 mg | ORAL_TABLET | Freq: Two times a day (BID) | ORAL | Status: DC

## 2011-09-11 MED ORDER — AMIODARONE HCL 400 MG PO TABS: 200.0000 mg | ORAL_TABLET | Freq: Every day | ORAL | Status: DC

## 2011-09-11 NOTE — Assessment & Plan Note (Signed)
Maintaining NSR on amiodarone, status post recent transient episode of PAF, following surgery. Will decrease amiodarone to 200 mg daily, and then discontinue altogether. Dr. Tyrone Sage did not recommend Coumadin anticoagulation to the patient, during yesterday's followup visit. Therefore, will continue full dose aspirin, indefinitely. We'll schedule return visit with Dr. [name] in 3 months.

## 2011-09-11 NOTE — Assessment & Plan Note (Signed)
Patient has been released by Dr. Tyrone Sage, following yesterday's scheduled followup visit. He is considering short-term cardiac rehabilitation, but is otherwise doing extremely well on his own.

## 2011-09-11 NOTE — Assessment & Plan Note (Signed)
Patiently currently on 2 ACE inhibitors. Therefore, have instructed him to double his current dose of lisinopril, which was started during recent hospitalization, while discontinuing quinapril. After lisinopril medication is finished, the patient is then to resume quinapril at the previous dose of 20 mg daily.
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
## 2011-09-11 NOTE — Patient Instructions (Signed)
Your physician wants you to follow-up in: 3 months. You will receive a reminder letter in the mail one-two months in advance. If you don't receive a letter, please call our office to schedule the follow-up appointment. Increase Lisinopril to 2 tablets two times a day until gone. Hold Quinapril while taking Lisinopril. After you finish your current supply of Lisinopril, resume Quinapril at 20 mg daily. A new prescription was sent to your pharmacy to reflect this change.  Decrease Amiodarone to 200 mg daily for 1 month and then stop.

## 2012-01-11 ENCOUNTER — Ambulatory Visit (INDEPENDENT_AMBULATORY_CARE_PROVIDER_SITE_OTHER): Payer: Medicare Other | Admitting: Physician Assistant

## 2012-01-11 ENCOUNTER — Encounter: Payer: Self-pay | Admitting: Physician Assistant

## 2012-01-11 VITALS — BP 118/74 | HR 86 | Ht 68.0 in | Wt 192.8 lb

## 2012-01-11 DIAGNOSIS — I4891 Unspecified atrial fibrillation: Secondary | ICD-10-CM

## 2012-01-11 DIAGNOSIS — I480 Paroxysmal atrial fibrillation: Secondary | ICD-10-CM

## 2012-01-11 DIAGNOSIS — Z954 Presence of other heart-valve replacement: Secondary | ICD-10-CM

## 2012-01-11 DIAGNOSIS — I2510 Atherosclerotic heart disease of native coronary artery without angina pectoris: Secondary | ICD-10-CM | POA: Insufficient documentation

## 2012-01-11 DIAGNOSIS — Z952 Presence of prosthetic heart valve: Secondary | ICD-10-CM

## 2012-01-11 DIAGNOSIS — I10 Essential (primary) hypertension: Secondary | ICD-10-CM

## 2012-01-11 MED ORDER — QUINAPRIL HCL 40 MG PO TABS: 40.0000 mg | ORAL_TABLET | Freq: Every day | ORAL | Status: DC

## 2012-01-11 NOTE — Patient Instructions (Signed)
   Begin Quinapril 40mg  daily Continue all other current medications.  Your physician wants you to follow up in: 6 months.  You will receive a reminder letter in the mail one-two months in advance.  If you don't receive a letter, please call our office to schedule the follow up appointment

## 2012-01-11 NOTE — Assessment & Plan Note (Signed)
Stable status post bioprosthetic AVR, secondary to severe AS. Reestablish with Dr. Andee Lineman for routine followup, in 6 months.

## 2012-01-11 NOTE — Assessment & Plan Note (Signed)
Resolved, status post postop treatment with amiodarone, since discontinued. Continue current dose Lopressor and ASA.

## 2012-01-11 NOTE — Progress Notes (Signed)
Addended by: Lesle Chris on: 01/11/2012 02:01 PM   Modules accepted: Orders
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
## 2012-01-11 NOTE — Assessment & Plan Note (Signed)
Well-controlled on current regimen. Renew prescription for quinapril at increased dose of 40 mg daily. Check metabolic profile for monitoring of electrolytes/renal function, following recent up titration.

## 2012-01-11 NOTE — Assessment & Plan Note (Signed)
Nonobstructive by recent catheterization. Continue aggressive secondary prevention measures.

## 2012-02-18 ENCOUNTER — Telehealth: Payer: Self-pay | Admitting: *Deleted

## 2012-02-18 NOTE — Telephone Encounter (Signed)
Message copied by Lesle Chris on Thu Feb 18, 2012  3:11 PM ------      Message from: Prescott Parma C      Created: Thu Feb 18, 2012 12:44 PM       NL labs

## 2012-02-18 NOTE — Telephone Encounter (Signed)
Notes Recorded by Lesle Chris, LPN on 11/09/8117 at 3:11 PM Patient notified and verbalized understanding.   Notes Recorded by Lesle Chris, LPN on 1/47/8295 at 1:43 PM Left message to return call.

## 2012-02-27 IMAGING — CR DG CHEST 2V
2 series · 2 of 2 positions shown · non-contrast
Comparison: Chest radiograph 08/20/2011

CLINICAL DATA: Aortic valve replacement

CHEST - 2 VIEW

[w chest pa]
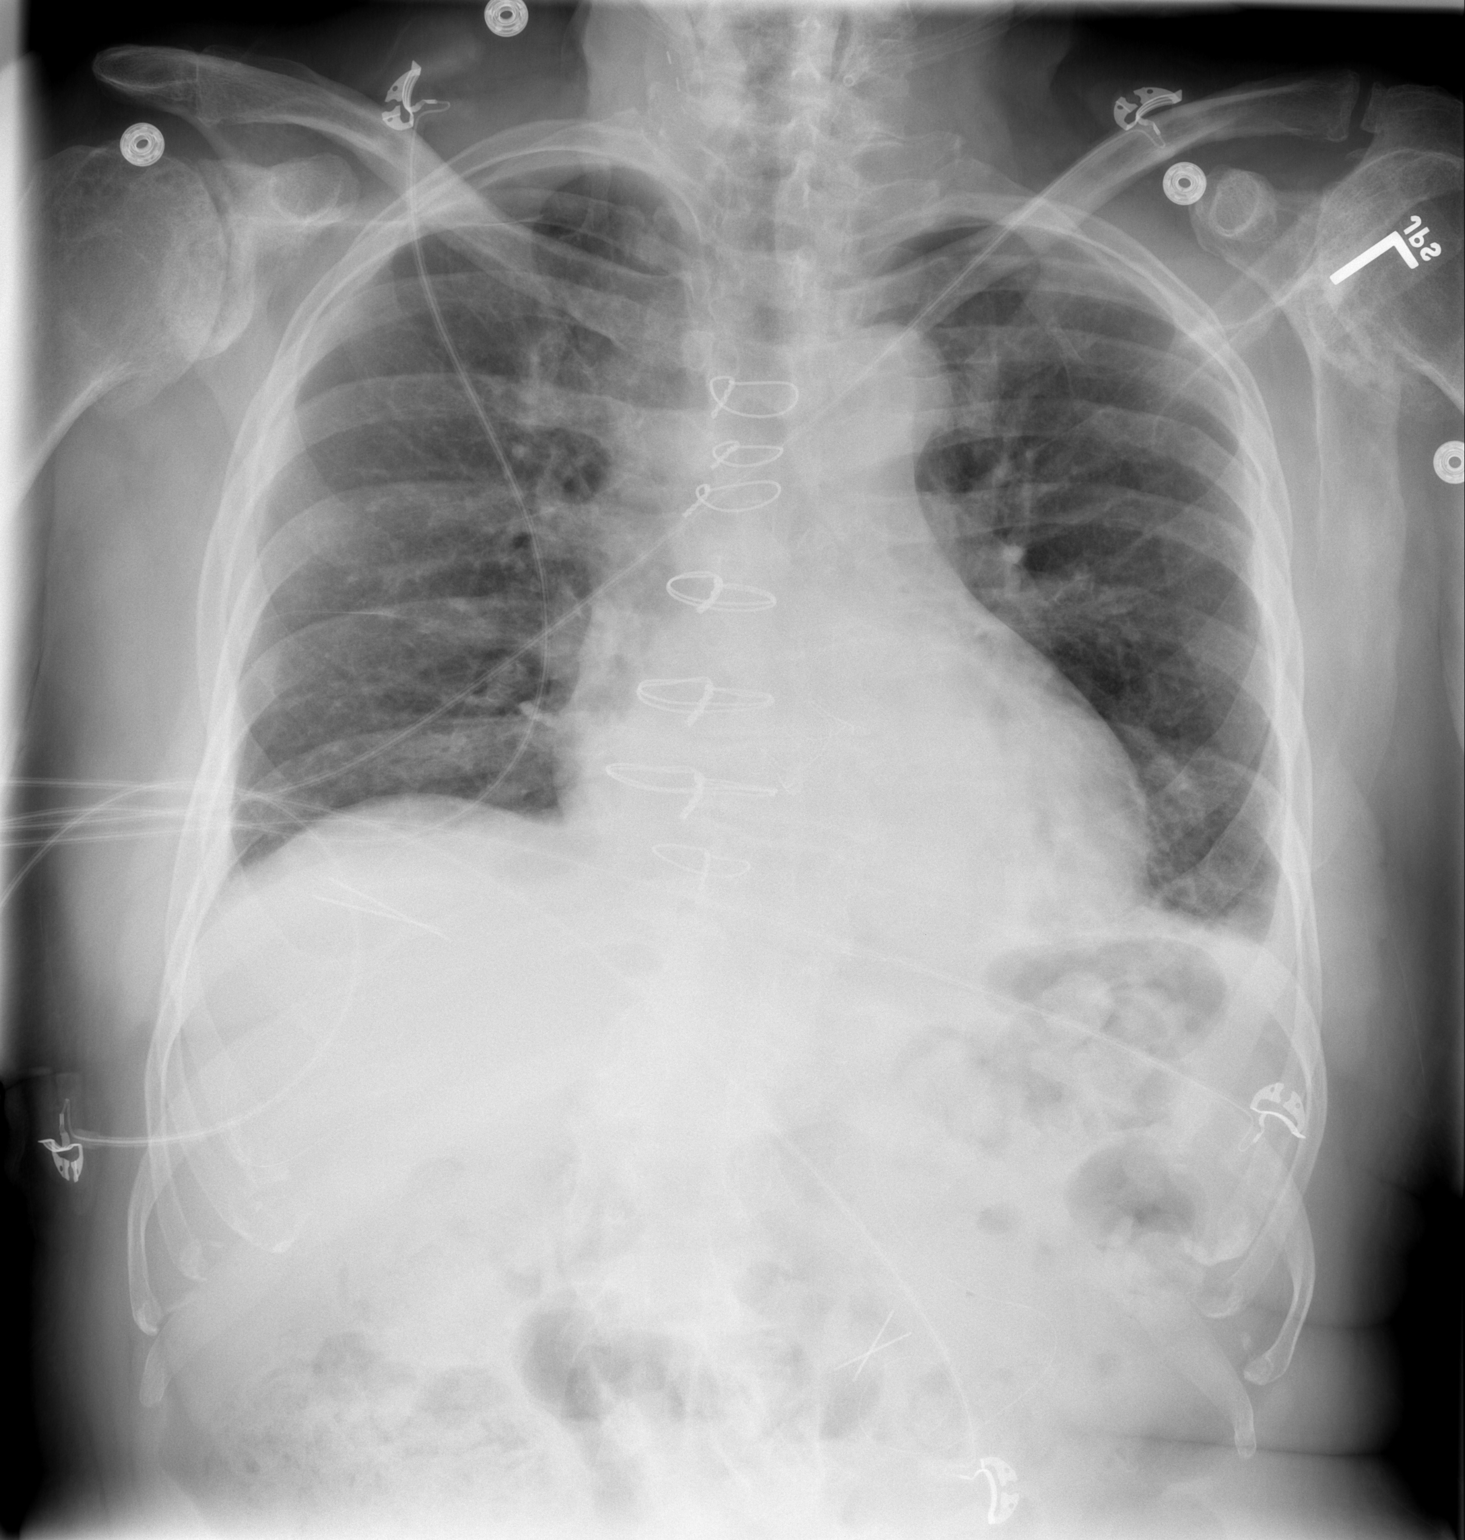

[w chest lat]
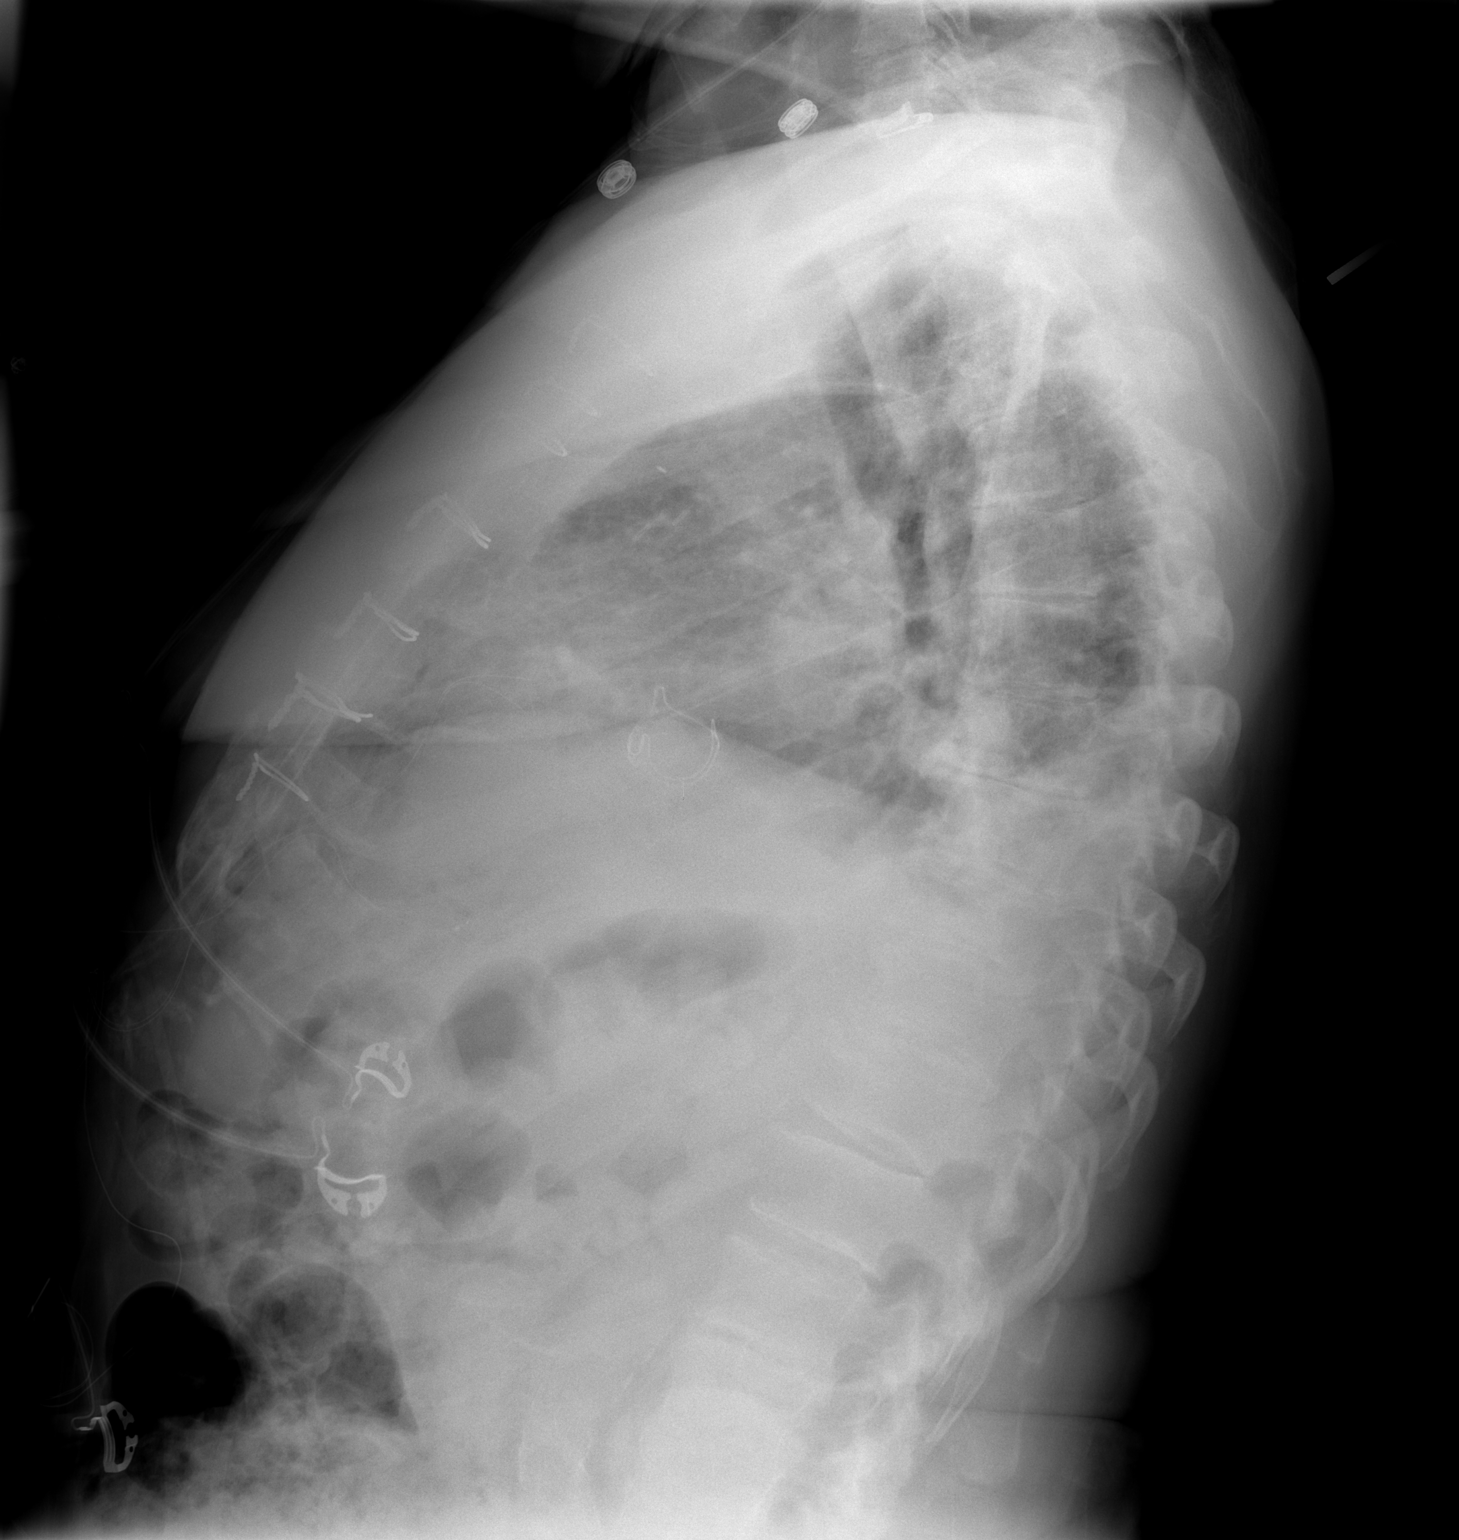

[2 of 2 positions shown; findings below may reference images not displayed]

FINDINGS: Sternotomy wires overlie stable enlarged cardiac
silhouette.  There is improved aeration to the left lung base.  No
pulmonary edema.  No pneumothorax.  Small bilateral pleural
effusions persist.
IMPRESSION: Improved aeration to the left lung base.  No pneumothorax.

## 2012-12-14 ENCOUNTER — Encounter: Payer: Self-pay | Admitting: Cardiology

## 2012-12-14 ENCOUNTER — Ambulatory Visit (INDEPENDENT_AMBULATORY_CARE_PROVIDER_SITE_OTHER): Payer: Medicare Other | Admitting: Cardiology

## 2012-12-14 VITALS — BP 118/73 | HR 65 | Ht 70.0 in | Wt 193.1 lb

## 2012-12-14 DIAGNOSIS — I4891 Unspecified atrial fibrillation: Secondary | ICD-10-CM

## 2012-12-14 DIAGNOSIS — I2510 Atherosclerotic heart disease of native coronary artery without angina pectoris: Secondary | ICD-10-CM

## 2012-12-14 DIAGNOSIS — Z952 Presence of prosthetic heart valve: Secondary | ICD-10-CM

## 2012-12-14 DIAGNOSIS — I10 Essential (primary) hypertension: Secondary | ICD-10-CM

## 2012-12-14 DIAGNOSIS — I480 Paroxysmal atrial fibrillation: Secondary | ICD-10-CM

## 2012-12-14 DIAGNOSIS — Z954 Presence of other heart-valve replacement: Secondary | ICD-10-CM

## 2012-12-14 NOTE — Patient Instructions (Signed)
   Echo  Office will call with results  Call office in 2 weeks (around May 28) to give blood pressure readings   Continue all current medications. Your physician wants you to follow up in:  1 year.  You will receive a reminder letter in the mail one-two months in advance.  If you don't receive a letter, please call our office to schedule the follow up appointment

## 2012-12-15 NOTE — Progress Notes (Signed)
Patient ID: AJ CRUNKLETON, male   DOB: 03-19-1941, 72 y.o.   MRN: [id number] PCP: Dr. Charm Barges  72 yo with history of aortic stenosis s/p bioprosthetic AVR and HTN presents for cardiology followup.  AVR was in 1/13.  He did not have significant CAD on pre-op cath.  He did have post-op atrial fibrillation but it has not been detected since and he is not on anticoagulation.    In general, he has been doing well.  He had chest pain at rest about a month ago associated with a spike in his blood pressure.  Pressure has come down and he has had no recurrent chest pain.  He has no exertional dyspnea and is active.  He does some jogging on a treadmill.  BP is good today.  No tachypalpitations.   Labs (10/13): K 4.5, creatinine 1.1, LDL 129, HDL 64  ECG: NSR, poor anterior R wave progression.   PMH: 1. Microscopic hematuria 2. Hyperlipidemia 3. HTN 4. ADD 5. H/o partial thyroidectomy 6. Aortic stenosis: AVR 1/13 with bioprosthetic aortic valve.   7. Atrial fibrillation: Post-op AVR, no recurrence.  8. CAD: Nonobstructive on pre-op cath in 1/13.   SH: Married, quit smoking in 2003, lives in [place].  FH: Mother with CHF  ROS: All systems reviewed and negative except as per HPI.   Current Outpatient Prescriptions  Medication Sig Dispense Refill  . aspirin 81 MG tablet Take 81 mg by mouth daily.      . meloxicam (MOBIC) 15 MG tablet Take 15 mg by mouth daily.      . quinapril (ACCUPRIL) 40 MG tablet Take 1 tablet (40 mg total) by mouth daily.  30 tablet  6  . simvastatin (ZOCOR) 80 MG tablet Take 80 mg by mouth at bedtime.       No current facility-administered medications for this visit.    BP 118/73  Pulse 65  Ht 5\' 10"  (1.778 m)  Wt 193 lb 1.3 oz (87.581 kg)  BMI 27.7 kg/m2 General: NAD Neck: No JVD, no thyromegaly or thyroid nodule.  Lungs: Clear to auscultation bilaterally with normal respiratory effort. CV: Nondisplaced PMI.  Heart regular S1/S2, no S3/S4, 1/6 SEM RUSB.  No  peripheral edema.  No carotid bruit.  Normal pedal pulses.  Abdomen: Soft, nontender, no hepatosplenomegaly, no distention.  Neurologic: Alert and oriented x 3.  Psych: Normal affect. Extremities: No clubbing or cyanosis.   Assessment/Plan: 1. Bioprosthetic aortic valve: Valve replaced for AS.  Will need to look back and see if valve is bicuspid (do we need aortic screening?).  He never had an echo post-op, so will get an echocardiogram to check baseline valve gradients.  Continue ASA 81.  2. HTN: BP good today, seems to run high at home at times.  I will have him check his BP daily for 2 wks and we will call him at that time to see what his numbers have been running. Continue quinapril for now.  3. Hyperlipidemia: LDL was a bit high in 10/13.  He will get lipids again soon from Dr. Charm Barges and will ask her to send [place] a copy.  4. Paroxysmal atrial fibrillation: Noted only post-surgery.   If recurs, will need anticoagulation.   Marca Ancona 12/15/2012 9:15 AM

## 2012-12-22 ENCOUNTER — Other Ambulatory Visit: Payer: Self-pay

## 2012-12-22 ENCOUNTER — Other Ambulatory Visit (INDEPENDENT_AMBULATORY_CARE_PROVIDER_SITE_OTHER): Payer: Medicare Other

## 2012-12-22 DIAGNOSIS — Z954 Presence of other heart-valve replacement: Secondary | ICD-10-CM

## 2012-12-22 DIAGNOSIS — I2510 Atherosclerotic heart disease of native coronary artery without angina pectoris: Secondary | ICD-10-CM

## 2012-12-22 DIAGNOSIS — I4891 Unspecified atrial fibrillation: Secondary | ICD-10-CM

## 2012-12-22 DIAGNOSIS — Z952 Presence of prosthetic heart valve: Secondary | ICD-10-CM

## 2013-01-02 ENCOUNTER — Encounter: Payer: Self-pay | Admitting: *Deleted

## 2013-06-19 ENCOUNTER — Telehealth: Payer: Self-pay | Admitting: Family Medicine

## 2013-06-19 NOTE — Telephone Encounter (Signed)
appt given with newton on wed at 9:45

## 2013-06-21 ENCOUNTER — Encounter: Payer: Self-pay | Admitting: Family Medicine

## 2013-06-21 ENCOUNTER — Ambulatory Visit (INDEPENDENT_AMBULATORY_CARE_PROVIDER_SITE_OTHER): Payer: Medicare Other | Admitting: Family Medicine

## 2013-06-21 VITALS — BP 146/87 | HR 64 | Temp 97.7°F | Ht 70.0 in | Wt 193.0 lb

## 2013-06-21 DIAGNOSIS — Z79899 Other long term (current) drug therapy: Secondary | ICD-10-CM

## 2013-06-21 DIAGNOSIS — E785 Hyperlipidemia, unspecified: Secondary | ICD-10-CM

## 2013-06-21 DIAGNOSIS — I10 Essential (primary) hypertension: Secondary | ICD-10-CM

## 2013-06-21 DIAGNOSIS — Z23 Encounter for immunization: Secondary | ICD-10-CM

## 2013-06-21 DIAGNOSIS — E538 Deficiency of other specified B group vitamins: Secondary | ICD-10-CM

## 2013-06-21 NOTE — Patient Instructions (Addendum)
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
## 2013-06-22 LAB — NMR, LIPOPROFILE
Cholesterol: 195 mg/dL (ref <200)
HDL Particle Number: 39.7 umol/L (ref >=30.5)
LDLC SERPL CALC-MCNC: 116 mg/dL — ABNORMAL HIGH (ref <100)
LP-IR Score: 47 — ABNORMAL HIGH (ref <=45)
Small LDL Particle Number: 819 nmol/L — ABNORMAL HIGH (ref <=527)

## 2013-06-22 LAB — COMPREHENSIVE METABOLIC PANEL
ALT: 17 IU/L (ref 0–44)
AST: 18 IU/L (ref 0–40)
Albumin/Globulin Ratio: 2.6 — ABNORMAL HIGH (ref 1.1–2.5)
Calcium: 9.4 mg/dL (ref 8.6–10.2)
Chloride: 101 mmol/L (ref 97–108)
GFR calc non Af Amer: 70 mL/min/{1.73_m2} (ref >59)
Potassium: 4.6 mmol/L (ref 3.5–5.2)
Sodium: 139 mmol/L (ref 134–144)
Total Bilirubin: 0.6 mg/dL (ref 0.0–1.2)

## 2013-06-22 NOTE — Addendum Note (Signed)
Addended by: Gwenith Daily on: 06/22/2013 03:53 PM   Modules accepted: Orders

## 2013-06-27 ENCOUNTER — Other Ambulatory Visit: Payer: Self-pay | Admitting: Family Medicine

## 2013-06-27 DIAGNOSIS — E785 Hyperlipidemia, unspecified: Secondary | ICD-10-CM

## 2013-06-27 MED ORDER — EZETIMIBE 10 MG PO TABS: 10.0000 mg | ORAL_TABLET | Freq: Every day | ORAL | Status: DC

## 2013-06-28 ENCOUNTER — Telehealth: Payer: Self-pay | Admitting: Family Medicine

## 2013-07-25 ENCOUNTER — Other Ambulatory Visit: Payer: Self-pay

## 2013-07-25 MED ORDER — SIMVASTATIN 80 MG PO TABS: 80.0000 mg | ORAL_TABLET | Freq: Every day | ORAL | Status: DC

## 2013-07-25 MED ORDER — MELOXICAM 15 MG PO TABS: 15.0000 mg | ORAL_TABLET | Freq: Every day | ORAL | Status: DC

## 2013-07-25 MED ORDER — QUINAPRIL HCL 40 MG PO TABS: 40.0000 mg | ORAL_TABLET | Freq: Every day | ORAL | Status: DC

## 2013-09-04 ENCOUNTER — Encounter: Payer: Self-pay | Admitting: *Deleted

## 2013-09-21 ENCOUNTER — Ambulatory Visit: Payer: Medicare Other | Admitting: Family Medicine

## 2013-09-22 ENCOUNTER — Ambulatory Visit: Payer: Medicare Other | Admitting: Family Medicine

## 2013-12-08 ENCOUNTER — Encounter: Payer: Self-pay | Admitting: *Deleted

## 2013-12-08 ENCOUNTER — Ambulatory Visit (INDEPENDENT_AMBULATORY_CARE_PROVIDER_SITE_OTHER): Payer: Medicare Other | Admitting: Cardiovascular Disease

## 2013-12-08 VITALS — BP 164/92 | HR 65 | Ht 70.0 in | Wt 197.0 lb

## 2013-12-08 DIAGNOSIS — Z953 Presence of xenogenic heart valve: Secondary | ICD-10-CM

## 2013-12-08 DIAGNOSIS — I10 Essential (primary) hypertension: Secondary | ICD-10-CM

## 2013-12-08 DIAGNOSIS — I480 Paroxysmal atrial fibrillation: Secondary | ICD-10-CM

## 2013-12-08 DIAGNOSIS — I4891 Unspecified atrial fibrillation: Secondary | ICD-10-CM

## 2013-12-08 DIAGNOSIS — Z952 Presence of prosthetic heart valve: Secondary | ICD-10-CM

## 2013-12-08 MED ORDER — AMLODIPINE BESYLATE 5 MG PO TABS: 5.0000 mg | ORAL_TABLET | Freq: Every day | ORAL | Status: DC
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
## 2013-12-08 NOTE — Patient Instructions (Signed)
   Begin Norvasc 5mg  daily - new sent to pharm Continue all other medications.   Your physician wants you to follow up in:  1 year.  You will receive a reminder letter in the mail one-two months in advance.  If you don't receive a letter, please call our office to schedule the follow up appointment

## 2014-03-12 ENCOUNTER — Telehealth: Payer: Self-pay | Admitting: Cardiovascular Disease

## 2014-03-12 MED ORDER — AMLODIPINE BESYLATE 5 MG PO TABS: 5.0000 mg | ORAL_TABLET | Freq: Every day | ORAL | Status: DC

## 2014-03-12 MED ORDER — QUINAPRIL HCL 40 MG PO TABS: 40.0000 mg | ORAL_TABLET | Freq: Every day | ORAL | Status: DC

## 2014-03-12 NOTE — Telephone Encounter (Signed)
Needs blood pressure medication called to [facility].

## 2014-04-06 ENCOUNTER — Telehealth: Payer: Self-pay | Admitting: Cardiovascular Disease

## 2014-04-06 MED ORDER — AMLODIPINE BESYLATE 5 MG PO TABS: 5.0000 mg | ORAL_TABLET | Freq: Every day | ORAL | Status: DC

## 2014-04-06 NOTE — Telephone Encounter (Signed)
amLODipine (NORVASC) 5 MG tablet   Asking for a 90 day supply [facility]

## 2014-04-06 NOTE — Telephone Encounter (Signed)
Done

## 2014-12-18 ENCOUNTER — Ambulatory Visit: Payer: Medicare Other | Admitting: Cardiovascular Disease

## 2015-07-22 ENCOUNTER — Encounter: Payer: Self-pay | Admitting: Cardiovascular Disease

## 2015-07-22 ENCOUNTER — Ambulatory Visit (INDEPENDENT_AMBULATORY_CARE_PROVIDER_SITE_OTHER): Payer: Medicare Other | Admitting: Cardiovascular Disease

## 2015-07-22 VITALS — BP 138/81 | HR 68 | Ht 70.0 in | Wt 206.0 lb

## 2015-07-22 DIAGNOSIS — Z953 Presence of xenogenic heart valve: Secondary | ICD-10-CM

## 2015-07-22 DIAGNOSIS — I480 Paroxysmal atrial fibrillation: Secondary | ICD-10-CM

## 2015-07-22 DIAGNOSIS — E785 Hyperlipidemia, unspecified: Secondary | ICD-10-CM

## 2015-07-22 DIAGNOSIS — Z954 Presence of other heart-valve replacement: Secondary | ICD-10-CM

## 2015-07-22 DIAGNOSIS — I10 Essential (primary) hypertension: Secondary | ICD-10-CM | POA: Diagnosis not present

## 2015-07-22 NOTE — Patient Instructions (Signed)
Continue all current medications. Your physician wants you to follow up in:  1 1/2 years.  You will receive a reminder letter in the mail one-two months in advance.  If you don't receive a letter, please call our office to schedule the follow up appointment

## 2015-07-22 NOTE — Progress Notes (Signed)
Patient ID: Joe Shah, male   DOB: 1941/02/07, 74 y.o.   MRN: [id number]      SUBJECTIVE: The patient is a 74 year old gentleman with a history of aortic stenosis s/p bioprosthetic aortic valve replacement and hypertension, who presents for cardiology followup. AVR was in 08/2011. He did not have significant CAD on pre-op cath. He did have post-op atrial fibrillation but it has not been detected since and he is not on anticoagulation. I last saw him in 12/2013.  His last echocardiogram was in May 2014 which revealed normal left ventricular systolic function, EF Q000111Q, grade 1 diastolic dysfunction, and a normally functioning bioprosthetic aortic valve.  ECG performed in the office today demonstrates normal sinus rhythm with no ischemic ST segment or T-wave abnormalities, nor any arrhythmias.   He has been feeling well and denies chest pain. He has developed some shortness of breath with wheezing but it hasn't been particularly bothersome.   He is a retired Psychologist, sport and exercise.    PMH:  1. Microscopic hematuria  2. Hyperlipidemia  3. HTN  4. ADD  5. H/o partial thyroidectomy  6. Aortic stenosis: AVR 08/2011 with bioprosthetic aortic valve.  7. Atrial fibrillation: Post-op AVR, no recurrence.  8. CAD: Nonobstructive on pre-op cath in 1/13.   SH: Married, quit smoking in 2003, lives in [place].     Review of Systems: As per "subjective", otherwise negative.  No Known Allergies  Current Outpatient Prescriptions  Medication Sig Dispense Refill  . amLODipine (NORVASC) 10 MG tablet Take 1 tablet by mouth daily.    Marland Kitchen aspirin 81 MG tablet Take 81 mg by mouth daily.    . meloxicam (MOBIC) 15 MG tablet Take 15 mg by mouth as needed.    . Pectin POWD 1 each by Does not apply route.    . simvastatin (ZOCOR) 20 MG tablet Take 1 tablet by mouth daily.     No current facility-administered medications for this visit.    Past Medical History  Diagnosis Date  . Aortic valve disorders     . Other and unspecified hyperlipidemia   . Other B-complex deficiencies   . Microscopic hematuria     managed by medical MD Dr.Cynthia Melina Copa in [place]  . Hyperlipidemia     takes Zocor daily  . Unspecified essential hypertension     takes Accupril daily  . Aortic stenosis   . Heart murmur   . Shortness of breath     with exertion   . Head injury     at age 14 from [place]  . Arthritis   . Scoliosis   . Bruises easily   . Urinary frequency   . ADD (attention deficit disorder with hyperactivity)     Past Surgical History  Procedure Laterality Date  . Knee arthroscopy  15+yrs ago;2012    x 2 left  . Thyroidectomy, partial  20+yrs ago  . Tonsillectomy      as a child  . Back surgery  2009  . Cardiac catheterization  2008    moderate AS. Normal coronary arteries except for mild plaque in LAD  . Colonoscopy    . Hemorrhoid surgery  30+yrs ago  . Aortic valve replacement  08/18/2011    Procedure: AORTIC VALVE REPLACEMENT (AVR);  Surgeon: Grace Isaac, MD;  Location: [facility];  Service: Open Heart Surgery;  Laterality: N/A;  On pump.    Social History   Social History  . Marital Status: Married    Spouse Name:  [name]  . Number of Children: 3  . 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 Smoking status: Former Smoker -- 0.50 packs/day for 35 years    Types: Cigarettes    Start date: 12/29/1956    Quit date: 08/03/2001  . Smokeless tobacco: Former Systems developer    Types: Snuff    Quit date: 08/17/2011     Comment: quit smoking 10+yrs ago;dips snuff  . Alcohol Use: No  . Drug Use: No  . Sexual Activity: Yes   Other Topics Concern  . Not on file   Social History Narrative     Filed Vitals:   07/22/15 0834 07/22/15 0845  BP: 155/56 138/81  Pulse: 81 68  Height: 5\' 10"  (1.778 m)   Weight: 206 lb (93.441 kg)    Repeat BP: 138/81 with larger cuff  PHYSICAL EXAM General: NAD HEENT:  Normal. Neck: No JVD, no thyromegaly. Lungs: Clear to auscultation bilaterally with normal respiratory effort. CV: Nondisplaced PMI.  Regular rate and rhythm, normal S1/S2, no S3/S4, no murmur. Trace pretibial  edema.  No carotid bruit.   Abdomen: Soft, nontender, no distention.  Neurologic: Alert and oriented x 3.  Psych: Normal affect. Skin: Normal. Musculoskeletal: Normal range of motion, no gross deformities. Extremities: No clubbing or cyanosis.   ECG: Most recent ECG reviewed.      ASSESSMENT AND PLAN: 1. Bioprosthetic aortic valve: Valve replaced for aortic stenosis. Echo in May 2014 demonstrated normal bioprosthetic valve function. Continue ASA 81 mg daily.   2. Essential HTN: Controlled with larger cuff measurement. No longer on quinapril. Taking amlodipine 10 mg daily.  3. Hyperlipidemia: Followed by PCP. Currently on simvastatin 20 mg daily.  4. Paroxysmal atrial fibrillation: Noted only post-surgery. If recurs, will need anticoagulation.   Dispo: f/u 1.5 years.   Kate Sable, M.D., F.A.C.C.

## 2015-10-03 ENCOUNTER — Ambulatory Visit (INDEPENDENT_AMBULATORY_CARE_PROVIDER_SITE_OTHER): Payer: Medicare Other | Admitting: Cardiovascular Disease

## 2015-10-03 ENCOUNTER — Encounter: Payer: Self-pay | Admitting: Cardiovascular Disease

## 2015-10-03 VITALS — BP 130/76 | HR 69 | Ht 70.0 in | Wt 206.0 lb

## 2015-10-03 DIAGNOSIS — Z953 Presence of xenogenic heart valve: Secondary | ICD-10-CM

## 2015-10-03 DIAGNOSIS — R600 Localized edema: Secondary | ICD-10-CM | POA: Diagnosis not present

## 2015-10-03 DIAGNOSIS — I480 Paroxysmal atrial fibrillation: Secondary | ICD-10-CM | POA: Diagnosis not present

## 2015-10-03 DIAGNOSIS — I10 Essential (primary) hypertension: Secondary | ICD-10-CM

## 2015-10-03 DIAGNOSIS — R0602 Shortness of breath: Secondary | ICD-10-CM

## 2015-10-03 DIAGNOSIS — Z954 Presence of other heart-valve replacement: Secondary | ICD-10-CM | POA: Diagnosis not present

## 2015-10-03 DIAGNOSIS — E785 Hyperlipidemia, unspecified: Secondary | ICD-10-CM

## 2015-10-03 MED ORDER — VALSARTAN 160 MG PO TABS: 160.0000 mg | ORAL_TABLET | Freq: Every day | ORAL | Status: DC

## 2015-10-03 NOTE — Progress Notes (Signed)
Patient ID: Joe Shah, male   DOB: 1941-05-30, 75 y.o.   MRN: [id number]      SUBJECTIVE: The patient is a 75 year old gentleman with a history of aortic stenosis s/p bioprosthetic aortic valve replacement and hypertension, who presents for cardiology followup. AVR was in 08/2011. He did not have significant CAD on pre-op cath. He did have post-op atrial fibrillation but it has not been detected since and he is not on anticoagulation. I last saw him in 07/2015 and he was doing well.  His last echocardiogram was in May 2014 which revealed normal left ventricular systolic function, EF Q000111Q, grade 1 diastolic dysfunction, and a normally functioning bioprosthetic aortic valve.  He tells me he has had shortness of breath which began about 6 months ago. He recently developed bilateral leg swelling and admits that his amlodipine dose was recently increased by his PCP. No orthopnea or PND. No chest pains.   PMH:  1. Microscopic hematuria  2. Hyperlipidemia  3. HTN  4. ADD  5. H/o partial thyroidectomy  6. Aortic stenosis: AVR 08/2011 with bioprosthetic aortic valve.  7. Atrial fibrillation: Post-op AVR, no recurrence.  8. CAD: Nonobstructive on pre-op cath in 1/13.   SH: Married, quit smoking in 2003, lives in [place].     Review of Systems: As per "subjective", otherwise negative.  No Known Allergies  Current Outpatient Prescriptions  Medication Sig Dispense Refill  . amLODipine (NORVASC) 10 MG tablet Take 1 tablet by mouth daily.    Marland Kitchen aspirin 81 MG tablet Take 81 mg by mouth daily.    . ciprofloxacin (CIPRO) 500 MG tablet Take 1 tablet by mouth 2 (two) times daily.    Marland Kitchen HYDROcodone-acetaminophen (NORCO) 10-325 MG tablet Take 1 tablet by mouth every 6 (six) hours as needed.    . meloxicam (MOBIC) 15 MG tablet Take 15 mg by mouth as needed.    . Pectin POWD 1 each by Does not apply route.    . simvastatin (ZOCOR) 20 MG tablet Take 1 tablet by mouth daily.    . tamsulosin  (FLOMAX) 0.4 MG CAPS capsule Take 1 capsule by mouth daily.     No current facility-administered medications for this visit.    Past Medical History  Diagnosis Date  . Aortic valve disorders   . Other and unspecified hyperlipidemia   . Other B-complex deficiencies   . Microscopic hematuria     managed by medical MD Dr.Cynthia Melina Copa in [place]  . Hyperlipidemia     takes Zocor daily  . Unspecified essential hypertension     takes Accupril daily  . Aortic stenosis   . Heart murmur   . Shortness of breath     with exertion   . Head injury     at age 23 from [place]  . Arthritis   . Scoliosis   . Bruises easily   . Urinary frequency   . ADD (attention deficit disorder with hyperactivity)     Past Surgical History  Procedure Laterality Date  . Knee arthroscopy  15+yrs ago;2012    x 2 left  . Thyroidectomy, partial  20+yrs ago  . Tonsillectomy      as a child  . Back surgery  2009  . Cardiac catheterization  2008    moderate AS. Normal coronary arteries except for mild plaque in LAD  . Colonoscopy    . Hemorrhoid surgery  30+yrs ago  . Aortic valve replacement  08/18/2011    Procedure: AORTIC  VALVE REPLACEMENT (AVR);  Surgeon: Grace Isaac, MD;  Location: [facility];  Service: Open Heart Surgery;  Laterality: N/A;  On pump.    Social History   Social History  . Marital Status: Married    Spouse Name: [name]  . Number of Children: 3  . 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 Smoking status: Former Smoker -- 0.50 packs/day for 35 years    Types: Cigarettes    Start date: 12/29/1956    Quit date: 08/03/2001  . Smokeless tobacco: Former Systems developer    Types: Snuff    Quit date: 08/17/2011     Comment: quit smoking 10+yrs ago;dips snuff  . Alcohol Use: No  . Drug Use: No  . Sexual Activity: Yes   Other Topics Concern  . Not on file   Social History Narrative     Filed Vitals:   10/03/15  1047  BP: 130/76  Pulse: 69  Height: 5\' 10"  (1.778 m)  Weight: 206 lb (93.441 kg)    PHYSICAL EXAM General: NAD HEENT: Normal. Neck: No JVD, no thyromegaly. Lungs: Clear to auscultation bilaterally with normal respiratory effort. CV: Nondisplaced PMI. Regular rate and rhythm, normal S1/S2, no S3/S4, no murmur. 1+ pitting pretibial and periankle edema.   Abdomen: Soft, nontender, no distention.  Neurologic: Alert and oriented x 3.  Psych: Normal affect. Skin: Normal. Musculoskeletal: Normal range of motion, no gross deformities. Extremities: No clubbing or cyanosis.   ECG: Most recent ECG reviewed.      ASSESSMENT AND PLAN: 1. Bioprosthetic aortic valve: Valve replaced for aortic stenosis. Echo in May 2014 demonstrated normal bioprosthetic valve function. Will repeat echocardiogram. Continue ASA 81 mg daily.   2. Essential HTN: Controlled. Monitor given switch from amlodipine to Diovan.  3. Hyperlipidemia: Followed by PCP. Currently on simvastatin 20 mg daily.  4. Paroxysmal atrial fibrillation: Noted only post-surgery. If recurs, will need anticoagulation.   5. Shortness of breath/leg swelling: Possibly due to recent increase of amlodipine. Will d/c and switch to Diovan 160 mg daily. Will check BMET to monitor renal function early next week. Will obtain echocardiogram to assess for interval change in LV function.  Dispo: f/u 2-3 months.  Kate Sable, M.D., F.A.C.C.

## 2015-10-03 NOTE — Patient Instructions (Addendum)
   Stop Amlodipine  Begin Diovan 160mg  daily - new sent to [facility] today. Continue all other medications.   Lab for BMET - due on Monday, 10/07/2015 - order given today.  Your physician has requested that you have an echocardiogram. Echocardiography is a painless test that uses sound waves to create images of your heart. It provides your doctor with information about the size and shape of your heart and how well your heart's chambers and valves are working. This procedure takes approximately one hour. There are no restrictions for this procedure. Office will contact with results via phone or letter.   Follow up in  3-4 months

## 2015-10-16 ENCOUNTER — Other Ambulatory Visit: Payer: Self-pay

## 2015-10-16 ENCOUNTER — Ambulatory Visit (INDEPENDENT_AMBULATORY_CARE_PROVIDER_SITE_OTHER): Payer: Medicare Other

## 2015-10-16 DIAGNOSIS — R600 Localized edema: Secondary | ICD-10-CM

## 2015-10-16 DIAGNOSIS — R0602 Shortness of breath: Secondary | ICD-10-CM | POA: Diagnosis not present

## 2015-10-22 ENCOUNTER — Telehealth: Payer: Self-pay | Admitting: *Deleted

## 2015-10-22 NOTE — Telephone Encounter (Signed)
-----   Message from Bernita Raisin, RN sent at 10/21/2015 11:16 AM EDT ----- [facility] Noss Pt  ----- Message -----    From: Herminio Commons, MD    Sent: 10/21/2015  11:07 AM      To: Bernita Raisin, RN  Normal pumping function with mild to moderate calcium buildup on aortic valve.

## 2015-10-22 NOTE — Telephone Encounter (Signed)
Notes Recorded by Laurine Blazer, LPN on [id number] at 075-GRM PM Patient notified. Copy to pmd.  Follow up scheduled for May.

## 2015-11-14 ENCOUNTER — Encounter: Payer: Self-pay | Admitting: *Deleted

## 2015-12-03 ENCOUNTER — Encounter: Payer: Self-pay | Admitting: Cardiovascular Disease

## 2015-12-03 ENCOUNTER — Ambulatory Visit (INDEPENDENT_AMBULATORY_CARE_PROVIDER_SITE_OTHER): Payer: Medicare Other | Admitting: Cardiovascular Disease

## 2015-12-03 VITALS — BP 120/78 | HR 68 | Ht 70.0 in | Wt 200.0 lb

## 2015-12-03 DIAGNOSIS — E785 Hyperlipidemia, unspecified: Secondary | ICD-10-CM

## 2015-12-03 DIAGNOSIS — I10 Essential (primary) hypertension: Secondary | ICD-10-CM

## 2015-12-03 DIAGNOSIS — Z954 Presence of other heart-valve replacement: Secondary | ICD-10-CM

## 2015-12-03 DIAGNOSIS — Z953 Presence of xenogenic heart valve: Secondary | ICD-10-CM

## 2015-12-03 DIAGNOSIS — I480 Paroxysmal atrial fibrillation: Secondary | ICD-10-CM | POA: Diagnosis not present

## 2015-12-03 DIAGNOSIS — R600 Localized edema: Secondary | ICD-10-CM

## 2015-12-03 NOTE — Patient Instructions (Signed)
Your physician wants you to follow-up in: 1 YEAR WITH DR KONESWARAN You will receive a reminder letter in the mail two months in advance. If you don't receive a letter, please call our office to schedule the follow-up appointment.  Your physician recommends that you continue on your current medications as directed. Please refer to the Current Medication list given to you today.  Thank you for choosing [facility]!!    

## 2015-12-03 NOTE — Progress Notes (Signed)
Patient ID: Joe Shah, male   DOB: 04-Oct-1940, 75 y.o.   MRN: [id number]      SUBJECTIVE: The patient presents for follow-up for a bioprosthetic aortic valve, hypertension, and paroxysmal atrial fibrillation. Echocardiogram 10/16/15 showed normal left ventricular systolic function and regional wall motion, LVEF 60-65%, mild to moderate LVH, grade 1 diastolic dysfunction, mild to moderate aortic stenosis with mean gradient 13 mmHg. AVR was in 08/2011. He did not have significant CAD on pre-op cath.  Since switching amlodipine to Diovan, he has had no problems with leg swelling. He denies chest pain and shortness of breath as well as palpitations.  Review of Systems: As per "subjective", otherwise negative.  No Known Allergies  Current Outpatient Prescriptions  Medication Sig Dispense Refill  . aspirin 81 MG tablet Take 81 mg by mouth daily.    Marland Kitchen HYDROcodone-acetaminophen (NORCO) 10-325 MG tablet Take 1 tablet by mouth every 6 (six) hours as needed.    . meloxicam (MOBIC) 15 MG tablet Take 15 mg by mouth as needed.    . Pectin POWD 1 each by Does not apply route.    . simvastatin (ZOCOR) 20 MG tablet Take 1 tablet by mouth daily.    . tamsulosin (FLOMAX) 0.4 MG CAPS capsule Take 1 capsule by mouth daily.    . valsartan (DIOVAN) 160 MG tablet Take 1 tablet (160 mg total) by mouth daily. 30 tablet 6   No current facility-administered medications for this visit.    Past Medical History  Diagnosis Date  . Aortic valve disorders   . Other and unspecified hyperlipidemia   . Other B-complex deficiencies   . Microscopic hematuria     managed by medical MD Dr.Cynthia Melina Copa in [place]  . Hyperlipidemia     takes Zocor daily  . Unspecified essential hypertension     takes Accupril daily  . Aortic stenosis   . Heart murmur   . Shortness of breath     with exertion   . Head injury     at age 82 from [place]  . Arthritis   . Scoliosis   . Bruises easily   . Urinary frequency   .  ADD (attention deficit disorder with hyperactivity)     Past Surgical History  Procedure Laterality Date  . Knee arthroscopy  15+yrs ago;2012    x 2 left  . Thyroidectomy, partial  20+yrs ago  . Tonsillectomy      as a child  . Back surgery  2009  . Cardiac catheterization  2008    moderate AS. Normal coronary arteries except for mild plaque in LAD  . Colonoscopy    . Hemorrhoid surgery  30+yrs ago  . Aortic valve replacement  08/18/2011    Procedure: AORTIC VALVE REPLACEMENT (AVR);  Surgeon: Grace Isaac, MD;  Location: [facility];  Service: Open Heart Surgery;  Laterality: N/A;  On pump.    Social History   Social History  . Marital Status: Married    Spouse Name: [name]  . Number of Children: 3  . 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 Smoking status: Former Smoker -- 0.50 packs/day for 35 years    Types: Cigarettes    Start date: 12/29/1956    Quit date: 08/03/2001  . Smokeless tobacco: Former Systems developer    Types: Snuff    Quit date: 08/17/2011     Comment:  quit smoking 10+yrs ago;dips snuff  . Alcohol Use: No  . Drug Use: No  . Sexual Activity: Yes   Other Topics Concern  . Not on file   Social History Narrative     Filed Vitals:   12/03/15 1506  BP: 120/78  Pulse: 68  Height: 5\' 10"  (1.778 m)  Weight: 200 lb (90.719 kg)  SpO2: 92%    PHYSICAL EXAM General: NAD HEENT: Normal. Neck: No JVD, no thyromegaly. Lungs: Clear to auscultation bilaterally with normal respiratory effort. CV: Nondisplaced PMI.  Regular rate and rhythm, normal S1/S2, no S3/S4, no murmur. No pretibial or periankle edema.  No carotid bruit.   Abdomen: Soft, nontender, no distention.  Neurologic: Alert and oriented.  Psych: Normal affect. Skin: Normal. Musculoskeletal: No gross deformities.  ECG: Most recent ECG reviewed.      ASSESSMENT AND PLAN: 1. Bioprosthetic aortic valve: Valve replaced for  aortic stenosis. Echo in March 2017 reviewed above. Continue ASA 81 mg daily.   2. Essential HTN: Controlled. No changes.  3. Hyperlipidemia: Followed by PCP. Currently on simvastatin 20 mg daily.  4. Paroxysmal atrial fibrillation: Noted only post-surgery. If recurs, will need anticoagulation.   5. Shortness of breath/leg swelling: Resolved with switch from amlodipine to Diovan.  Dispo: f/u 1 year.   Kate Sable, M.D., F.A.C.C.

## 2016-01-01 ENCOUNTER — Other Ambulatory Visit: Payer: Self-pay | Admitting: Cardiovascular Disease

## 2016-12-10 ENCOUNTER — Encounter: Payer: Self-pay | Admitting: *Deleted

## 2016-12-11 ENCOUNTER — Ambulatory Visit (INDEPENDENT_AMBULATORY_CARE_PROVIDER_SITE_OTHER): Payer: Medicare Other | Admitting: Cardiovascular Disease

## 2016-12-11 ENCOUNTER — Encounter: Payer: Self-pay | Admitting: Cardiovascular Disease

## 2016-12-11 VITALS — BP 128/82 | HR 84 | Ht 70.0 in | Wt 194.0 lb

## 2016-12-11 DIAGNOSIS — I480 Paroxysmal atrial fibrillation: Secondary | ICD-10-CM | POA: Diagnosis not present

## 2016-12-11 DIAGNOSIS — Z953 Presence of xenogenic heart valve: Secondary | ICD-10-CM | POA: Diagnosis not present

## 2016-12-11 DIAGNOSIS — I10 Essential (primary) hypertension: Secondary | ICD-10-CM | POA: Diagnosis not present

## 2016-12-11 DIAGNOSIS — E782 Mixed hyperlipidemia: Secondary | ICD-10-CM

## 2016-12-11 NOTE — Patient Instructions (Signed)
Your physician wants you to follow-up in: 1 YEAR WITH DR KONESWARAN You will receive a reminder letter in the mail two months in advance. If you don't receive a letter, please call our office to schedule the follow-up appointment.  Your physician recommends that you continue on your current medications as directed. Please refer to the Current Medication list given to you today.  Thank you for choosing [facility]!!    

## 2016-12-11 NOTE — Progress Notes (Signed)
SUBJECTIVE: The patient presents for follow-up for a bioprosthetic aortic valve, hypertension, and paroxysmal atrial fibrillation. Echocardiogram 10/16/15 showed normal left ventricular systolic function and regional wall motion, LVEF 60-65%, mild to moderate LVH, grade 1 diastolic dysfunction, mild to moderate aortic stenosis with mean gradient 13 mmHg. AVR was in 08/2011. He did not have significant CAD on pre-op cath.  ECG performed in the office today which I ordered and personally interpreted demonstrates normal sinus rhythm with no ischemic ST segment or T-wave abnormalities, nor any arrhythmias.  Overall with respect to his heart he is feeling well. He occasionally has some left-sided chest tightness with exertion but this is not consistent. He denies exertional dyspnea, leg swelling, palpitations, orthopnea, paroxysmal nocturnal dyspnea, and syncope. He seldom has lightheadedness and dizziness.  His primary complaints relate to diffuse arthritis and diminished hearing. He has bilateral hearing aids.    Review of Systems: As per "subjective", otherwise negative.  No Known Allergies  Current Outpatient Prescriptions  Medication Sig Dispense Refill  . Amphetamine Sulfate 5 MG TABS Take 2.5 mg by mouth as needed.    Marland Kitchen aspirin 81 MG tablet Take 81 mg by mouth daily.    Marland Kitchen HYDROcodone-acetaminophen (NORCO) 10-325 MG tablet Take 1 tablet by mouth every 6 (six) hours as needed.    . meloxicam (MOBIC) 15 MG tablet Take 15 mg by mouth as needed.    . Pectin POWD 1 each by Does not apply route.    . simvastatin (ZOCOR) 20 MG tablet Take 1 tablet by mouth daily.    . tamsulosin (FLOMAX) 0.4 MG CAPS capsule Take 1 capsule by mouth daily.    . valsartan (DIOVAN) 160 MG tablet TAKE ONE (1) TABLET EACH DAY 90 tablet 3   No current facility-administered medications for this visit.     Past Medical History:  Diagnosis Date  . ADD (attention deficit disorder with hyperactivity)   .  Aortic stenosis   . Aortic valve disorders   . Arthritis   . Bruises easily   . Head injury    at age 34 from [place]  . Heart murmur   . Hyperlipidemia    takes Zocor daily  . Microscopic hematuria    managed by medical MD Dr.Cynthia Melina Copa in [place]  . Other and unspecified hyperlipidemia   . Other B-complex deficiencies   . Scoliosis   . Shortness of breath    with exertion   . Unspecified essential hypertension    takes Accupril daily  . Urinary frequency     Past Surgical History:  Procedure Laterality Date  . AORTIC VALVE REPLACEMENT  08/18/2011   Procedure: AORTIC VALVE REPLACEMENT (AVR);  Surgeon: Grace Isaac, MD;  Location: [facility];  Service: Open Heart Surgery;  Laterality: N/A;  On pump.  Marland Kitchen BACK SURGERY  2009  . CARDIAC CATHETERIZATION  2008   moderate AS. Normal coronary arteries except for mild plaque in LAD  . COLONOSCOPY    . HEMORRHOID SURGERY  30+yrs ago  . KNEE ARTHROSCOPY  15+yrs ago;2012   x 2 left  . THYROIDECTOMY, PARTIAL  20+yrs ago  . TONSILLECTOMY     as a child    Social History   Social History  . Marital status: Married    Spouse name: [name]  . Number of children: 3  . 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 Smoking status: Former Smoker    Packs/day: 0.50    Years: 35.00    Types: Cigarettes    Start date: 12/29/1956    Quit date: 08/03/2001  . Smokeless tobacco: Former Systems developer    Types: Snuff    Quit date: 08/17/2011     Comment: quit smoking 10+yrs ago;dips snuff  . Alcohol use No  . Drug use: No  . Sexual activity: Yes   Other Topics Concern  . Not on file   Social History Narrative  . No narrative on file     Vitals:   12/11/16 0855  BP: 128/82  Pulse: 84  SpO2: 94%  Weight: 194 lb (88 kg)  Height: 5\' 10"  (1.778 m)    Wt Readings from Last 3 Encounters:  12/11/16 194 lb (88 kg)  12/03/15 200 lb (90.7 kg)  10/03/15 206 lb (93.4 kg)      PHYSICAL EXAM General: NAD HEENT: Normal. Neck: No JVD, no thyromegaly. Lungs: Clear to auscultation bilaterally with normal respiratory effort. CV: Nondisplaced PMI.  Regular rate and rhythm, normal S1/prosthetic S2, no S3/S4, no murmur. No pretibial or periankle edema.  No carotid bruit.   Abdomen: Soft, nontender, no distention.  Neurologic: Alert and oriented.  Psych: Normal affect. Skin: Normal. Musculoskeletal: No gross deformities.    ECG: Most recent ECG reviewed.   Labs: Lab Results  Component Value Date/Time   K 4.6 06/21/2013 10:38 AM   BUN 20 06/21/2013 10:38 AM   CREATININE 1.06 06/21/2013 10:38 AM   ALT 17 06/21/2013 10:38 AM   HGB 9.7 (L) 08/21/2011 05:00 AM     Lipids: Lab Results  Component Value Date/Time   LDLCALC 116 (H) 06/21/2013 10:38 AM   CHOL 195 06/21/2013 10:38 AM   TRIG 113 06/21/2013 10:38 AM   HDL 56 06/21/2013 10:38 AM       ASSESSMENT AND PLAN:  1. Bioprosthetic aortic valve: Symptomatically stable. Echocardiogram performed in March 2017 reviewed above. Continue aspirin. Valve replaced for aortic stenosis. Echo in March 2017 reviewed above. Continue ASA 81 mg daily.   2. Essential HTN: Controlled on valsartan 160 mg. No changes.  3. Hyperlipidemia: Continue simvastatin 20 mg daily. Followed by PCP. Currently on simvastatin 20 mg daily.  4. Paroxysmal atrial fibrillation: Denies palpitations. This was noted in the postoperative period. If it recurs, he will require anticoagulation.   Disposition: Follow up 1 yr  Kate Sable, M.D., F.A.C.C.

## 2016-12-22 ENCOUNTER — Other Ambulatory Visit: Payer: Self-pay | Admitting: Cardiovascular Disease

## 2017-03-17 ENCOUNTER — Telehealth: Payer: Self-pay | Admitting: *Deleted

## 2017-03-17 MED ORDER — LOSARTAN POTASSIUM 50 MG PO TABS: 50.0000 mg | ORAL_TABLET | Freq: Every day | ORAL | 3 refills | Status: AC

## 2017-03-17 NOTE — Telephone Encounter (Signed)
Losartan 50 mg 

## 2017-03-17 NOTE — Telephone Encounter (Signed)
Noted.  New medication sent to pharm now.

## 2017-03-17 NOTE — Telephone Encounter (Signed)
Received fax from [facility] - requesting change from Valsartan due to recall.  Please advise with new medication.

## 2017-03-22 ENCOUNTER — Other Ambulatory Visit: Payer: Self-pay | Admitting: Cardiovascular Disease

## 2017-09-02 ENCOUNTER — Encounter (HOSPITAL_COMMUNITY): Payer: Self-pay

## 2017-09-02 ENCOUNTER — Emergency Department (HOSPITAL_COMMUNITY): Payer: Medicare Other

## 2017-09-02 ENCOUNTER — Emergency Department (HOSPITAL_COMMUNITY)
Admission: EM | Admit: 2017-09-02 | Discharge: 2017-09-02 | Disposition: A | Discharge status: Home / Self Care | Payer: Medicare Other | Source: Home / Self Care | Attending: Emergency Medicine | Admitting: Emergency Medicine

## 2017-09-02 DIAGNOSIS — Y999 Unspecified external cause status: Secondary | ICD-10-CM | POA: Insufficient documentation

## 2017-09-02 DIAGNOSIS — I2510 Atherosclerotic heart disease of native coronary artery without angina pectoris: Secondary | ICD-10-CM

## 2017-09-02 DIAGNOSIS — Z87891 Personal history of nicotine dependence: Secondary | ICD-10-CM

## 2017-09-02 DIAGNOSIS — I10 Essential (primary) hypertension: Secondary | ICD-10-CM | POA: Insufficient documentation

## 2017-09-02 DIAGNOSIS — Y939 Activity, unspecified: Secondary | ICD-10-CM

## 2017-09-02 DIAGNOSIS — Z79899 Other long term (current) drug therapy: Secondary | ICD-10-CM | POA: Insufficient documentation

## 2017-09-02 DIAGNOSIS — M5440 Lumbago with sciatica, unspecified side: Secondary | ICD-10-CM | POA: Insufficient documentation

## 2017-09-02 DIAGNOSIS — W1839XA Other fall on same level, initial encounter: Secondary | ICD-10-CM | POA: Insufficient documentation

## 2017-09-02 DIAGNOSIS — R531 Weakness: Secondary | ICD-10-CM | POA: Insufficient documentation

## 2017-09-02 DIAGNOSIS — Z8551 Personal history of malignant neoplasm of bladder: Secondary | ICD-10-CM | POA: Insufficient documentation

## 2017-09-02 DIAGNOSIS — Y929 Unspecified place or not applicable: Secondary | ICD-10-CM | POA: Insufficient documentation

## 2017-09-02 DIAGNOSIS — W19XXXA Unspecified fall, initial encounter: Secondary | ICD-10-CM

## 2017-09-02 DIAGNOSIS — M48061 Spinal stenosis, lumbar region without neurogenic claudication: Secondary | ICD-10-CM | POA: Diagnosis not present

## 2017-09-02 DIAGNOSIS — Z7982 Long term (current) use of aspirin: Secondary | ICD-10-CM

## 2017-09-02 HISTORY — DX: Malignant (primary) neoplasm, unspecified: C80.1

## 2017-09-02 HISTORY — DX: Unspecified hearing loss, unspecified ear: H91.90

## 2017-09-02 LAB — CBC WITH DIFFERENTIAL/PLATELET
BASOS ABS: 0 10*3/uL (ref 0.0–0.1)
BASOS PCT: 0 %
EOS PCT: 0 %
Eosinophils Absolute: 0 10*3/uL (ref 0.0–0.7)
HCT: 37.6 % — ABNORMAL LOW (ref 39.0–52.0)
Hemoglobin: 12.2 g/dL — ABNORMAL LOW (ref 13.0–17.0)
Lymphocytes Relative: 21 %
Lymphs Abs: 1.9 10*3/uL (ref 0.7–4.0)
MCH: 26.6 pg (ref 26.0–34.0)
MCHC: 32.4 g/dL (ref 30.0–36.0)
MCV: 81.9 fL (ref 78.0–100.0)
MONO ABS: 0.8 10*3/uL (ref 0.1–1.0)
Monocytes Relative: 9 %
Neutro Abs: 6.2 10*3/uL (ref 1.7–7.7)
Neutrophils Relative %: 70 %
PLATELETS: 157 10*3/uL (ref 150–400)
RBC: 4.59 MIL/uL (ref 4.22–5.81)
RDW: 14 % (ref 11.5–15.5)
WBC: 8.9 10*3/uL (ref 4.0–10.5)

## 2017-09-02 LAB — BASIC METABOLIC PANEL
Anion gap: 10 (ref 5–15)
BUN: 29 mg/dL — AB (ref 6–20)
CALCIUM: 9 mg/dL (ref 8.9–10.3)
CO2: 24 mmol/L (ref 22–32)
CREATININE: 1.22 mg/dL (ref 0.61–1.24)
Chloride: 100 mmol/L — ABNORMAL LOW (ref 101–111)
GFR calc Af Amer: >60 mL/min (ref >60)
GFR, EST NON AFRICAN AMERICAN: 56 mL/min — AB (ref >60)
GLUCOSE: 126 mg/dL — AB (ref 65–99)
Potassium: 4.6 mmol/L (ref 3.5–5.1)
Sodium: 134 mmol/L — ABNORMAL LOW (ref 135–145)

## 2017-09-02 MED ORDER — HYDROCODONE-ACETAMINOPHEN 5-325 MG PO TABS: 1.0000 | ORAL_TABLET | Freq: Four times a day (QID) | ORAL | 0 refills | Status: DC | PRN

## 2017-09-02 MED ORDER — MORPHINE SULFATE (PF) 4 MG/ML IV SOLN
6.0000 mg | Freq: Once | INTRAVENOUS | Status: AC
  Administered 2017-09-02: 6 mg via INTRAVENOUS
  Filled 2017-09-02: qty 2

## 2017-09-02 MED ORDER — ACETAMINOPHEN 325 MG PO TABS
650.0000 mg | ORAL_TABLET | Freq: Once | ORAL | Status: AC
  Administered 2017-09-02: 650 mg via ORAL
  Filled 2017-09-02: qty 2

## 2017-09-02 MED ORDER — LACTATED RINGERS IV BOLUS (SEPSIS)
1000.0000 mL | Freq: Once | INTRAVENOUS | Status: AC
  Administered 2017-09-02: 1000 mL via INTRAVENOUS

## 2017-09-02 MED ORDER — ACETAMINOPHEN 325 MG PO TABS: 650.0000 mg | ORAL_TABLET | Freq: Four times a day (QID) | ORAL | 0 refills | Status: AC | PRN

## 2017-09-02 MED ORDER — KETOROLAC TROMETHAMINE 30 MG/ML IJ SOLN
15.0000 mg | Freq: Once | INTRAMUSCULAR | Status: AC
  Administered 2017-09-02: 15 mg via INTRAVENOUS
  Filled 2017-09-02: qty 1

## 2017-09-02 MED ORDER — NAPROXEN 375 MG PO TABS: 375.0000 mg | ORAL_TABLET | Freq: Two times a day (BID) | ORAL | 0 refills | Status: AC

## 2017-09-02 MED ORDER — HYDROMORPHONE HCL 1 MG/ML IJ SOLN
1.0000 mg | Freq: Once | INTRAMUSCULAR | Status: AC
  Administered 2017-09-02: 1 mg via INTRAVENOUS
  Filled 2017-09-02: qty 1

## 2017-09-02 NOTE — ED Provider Notes (Signed)
[facility] EMERGENCY DEPARTMENT Provider Note   CSN: [id number] Arrival date & time: 09/02/17  1722     History   Chief Complaint Chief Complaint  Patient presents with  . Fall  . Weakness    HPI Joe Shah is a 77 y.o. male.  HPI 77 year old male with history of aortic stenosis remote history of bladder cancer, chronic back pain comes in with chief complaint of fall.  Around noon patient had a mechanical fall.  Patient was able to get up and lay down in his bed, however when he woke up his pain was severe.   Pt has no new associated numbness, weakness, urinary incontinence, urinary retention, bowel incontinence, pins and needle sensation in the perineal area.  Patient states that in general his chronic back pain is well controlled, however over the last 3 weeks he has had 2 episodes where he had his legs give out.   Past Medical History:  Diagnosis Date  . ADD (attention deficit disorder with hyperactivity)   . Aortic stenosis   . Aortic valve disorders   . Arthritis   . Bruises easily   . Cancer [facility])    bladder cancer  . Head injury    at age 66 from [place]  . Heart murmur   . HOH (hard of hearing)   . Hyperlipidemia    takes Zocor daily  . Microscopic hematuria    managed by medical MD Dr.Cynthia Melina Copa in [place]  . Other and unspecified hyperlipidemia   . Other B-complex deficiencies   . Scoliosis   . Shortness of breath    with exertion   . Unspecified essential hypertension    takes Accupril daily  . Urinary frequency     Patient Active Problem List   Diagnosis Date Noted  . CAD (coronary artery disease) 01/11/2012  . Paroxysmal atrial fibrillation ([place]) 09/11/2011  . Aortic valve Replacement 08/18/2011  . Other B-complex deficiencies   . Carotid bruit 07/24/2011  . Essential hypertension   . Aortic valve disorders     Past Surgical History:  Procedure Laterality Date  . AORTIC VALVE REPLACEMENT  08/18/2011   Procedure: AORTIC VALVE  REPLACEMENT (AVR);  Surgeon: Grace Isaac, MD;  Location: [place];  Service: Open Heart Surgery;  Laterality: N/A;  On pump.  Marland Kitchen BACK SURGERY  2009  . CARDIAC CATHETERIZATION  2008   moderate AS. Normal coronary arteries except for mild plaque in LAD  . COLONOSCOPY    . HEMORRHOID SURGERY  30+yrs ago  . KNEE ARTHROSCOPY  15+yrs ago;2012   x 2 left  . THYROIDECTOMY, PARTIAL  20+yrs ago  . TONSILLECTOMY     as a child       Home Medications    Prior to Admission medications   Medication Sig Start Date End Date Taking? Authorizing Provider  amphetamine-dextroamphetamine (ADDERALL) 10 MG tablet Take 10 mg by mouth daily as needed.  07/29/17  Yes [provider]  aspirin 81 MG tablet Take 81 mg by mouth daily.   Yes [provider]  gabapentin (NEURONTIN) 300 MG capsule Take 300 mg by mouth daily as needed (for pain).  06/25/17  Yes [provider]  meloxicam (MOBIC) 15 MG tablet Take 15 mg by mouth as needed. 07/25/13  Yes Chipper Herb, MD  simvastatin (ZOCOR) 20 MG tablet TAKE 1 TABLET DAILY IN THE EVENING 03/22/17  Yes Herminio Commons, MD  tamsulosin (FLOMAX) 0.4 MG CAPS capsule Take 1 capsule by  mouth daily. 09/24/15  Yes [provider]  acetaminophen (TYLENOL) 325 MG tablet Take 2 tablets (650 mg total) by mouth every 6 (six) hours as needed. 09/02/17   Varney Biles, MD  HYDROcodone-acetaminophen (NORCO/VICODIN) 5-325 MG tablet Take 1 tablet by mouth every 6 (six) hours as needed. 09/02/17   Varney Biles, MD  losartan (COZAAR) 50 MG tablet Take 1 tablet (50 mg total) by mouth daily. 03/17/17 06/15/17  Herminio Commons, MD  naproxen (NAPROSYN) 375 MG tablet Take 1 tablet (375 mg total) by mouth 2 (two) times daily. 09/02/17   Varney Biles, MD    Family History Family History  Problem Relation Age of Onset  . Heart failure Mother        died 53  . Brain cancer Father        died 48  . Anesthesia problems Neg Hx   .  Hypotension Neg Hx   . Malignant hyperthermia Neg Hx   . Pseudochol deficiency Neg Hx     Social History Social History   Tobacco Use  . Smoking status: Former Smoker    Packs/day: 0.50    Years: 35.00    Pack years: 17.50    Types: Cigarettes    Start date: 12/29/1956    Last attempt to quit: 08/03/2001    Years since quitting: 16.0  . Smokeless tobacco: Former Systems developer    Types: Snuff    Quit date: 08/17/2011  . Tobacco comment: quit smoking 10+yrs ago;dips snuff  Substance Use Topics  . Alcohol use: Yes    Alcohol/week: 0.6 oz    Types: 1 Glasses of wine per week    Comment: one glass per night  . Drug use: No     Allergies   Patient has no known allergies.   Review of Systems Review of Systems  Constitutional: Positive for activity change.  Musculoskeletal: Positive for back pain.  All other systems reviewed and are negative.    Physical Exam Updated Vital Signs BP 125/88 (BP Location: Right Arm)   Pulse 99   Temp 99.3 F (37.4 C) (Oral)   Resp 19   Wt 81.6 kg (180 lb)   SpO2 90%   BMI 25.83 kg/m   Physical Exam  Constitutional: He is oriented to person, place, and time. He appears well-developed.  HENT:  Head: Atraumatic.  Neck: Neck supple.  Cardiovascular: Normal rate.  Pulmonary/Chest: Effort normal.  Musculoskeletal:  Head to toe evaluation shows no hematoma, bleeding of the scalp, no facial abrasions, no spine step offs, crepitus of the chest or neck, no tenderness to palpation of the bilateral upper and lower extremities, no gross deformities, no chest tenderness, no pelvic pain.  Pt has tenderness over the lumbar region No step offs, no erythema. Pt has trace but equal patellar reflex bilaterally. Able to discriminate between sharp and dull. Negative passive leg raise test.   Neurological: He is alert and oriented to person, place, and time.  Skin: Skin is warm.  Nursing note and vitals reviewed.    ED Treatments / Results  Labs (all  labs ordered are listed, but only abnormal results are displayed) Labs Reviewed  BASIC METABOLIC PANEL - Abnormal; Notable for the following components:      Result Value   Sodium 134 (*)    Chloride 100 (*)    Glucose, Bld 126 (*)    BUN 29 (*)    GFR calc non Af Amer 56 (*)    All other components  within normal limits  CBC WITH DIFFERENTIAL/PLATELET - Abnormal; Notable for the following components:   Hemoglobin 12.2 (*)    HCT 37.6 (*)    All other components within normal limits  URINALYSIS, ROUTINE W REFLEX MICROSCOPIC    EKG  EKG Interpretation None       Radiology Dg Lumbar Spine Complete  Result Date: 09/02/2017 CLINICAL DATA:  Fall this morning with bilateral leg weakness and low back pain. EXAM: LUMBAR SPINE - COMPLETE 4+ VIEW COMPARISON:  KUB 01/28/2016 and lumbar spine 11/29/2009 FINDINGS: Moderate curvature of the lower thoracic/lumbar spine convex right without significant change. Moderate spondylosis throughout the lumbar spine. Moderate disc disease at all levels of the lumbar spine without significant change. Grade 2 anterolisthesis of L3 on L4 with interval worsening. This is likely due to the severe facet arthropathy over the mid to lower lumbar spine. No definite compression fracture. Moderate calcified plaque over the abdominal aorta. IMPRESSION: Moderate spondylosis of the lumbar spine with moderate multilevel disc disease throughout the lumbar spine. Interval progression of grade 2 anterolisthesis of L3 on L4 likely due to severe facet arthropathy. Electronically Signed   By: Marin Olp M.D.   On: 09/02/2017 21:06    Procedures Procedures (including critical care time)  Medications Ordered in ED Medications  lactated ringers bolus 1,000 mL (0 mLs Intravenous Stopped 09/02/17 2131)  morphine 4 MG/ML injection 6 mg (6 mg Intravenous Given 09/02/17 2022)  HYDROmorphone (DILAUDID) injection 1 mg (1 mg Intravenous Given 09/02/17 2133)  ketorolac (TORADOL) 30  MG/ML injection 15 mg (15 mg Intravenous Given 09/02/17 2208)  acetaminophen (TYLENOL) tablet 650 mg (650 mg Oral Given 09/02/17 2218)     Initial Impression / Assessment and Plan / ED Course  I have reviewed the triage vital signs and the nursing notes.  Pertinent labs & imaging results that were available during my care of the patient were reviewed by me and considered in my medical decision making (see chart for details).  Clinical Course as of Sep 02 2232  Thu Sep 02, 2017  2216 Results from the ER workup discussed with the patient face to face and all questions answered to the best of my ability.  Patient and his son advised to take pain medicine as prescribed.  If the pain is unbearable in the will return to the ER for optimal pain control DG Lumbar Spine Complete [AN]    Clinical Course User Index [AN] Varney Biles, MD    DDx includes: - Mechanical falls - ICH - Fractures - Contusions - Soft tissue injury  Patient comes in with chief complaint of fall.  Patient's fall was 8 hours ago, and he is not having any red flags concerning for intracranial hemorrhage. Patient's main pain is in the lower back and there is some radicular component to the pain.  Patient has history of chronic back pain, but after the fall his pain is much worse.  We will get appropriate imaging and reassess.  Final Clinical Impressions(s) / ED Diagnoses   Final diagnoses:  Acute midline low back pain with sciatica, sciatica laterality unspecified  Fall, initial encounter    ED Discharge Orders        Ordered    HYDROcodone-acetaminophen (NORCO/VICODIN) 5-325 MG tablet  Every 6 hours PRN     09/02/17 2206    naproxen (NAPROSYN) 375 MG tablet  2 times daily     09/02/17 2206    acetaminophen (TYLENOL) 325 MG tablet  Every 6 hours  PRN     09/02/17 2211       Varney Biles, MD 09/02/17 2235

## 2017-09-02 NOTE — ED Triage Notes (Addendum)
Pt brought in by EMS due to fall this morning and complaints of bilateral leg weakness approx 3 days. Pt reports back pain and asking for pain med enroute. Pt reports recently completing prednisone pack for left shoulder. Denies N/V/D. Pt reports that he went to his PCP yesterday and was checked for flu which was negative.

## 2017-09-02 NOTE — Discharge Instructions (Signed)
Please take the medicines for pain control. Tried ice and heat the area of pain. Return to the ER immediately if your symptoms get worse.

## 2017-09-04 ENCOUNTER — Emergency Department (HOSPITAL_COMMUNITY): Payer: Medicare Other

## 2017-09-04 ENCOUNTER — Encounter (HOSPITAL_COMMUNITY): Payer: Self-pay

## 2017-09-04 ENCOUNTER — Inpatient Hospital Stay (HOSPITAL_COMMUNITY)
Admission: EM | Admit: 2017-09-04 | Discharge: 2017-09-08 | DRG: 552 | Disposition: A | Discharge status: Home Health | Payer: Medicare Other | Attending: Neurosurgery | Admitting: Neurosurgery

## 2017-09-04 ENCOUNTER — Other Ambulatory Visit: Payer: Self-pay

## 2017-09-04 DIAGNOSIS — Z87891 Personal history of nicotine dependence: Secondary | ICD-10-CM

## 2017-09-04 DIAGNOSIS — I4891 Unspecified atrial fibrillation: Secondary | ICD-10-CM | POA: Diagnosis present

## 2017-09-04 DIAGNOSIS — M479 Spondylosis, unspecified: Secondary | ICD-10-CM | POA: Diagnosis present

## 2017-09-04 DIAGNOSIS — M48061 Spinal stenosis, lumbar region without neurogenic claudication: Secondary | ICD-10-CM | POA: Diagnosis present

## 2017-09-04 DIAGNOSIS — Z79899 Other long term (current) drug therapy: Secondary | ICD-10-CM | POA: Diagnosis not present

## 2017-09-04 DIAGNOSIS — M4807 Spinal stenosis, lumbosacral region: Secondary | ICD-10-CM | POA: Diagnosis present

## 2017-09-04 DIAGNOSIS — G834 Cauda equina syndrome: Secondary | ICD-10-CM | POA: Diagnosis present

## 2017-09-04 DIAGNOSIS — N320 Bladder-neck obstruction: Secondary | ICD-10-CM | POA: Diagnosis present

## 2017-09-04 DIAGNOSIS — M545 Low back pain, unspecified: Secondary | ICD-10-CM

## 2017-09-04 DIAGNOSIS — M419 Scoliosis, unspecified: Secondary | ICD-10-CM | POA: Diagnosis present

## 2017-09-04 DIAGNOSIS — Z952 Presence of prosthetic heart valve: Secondary | ICD-10-CM

## 2017-09-04 DIAGNOSIS — Z8551 Personal history of malignant neoplasm of bladder: Secondary | ICD-10-CM

## 2017-09-04 DIAGNOSIS — I10 Essential (primary) hypertension: Secondary | ICD-10-CM | POA: Diagnosis present

## 2017-09-04 DIAGNOSIS — H9190 Unspecified hearing loss, unspecified ear: Secondary | ICD-10-CM | POA: Diagnosis present

## 2017-09-04 DIAGNOSIS — F909 Attention-deficit hyperactivity disorder, unspecified type: Secondary | ICD-10-CM | POA: Diagnosis present

## 2017-09-04 DIAGNOSIS — M5136 Other intervertebral disc degeneration, lumbar region: Secondary | ICD-10-CM | POA: Diagnosis present

## 2017-09-04 DIAGNOSIS — E785 Hyperlipidemia, unspecified: Secondary | ICD-10-CM | POA: Diagnosis present

## 2017-09-04 LAB — CBC WITH DIFFERENTIAL/PLATELET
Basophils Absolute: 0 10*3/uL (ref 0.0–0.1)
Basophils Relative: 0 %
Eosinophils Absolute: 0.1 10*3/uL (ref 0.0–0.7)
Eosinophils Relative: 1 %
HEMATOCRIT: 37.2 % — AB (ref 39.0–52.0)
HEMOGLOBIN: 12.6 g/dL — AB (ref 13.0–17.0)
LYMPHS PCT: 10 %
Lymphs Abs: 1.2 10*3/uL (ref 0.7–4.0)
MCH: 27.4 pg (ref 26.0–34.0)
MCHC: 33.9 g/dL (ref 30.0–36.0)
MCV: 80.9 fL (ref 78.0–100.0)
MONO ABS: 0.8 10*3/uL (ref 0.1–1.0)
MONOS PCT: 7 %
NEUTROS ABS: 9.5 10*3/uL — AB (ref 1.7–7.7)
Neutrophils Relative %: 82 %
Platelets: 210 10*3/uL (ref 150–400)
RBC: 4.6 MIL/uL (ref 4.22–5.81)
RDW: 14.1 % (ref 11.5–15.5)
WBC: 11.5 10*3/uL — ABNORMAL HIGH (ref 4.0–10.5)

## 2017-09-04 LAB — URINALYSIS, ROUTINE W REFLEX MICROSCOPIC
BILIRUBIN URINE: NEGATIVE
Glucose, UA: NEGATIVE mg/dL
Ketones, ur: 5 mg/dL — AB
LEUKOCYTES UA: NEGATIVE
Nitrite: NEGATIVE
Protein, ur: NEGATIVE mg/dL
SPECIFIC GRAVITY, URINE: 1.018 (ref 1.005–1.030)
pH: 5 (ref 5.0–8.0)

## 2017-09-04 LAB — BASIC METABOLIC PANEL
ANION GAP: 10 (ref 5–15)
BUN: 34 mg/dL — ABNORMAL HIGH (ref 6–20)
CALCIUM: 9.3 mg/dL (ref 8.9–10.3)
CHLORIDE: 102 mmol/L (ref 101–111)
CO2: 25 mmol/L (ref 22–32)
CREATININE: 1.19 mg/dL (ref 0.61–1.24)
GFR calc Af Amer: >60 mL/min (ref >60)
GFR calc non Af Amer: 58 mL/min — ABNORMAL LOW (ref >60)
GLUCOSE: 122 mg/dL — AB (ref 65–99)
Potassium: 4.4 mmol/L (ref 3.5–5.1)
Sodium: 137 mmol/L (ref 135–145)

## 2017-09-04 MED ORDER — HYDROMORPHONE HCL 1 MG/ML IJ SOLN
1.0000 mg | Freq: Once | INTRAMUSCULAR | Status: AC
Start: 2017-09-04 — End: 2017-09-04
  Administered 2017-09-04: 1 mg via INTRAVENOUS
  Filled 2017-09-04: qty 1

## 2017-09-04 MED ORDER — METHOCARBAMOL 1000 MG/10ML IJ SOLN
500.0000 mg | Freq: Once | INTRAMUSCULAR | Status: AC
  Administered 2017-09-04: 500 mg via INTRAMUSCULAR
  Filled 2017-09-04: qty 5

## 2017-09-04 MED ORDER — MORPHINE SULFATE (PF) 4 MG/ML IV SOLN
4.0000 mg | INTRAVENOUS | Status: DC | PRN
  Administered 2017-09-04: 4 mg via INTRAVENOUS
  Filled 2017-09-04: qty 1

## 2017-09-04 MED ORDER — HYDROMORPHONE HCL 1 MG/ML IJ SOLN
0.5000 mg | Freq: Once | INTRAMUSCULAR | Status: AC
  Administered 2017-09-04: 0.5 mg via INTRAVENOUS
  Filled 2017-09-04: qty 1

## 2017-09-04 MED ORDER — DEXAMETHASONE SODIUM PHOSPHATE 10 MG/ML IJ SOLN
10.0000 mg | Freq: Once | INTRAMUSCULAR | Status: AC
  Administered 2017-09-04: 10 mg via INTRAVENOUS
  Filled 2017-09-04: qty 1

## 2017-09-04 MED ORDER — SODIUM CHLORIDE 0.9 % IV SOLN
INTRAVENOUS | Status: AC
  Administered 2017-09-04: 21:00:00 via INTRAVENOUS

## 2017-09-04 MED ORDER — ONDANSETRON HCL 4 MG/2ML IJ SOLN: 4.0000 mg | Freq: Three times a day (TID) | INTRAMUSCULAR | Status: DC | PRN

## 2017-09-04 NOTE — ED Provider Notes (Signed)
[facility] Provider Note   CSN: [id number] Arrival date & time: 09/04/17  1355     History   Chief Complaint Chief Complaint  Patient presents with  . Back Pain    HPI Joe Shah is a 77 y.o. male.  HPI   76 year old male with past medical history as below including hypertension, A. fib, chronic back pain, here with acute on chronic back pain.  The patient reportedly has been fighting a "cold" for the last week.  He has had cough, nasal congestion, and generalized weakness.  Due to this weakness, he fell on 1/31.  He was evaluated at an outside hospital at that time and had negative plain films and was sent home.  Since then, he has had persistent, severe, lower back pain.  The pain is limiting his ability to get out of bed and is been unable to get out of his bed for the last 24 hours.  He said associated severe spasms in his lower back.  The pain radiates down bilateral legs.  He has chronic numbness and weakness in his right leg due to old back injuries, but states this is worsened.  Is also had some mild constipation.  He has had some occasional difficulty urinating.  No fevers or chills.  States the urinary as well as constipation issues have been ongoing for some time, and are not significantly new from his fall.  No other injuries.  No headaches.  No upper neck or upper back pain.  The pain is worse with any movement or palpation.  No evident factors.  Past Medical History:  Diagnosis Date  . ADD (attention deficit disorder with hyperactivity)   . Aortic stenosis   . Aortic valve disorders   . Arthritis   . Bruises easily   . Cancer [facility])    bladder cancer  . Head injury    at age 22 from [place]  . Heart murmur   . HOH (hard of hearing)   . Hyperlipidemia    takes Zocor daily  . Microscopic hematuria    managed by medical MD Dr.Cynthia Melina Copa in [place]  . Other and unspecified hyperlipidemia   . Other B-complex deficiencies   . Scoliosis    . Shortness of breath    with exertion   . Unspecified essential hypertension    takes Accupril daily  . Urinary frequency     Patient Active Problem List   Diagnosis Date Noted  . Spinal stenosis, lumbar 09/04/2017  . CAD (coronary artery disease) 01/11/2012  . Paroxysmal atrial fibrillation ([place]) 09/11/2011  . Aortic valve Replacement 08/18/2011  . Other B-complex deficiencies   . Carotid bruit 07/24/2011  . Essential hypertension   . Aortic valve disorders     Past Surgical History:  Procedure Laterality Date  . AORTIC VALVE REPLACEMENT  08/18/2011   Procedure: AORTIC VALVE REPLACEMENT (AVR);  Surgeon: Grace Isaac, MD;  Location: [facility];  Service: Open Heart Surgery;  Laterality: N/A;  On pump.  Marland Kitchen BACK SURGERY  2009  . CARDIAC CATHETERIZATION  2008   moderate AS. Normal coronary arteries except for mild plaque in LAD  . COLONOSCOPY    . HEMORRHOID SURGERY  30+yrs ago  . KNEE ARTHROSCOPY  15+yrs ago;2012   x 2 left  . THYROIDECTOMY, PARTIAL  20+yrs ago  . TONSILLECTOMY     as a child       Home Medications    Prior to Admission medications  Medication Sig Start Date End Date Taking? Authorizing Provider  amphetamine-dextroamphetamine (ADDERALL) 10 MG tablet Take 10 mg by mouth daily as needed.  07/29/17  Yes [provider]  aspirin 81 MG tablet Take 81 mg by mouth daily.   Yes [provider]  gabapentin (NEURONTIN) 300 MG capsule Take 300 mg by mouth daily as needed (for pain).  06/25/17  Yes [provider]  HYDROcodone-acetaminophen (NORCO/VICODIN) 5-325 MG tablet Take 1 tablet by mouth every 6 (six) hours as needed. 09/02/17  Yes Nanavati, Ankit, MD  losartan (COZAAR) 50 MG tablet Take 1 tablet (50 mg total) by mouth daily. 03/17/17 09/04/17 Yes Herminio Commons, MD  meloxicam (MOBIC) 15 MG tablet Take 15 mg by mouth as needed. 07/25/13  Yes Chipper Herb, MD  naproxen (NAPROSYN) 375 MG tablet Take 1 tablet (375 mg total) by  mouth 2 (two) times daily. 09/02/17  Yes Varney Biles, MD  simvastatin (ZOCOR) 20 MG tablet TAKE 1 TABLET DAILY IN THE EVENING 03/22/17  Yes Herminio Commons, MD  sodium chloride (OCEAN) 0.65 % SOLN nasal spray Place 2 sprays into both nostrils as needed for congestion.   Yes [provider]  tamsulosin (FLOMAX) 0.4 MG CAPS capsule Take 1 capsule by mouth daily. 09/24/15  Yes [provider]  acetaminophen (TYLENOL) 325 MG tablet Take 2 tablets (650 mg total) by mouth every 6 (six) hours as needed. Patient not taking: Reported on 09/04/2017 09/02/17   Varney Biles, MD    Family History Family History  Problem Relation Age of Onset  . Heart failure Mother        died 3  . Brain cancer Father        died 55  . Anesthesia problems Neg Hx   . Hypotension Neg Hx   . Malignant hyperthermia Neg Hx   . Pseudochol deficiency Neg Hx     Social History Social History   Tobacco Use  . Smoking status: Former Smoker    Packs/day: 0.50    Years: 35.00    Pack years: 17.50    Types: Cigarettes    Start date: 12/29/1956    Last attempt to quit: 08/03/2001    Years since quitting: 16.0  . Smokeless tobacco: Former Systems developer    Types: Snuff    Quit date: 08/17/2011  . Tobacco comment: quit smoking 10+yrs ago;dips snuff  Substance Use Topics  . Alcohol use: Yes    Alcohol/week: 0.6 oz    Types: 1 Glasses of wine per week    Comment: one glass per night  . Drug use: No     Allergies   Patient has no known allergies.   Review of Systems Review of Systems  Constitutional: Positive for fatigue.  Musculoskeletal: Positive for back pain and gait problem.  Neurological: Positive for weakness and numbness.  All other systems reviewed and are negative.    Physical Exam Updated Vital Signs BP (!) 141/86   Pulse 84   Temp 98.4 F (36.9 C) (Oral)   Resp 17   Ht 5\' 10"  (1.778 m)   Wt 81.6 kg (180 lb)   SpO2 96%   BMI 25.83 kg/m   Physical Exam  Constitutional: He  is oriented to person, place, and time. He appears well-developed and well-nourished. He appears distressed.  HENT:  Head: Normocephalic and atraumatic.  Eyes: Conjunctivae are normal.  Neck: Neck supple.  Cardiovascular: Normal rate, regular rhythm and normal heart sounds. Exam reveals no friction rub.  No murmur heard. Pulmonary/Chest: Effort normal and breath sounds normal. No respiratory distress. He has no wheezes. He has no rales.  Abdominal: He exhibits no distension.  Musculoskeletal: He exhibits no edema.  Neurological: He is alert and oriented to person, place, and time. He exhibits normal muscle tone.  Skin: Skin is warm. Capillary refill takes less than 2 seconds.  Psychiatric: He has a normal mood and affect.  Nursing note and vitals reviewed.   Spine Exam: Inspection/Palpation: Significant tenderness over bilateral lower lumbar paraspinal and midline.  No step-offs or deformity.  Intermittent spasms causing severe pain. Strength: 5/5 throughout LE bilaterally (hip flexion/extension, adduction/abduction; knee flexion/extension; foot dorsiflexion/plantarflexion, inversion/eversion; great toe inversion) with exception of right toe dorsiflexion, which is 4 out of 5. Sensation: Intact to light touch in proximal and distal LE bilaterally, but subjectively diminished along medial aspect of right leg. Intact perianal sensation. Reflexes: 2+ quadriceps and achilles reflexes  ED Treatments / Results  Labs (all labs ordered are listed, but only abnormal results are displayed) Labs Reviewed  CBC WITH DIFFERENTIAL/PLATELET - Abnormal; Notable for the following components:      Result Value   WBC 11.5 (*)    Hemoglobin 12.6 (*)    HCT 37.2 (*)    Neutro Abs 9.5 (*)    All other components within normal limits  BASIC METABOLIC PANEL - Abnormal; Notable for the following components:   Glucose, Bld 122 (*)    BUN 34 (*)    GFR calc non Af Amer 58 (*)    All other components within  normal limits  URINALYSIS, ROUTINE W REFLEX MICROSCOPIC - Abnormal; Notable for the following components:   Hgb urine dipstick MODERATE (*)    Ketones, ur 5 (*)    Bacteria, UA FEW (*)    Squamous Epithelial / LPF 0-5 (*)    All other components within normal limits    EKG  EKG Interpretation  Date/Time:  Saturday September 04 2017 15:22:09 EST Ventricular Rate:  91 PR Interval:    QRS Duration: 94 QT Interval:  347 QTC Calculation: 427 R Axis:   -24 Text Interpretation:  Sinus rhythm Atrial premature complex Borderline left axis deviation Borderline low voltage, extremity leads No significant change since last tracing Confirmed by Duffy Bruce [phone]) on 09/04/2017 4:19:18 PM       Radiology Dg Chest 2 View  Result Date: 09/04/2017 CLINICAL DATA:  Cough, fatigue EXAM: CHEST  2 VIEW COMPARISON:  09/10/2011 FINDINGS: There are low lung volumes. There is no focal parenchymal opacity. There is no pleural effusion or pneumothorax. The heart and mediastinal contours are unremarkable. 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 IMPRESSION: No active cardiopulmonary disease. Electronically Signed   By: Kathreen Devoid   On: 09/04/2017 17:03   Ct Lumbar Spine Wo Contrast  Result Date: 09/04/2017 CLINICAL DATA:  Witnessed fall Friday.  Not ambulatory. EXAM: CT LUMBAR SPINE WITHOUT CONTRAST TECHNIQUE: Multidetector CT imaging of the lumbar spine was performed without intravenous contrast administration. Multiplanar CT image reconstructions were also generated. COMPARISON:  Plain films 09/02/2017. FINDINGS: Segmentation: Standard. Alignment: Degenerative scoliosis convex RIGHT thoracolumbar junction. Mild anterolisthesis L3 on L4, 3 mm. Vertebrae: No acute fracture or focal pathologic process. Paraspinal and other soft tissues: Negative. Aortic atherosclerosis. Marked bladder distention. There appears to be marked prostate enlargement. Disc levels:  L1-L2: Advanced disc space narrowing with osseous spurring. Vacuum phenomenon. LEFT subarticular zone and foraminal zone narrowing. L2-L3: Moderate  disc space narrowing. Osseous spurring. Facet arthropathy. Mild stenosis. No definite subarticular zone narrowing. Significant foraminal narrowing on the LEFT. L3-L4: Moderate disc space narrowing. 3 mm anterolisthesis. Calcified protrusion in the midline. Marked posterior element hypertrophy, primarily facet overgrowth. Severe stenosis. RIGHT greater than LEFT L4 nerve root impingement due to subarticular zone. The RIGHT L3 nerve root may be compressed in the foramen. L4-L5: Severe disc space narrowing with vacuum phenomenon. Osseous spurring. Advanced posterior element hypertrophy. Mild to moderate stenosis. RIGHT sided foraminal zone narrowing could affect the L4 nerve root. L5-S1: Moderate to advanced disc space narrowing. Facet arthropathy. No stenosis or subarticular zone narrowing, but LEFT-sided foraminal narrowing could affect the L5 nerve root. IMPRESSION: Multilevel spondylosis, with varying degrees of stenosis, subarticular zone narrowing and foraminal zone narrowing. 3 mm anterolisthesis L3-4 appears facet mediated. There is no significant slip to suggest ligamentous injury. No visible lower thoracic or lumbar spine compression fracture is evident. Aortic Atherosclerosis (ICD10-I70.0). Marked bladder distention, query bladder outlet obstruction Electronically Signed   By: Staci Righter M.D.   On: 09/04/2017 16:54   Mr Thoracic Spine Wo Contrast  Result Date: 09/04/2017 CLINICAL DATA:  Patient fell 09/02/2017. Severe back pain. Difficult to ambulate. EXAM: MRI THORACIC AND LUMBAR SPINE WITHOUT CONTRAST TECHNIQUE: Multiplanar and multiecho pulse sequences of the thoracic and lumbar spine were obtained without intravenous contrast. COMPARISON:  Plain films 09/02/2017. CT lumbar spine earlier today. FINDINGS: MRI THORACIC SPINE FINDINGS Alignment:   Physiologic. Vertebrae: No acute compression fracture or worrisome osseous lesion. Cord:  No significant cord compression or abnormal cord signal. Paraspinal and other soft tissues: Large goiter, substernal nodule extending on the LEFT. Median sternotomy. Gallstone. RIGHT pleural effusion. RIGHT hepatic cyst, approximate 1 x 2 cm. Disc levels: At T7-8 there is a LEFT paracentral protrusion. This does not result in stenosis or cord flattening. At T8-9 there is a central protrusion associated with posterior element hypertrophy. The T8-9 disc is mildly hyperintense, but there are no signs of infection. There is a RIGHT-sided facet joint effusion associated with asymmetric bony overgrowth. At T10-11, there is a shallow central protrusion with borderline stenosis. At T11-12, there is asymmetric facet arthropathy on the LEFT. MRI LUMBAR SPINE FINDINGS Segmentation:  Standard. Alignment: 3 mm anterolisthesis L3-4 is facet mediated there is degenerative scoliosis convex RIGHT at the thoracolumbar junction, approximately 30 degrees. Vertebrae: No compression fracture or worrisome osseous lesion. Endplate reactive changes. Conus medullaris and cauda equina: Conus extends to the L1-L2 level. The conus appears normal. The cauda equina is compressed and displaced at multiple levels, which appears to be due to a combination of spondylosis and scoliosis. There is some areas, such as opposite L4 (series 6, image 22) where the CSF has an appearance mimicking a subdural effusion or hygroma, there are no visible signs of intraspinal hematoma. Paraspinal and other soft tissues: The bladder is no longer distended. There is no retroperitoneal adenopathy. Disc levels: L1-L2: Disc space narrowing. Shallow central protrusion. Asymmetric facet arthropathy on the LEFT. Foraminal narrowing could affect the LEFT L1 nerve root. L2-L3: Disc space narrowing. Annular bulge with osseous spurring. Posterior element hypertrophy. Moderate stenosis.  BILATERAL L2 and L3 nerve root impingement are possible. L3-L4: Disc space narrowing. 3 mm anterolisthesis. Central protrusion. Posterior element hypertrophy. Severe stenosis. BILATERAL L4 nerve root impingement in the canal. RIGHT L3 nerve root impingement in the foramen. Interspinous bursa edema, consistent with Baastrup's disease. L4-L5: Disc space narrowing osseous spurring. Annular bulge. Posterior element hypertrophy. Moderate stenosis. RIGHT greater than  LEFT L5 nerve root impingement. L5-S1: Disc hyperintensity without signs of infection. Central protrusion. Posterior element hypertrophy. Mild stenosis. BILATERAL subarticular zone and foraminal zone narrowing affecting the L5 and S1 nerve roots. IMPRESSION: MR THORACIC SPINE IMPRESSION No thoracic spine compression fracture or visible hematoma. Multilevel spondylosis, worst at T8-9. mild stenosis at this level without thoracic cord compression. MR LUMBAR SPINE IMPRESSION No lumbar spine compression fracture or visible hematoma. Mild anterolisthesis at L3-4 is degenerative, not posttraumatic. Severe spinal stenosis at L3-4 relates to slip, posterior element hypertrophy, and central protrusion, affecting the L3 and L4 nerve roots. See discussion above. Moderate stenosis at L4-5 and L5-S1, related to disc space narrowing, bony overgrowth, disc material, and posterior element hypertrophy. Electronically Signed   By: Staci Righter M.D.   On: 09/04/2017 19:53   Mr Lumbar Spine Wo Contrast  Result Date: 09/04/2017 CLINICAL DATA:  Patient fell 09/02/2017. Severe back pain. Difficult to ambulate. EXAM: MRI THORACIC AND LUMBAR SPINE WITHOUT CONTRAST TECHNIQUE: Multiplanar and multiecho pulse sequences of the thoracic and lumbar spine were obtained without intravenous contrast. COMPARISON:  Plain films 09/02/2017. CT lumbar spine earlier today. FINDINGS: MRI THORACIC SPINE FINDINGS Alignment:  Physiologic. Vertebrae: No acute compression fracture or worrisome  osseous lesion. Cord:  No significant cord compression or abnormal cord signal. Paraspinal and other soft tissues: Large goiter, substernal nodule extending on the LEFT. Median sternotomy. Gallstone. RIGHT pleural effusion. RIGHT hepatic cyst, approximate 1 x 2 cm. Disc levels: At T7-8 there is a LEFT paracentral protrusion. This does not result in stenosis or cord flattening. At T8-9 there is a central protrusion associated with posterior element hypertrophy. The T8-9 disc is mildly hyperintense, but there are no signs of infection. There is a RIGHT-sided facet joint effusion associated with asymmetric bony overgrowth. At T10-11, there is a shallow central protrusion with borderline stenosis. At T11-12, there is asymmetric facet arthropathy on the LEFT. MRI LUMBAR SPINE FINDINGS Segmentation:  Standard. Alignment: 3 mm anterolisthesis L3-4 is facet mediated there is degenerative scoliosis convex RIGHT at the thoracolumbar junction, approximately 30 degrees. Vertebrae: No compression fracture or worrisome osseous lesion. Endplate reactive changes. Conus medullaris and cauda equina: Conus extends to the L1-L2 level. The conus appears normal. The cauda equina is compressed and displaced at multiple levels, which appears to be due to a combination of spondylosis and scoliosis. There is some areas, such as opposite L4 (series 6, image 22) where the CSF has an appearance mimicking a subdural effusion or hygroma, there are no visible signs of intraspinal hematoma. Paraspinal and other soft tissues: The bladder is no longer distended. There is no retroperitoneal adenopathy. Disc levels: L1-L2: Disc space narrowing. Shallow central protrusion. Asymmetric facet arthropathy on the LEFT. Foraminal narrowing could affect the LEFT L1 nerve root. L2-L3: Disc space narrowing. Annular bulge with osseous spurring. Posterior element hypertrophy. Moderate stenosis. BILATERAL L2 and L3 nerve root impingement are possible. L3-L4: Disc  space narrowing. 3 mm anterolisthesis. Central protrusion. Posterior element hypertrophy. Severe stenosis. BILATERAL L4 nerve root impingement in the canal. RIGHT L3 nerve root impingement in the foramen. Interspinous bursa edema, consistent with Baastrup's disease. L4-L5: Disc space narrowing osseous spurring. Annular bulge. Posterior element hypertrophy. Moderate stenosis. RIGHT greater than LEFT L5 nerve root impingement. L5-S1: Disc hyperintensity without signs of infection. Central protrusion. Posterior element hypertrophy. Mild stenosis. BILATERAL subarticular zone and foraminal zone narrowing affecting the L5 and S1 nerve roots. IMPRESSION: MR THORACIC SPINE IMPRESSION No thoracic spine compression fracture or visible hematoma. Multilevel spondylosis,  worst at T8-9. mild stenosis at this level without thoracic cord compression. MR LUMBAR SPINE IMPRESSION No lumbar spine compression fracture or visible hematoma. Mild anterolisthesis at L3-4 is degenerative, not posttraumatic. Severe spinal stenosis at L3-4 relates to slip, posterior element hypertrophy, and central protrusion, affecting the L3 and L4 nerve roots. See discussion above. Moderate stenosis at L4-5 and L5-S1, related to disc space narrowing, bony overgrowth, disc material, and posterior element hypertrophy. Electronically Signed   By: Staci Righter M.D.   On: 09/04/2017 19:53    Procedures Procedures (including critical care time)  Medications Ordered in ED Medications  0.9 %  sodium chloride infusion ( Intravenous New Bag/Given 09/04/17 2107)  ondansetron (ZOFRAN) injection 4 mg (not administered)  morphine 4 MG/ML injection 4 mg (4 mg Intravenous Given 09/04/17 2118)  HYDROmorphone (DILAUDID) injection 1 mg (1 mg Intravenous Given 09/04/17 1610)  methocarbamol (ROBAXIN) injection 500 mg (500 mg Intramuscular Given 09/04/17 1618)  dexamethasone (DECADRON) injection 10 mg (10 mg Intravenous Given 09/04/17 1611)  HYDROmorphone (DILAUDID)  injection 0.5 mg (0.5 mg Intravenous Given 09/04/17 1738)     Initial Impression / Assessment and Plan / ED Course  I have reviewed the triage vital signs and the nursing notes.  Pertinent labs & imaging results that were available during my care of the patient were reviewed by me and considered in my medical decision making (see chart for details).    77 year old male with past medical history as above here with severe lower back pain and inability to ambulate due to this.  He does have some weakness in his right lower extremity.  Of note, he also has urinary retention with greater than 800 cc in his bladder.  Concern for possible cauda equina.  CT lumbar spine shows no fracture.  Will obtain MR, Place Foley, and reassess.  No fevers and pain began after fall.  Do not suspect infectious etiology.  MRI as below.  Patient has multiple areas of disc contact with cauda equina with severe disc herniation and spondylolysis.  Discussed the case with Dr. Annette Stable of neurosurgery.  Will admit to Dr. Annette Stable service at [facility].  IV steroids given.  Patient updated and in agreement.  Repeat neurological exam remains unchanged.  Final Clinical Impressions(s) / ED Diagnoses   Final diagnoses:  Spinal stenosis of lumbosacral region  Lumbar back pain    ED Discharge Orders    None       Duffy Bruce, MD 09/04/17 2240

## 2017-09-04 NOTE — H&P (Signed)
Joe Shah is an 77 y.o. male.   Chief Complaint: Back pain HPI: 77 year old male status post prior L3-4 decompressive surgery done many years ago.  Patient reports that for the past 2 weeks he has been feeling poorly with generalized myalgias and fatigue.  Patient recently diagnosed with a viral illness by his primary care doctor.  In his weakened state 3 days ago the patient stumbled and fell landing awkwardly.  Since that time the patient has had marked lumbar pain.  The pain itself does not radiate.  It does not cause any weakness.  He has some chronic numbness and tingling in both lower extremities which is unchanged.  He has had difficulty urinating however.  Postvoid residual and bladder scan demonstrated severe bladder distention.  Situation complicated by the patient's age and a history of bladder carcinoma.  The patient denies any sacral anesthesia.  Patient's pain greatly increases with attempts to stand or walk.  Past Medical History:  Diagnosis Date  . ADD (attention deficit disorder with hyperactivity)   . Aortic stenosis   . Aortic valve disorders   . Arthritis   . Bruises easily   . Cancer [facility])    bladder cancer  . Head injury    at age 53 from [place]  . Heart murmur   . HOH (hard of hearing)   . Hyperlipidemia    takes Zocor daily  . Microscopic hematuria    managed by medical MD Dr.Cynthia Melina Copa in [place]  . Other and unspecified hyperlipidemia   . Other B-complex deficiencies   . Scoliosis   . Shortness of breath    with exertion   . Unspecified essential hypertension    takes Accupril daily  . Urinary frequency     Past Surgical History:  Procedure Laterality Date  . AORTIC VALVE REPLACEMENT  08/18/2011   Procedure: AORTIC VALVE REPLACEMENT (AVR);  Surgeon: Grace Isaac, MD;  Location: [place];  Service: Open Heart Surgery;  Laterality: N/A;  On pump.  Marland Kitchen BACK SURGERY  2009  . CARDIAC CATHETERIZATION  2008   moderate AS. Normal coronary arteries except  for mild plaque in LAD  . COLONOSCOPY    . HEMORRHOID SURGERY  30+yrs ago  . KNEE ARTHROSCOPY  15+yrs ago;2012   x 2 left  . THYROIDECTOMY, PARTIAL  20+yrs ago  . TONSILLECTOMY     as a child    Family History  Problem Relation Age of Onset  . Heart failure Mother        died 79  . Brain cancer Father        died 39  . Anesthesia problems Neg Hx   . Hypotension Neg Hx   . Malignant hyperthermia Neg Hx   . Pseudochol deficiency Neg Hx    Social History:  reports that he quit smoking about 16 years ago. His smoking use included cigarettes. He started smoking about 60 years ago. He has a 17.50 pack-year smoking history. He quit smokeless tobacco use about 6 years ago. His smokeless tobacco use included snuff. He reports that he drinks about 0.6 oz of alcohol per week. He reports that he does not use drugs.  Allergies: No Known Allergies  Medications Prior to Admission  Medication Sig Dispense Refill  . amphetamine-dextroamphetamine (ADDERALL) 10 MG tablet Take 10 mg by mouth daily as needed.     Marland Kitchen aspirin 81 MG tablet Take 81 mg by mouth daily.    Marland Kitchen gabapentin (NEURONTIN) 300 MG capsule Take  300 mg by mouth daily as needed (for pain).     Marland Kitchen HYDROcodone-acetaminophen (NORCO/VICODIN) 5-325 MG tablet Take 1 tablet by mouth every 6 (six) hours as needed. 15 tablet 0  . losartan (COZAAR) 50 MG tablet Take 1 tablet (50 mg total) by mouth daily. 90 tablet 3  . meloxicam (MOBIC) 15 MG tablet Take 15 mg by mouth as needed.    . naproxen (NAPROSYN) 375 MG tablet Take 1 tablet (375 mg total) by mouth 2 (two) times daily. 14 tablet 0  . simvastatin (ZOCOR) 20 MG tablet TAKE 1 TABLET DAILY IN THE EVENING 90 tablet 3  . sodium chloride (OCEAN) 0.65 % SOLN nasal spray Place 2 sprays into both nostrils as needed for congestion.    . tamsulosin (FLOMAX) 0.4 MG CAPS capsule Take 1 capsule by mouth daily.    Marland Kitchen acetaminophen (TYLENOL) 325 MG tablet Take 2 tablets (650 mg total) by mouth every 6 (six)  hours as needed. (Patient not taking: Reported on 09/04/2017) 30 tablet 0    Results for orders placed or performed during the hospital encounter of 09/04/17 (from the past 48 hour(s))  CBC with Differential     Status: Abnormal   Collection Time: 09/04/17  4:11 PM  Result Value Ref Range   WBC 11.5 (H) 4.0 - 10.5 K/uL   RBC 4.60 4.22 - 5.81 MIL/uL   Hemoglobin 12.6 (L) 13.0 - 17.0 g/dL   HCT 37.2 (L) 39.0 - 52.0 %   MCV 80.9 78.0 - 100.0 fL   MCH 27.4 26.0 - 34.0 pg   MCHC 33.9 30.0 - 36.0 g/dL   RDW 14.1 11.5 - 15.5 %   Platelets 210 150 - 400 K/uL   Neutrophils Relative % 82 %   Neutro Abs 9.5 (H) 1.7 - 7.7 K/uL   Lymphocytes Relative 10 %   Lymphs Abs 1.2 0.7 - 4.0 K/uL   Monocytes Relative 7 %   Monocytes Absolute 0.8 0.1 - 1.0 K/uL   Eosinophils Relative 1 %   Eosinophils Absolute 0.1 0.0 - 0.7 K/uL   Basophils Relative 0 %   Basophils Absolute 0.0 0.0 - 0.1 K/uL    Comment: Performed at [facility], [place] [address] [place], [place] [address]  Basic metabolic panel     Status: Abnormal   Collection Time: 09/04/17  4:11 PM  Result Value Ref Range   Sodium 137 135 - 145 mmol/L   Potassium 4.4 3.5 - 5.1 mmol/L   Chloride 102 101 - 111 mmol/L   CO2 25 22 - 32 mmol/L   Glucose, Bld 122 (H) 65 - 99 mg/dL   BUN 34 (H) 6 - 20 mg/dL   Creatinine, Ser 1.19 0.61 - 1.24 mg/dL   Calcium 9.3 8.9 - 10.3 mg/dL   GFR calc non Af Amer 58 (L) >60 mL/min   GFR calc Af Amer >60 >60 mL/min    Comment: (NOTE) The eGFR has been calculated using the CKD EPI equation. This calculation has not been validated in all clinical situations. eGFR's persistently <60 mL/min signify possible Chronic Kidney Disease.    Anion gap 10 5 - 15    Comment: Performed at [facility], [place] [address] [place], [place] [address]  Urinalysis, Routine w reflex microscopic     Status: Abnormal   Collection Time: 09/04/17  6:06 PM  Result Value Ref Range   Color, Urine  YELLOW YELLOW   APPearance CLEAR CLEAR   Specific Gravity,  Urine 1.018 1.005 - 1.030   pH 5.0 5.0 - 8.0   Glucose, UA NEGATIVE NEGATIVE mg/dL   Hgb urine dipstick MODERATE (A) NEGATIVE   Bilirubin Urine NEGATIVE NEGATIVE   Ketones, ur 5 (A) NEGATIVE mg/dL   Protein, ur NEGATIVE NEGATIVE mg/dL   Nitrite NEGATIVE NEGATIVE   Leukocytes, UA NEGATIVE NEGATIVE   RBC / HPF 6-30 0 - 5 RBC/hpf   WBC, UA 0-5 0 - 5 WBC/hpf   Bacteria, UA FEW (A) NONE SEEN   Squamous Epithelial / LPF 0-5 (A) NONE SEEN   Mucus PRESENT     Comment: Performed at [facility], [place] [address] [place], [place] [address]   Dg Chest 2 View  Result Date: 09/04/2017 CLINICAL DATA:  Cough, fatigue EXAM: CHEST  2 VIEW COMPARISON:  09/10/2011 FINDINGS: There are low lung volumes. There is no focal parenchymal opacity. There is no pleural effusion or pneumothorax. The heart and mediastinal contours are unremarkable. 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 IMPRESSION: No active cardiopulmonary disease. Electronically Signed   By: Kathreen Devoid   On: 09/04/2017 17:03   Ct Lumbar Spine Wo Contrast  Result Date: 09/04/2017 CLINICAL DATA:  Witnessed fall Friday.  Not ambulatory. EXAM: CT LUMBAR SPINE WITHOUT CONTRAST TECHNIQUE: Multidetector CT imaging of the lumbar spine was performed without intravenous contrast administration. Multiplanar CT image reconstructions were also generated. COMPARISON:  Plain films 09/02/2017. FINDINGS: Segmentation: Standard. Alignment: Degenerative scoliosis convex RIGHT thoracolumbar junction. Mild anterolisthesis L3 on L4, 3 mm. Vertebrae: No acute fracture or focal pathologic process. Paraspinal and other soft tissues: Negative. Aortic atherosclerosis. Marked bladder distention. There appears to be marked prostate enlargement. Disc levels: L1-L2: Advanced disc space narrowing with osseous spurring. Vacuum phenomenon. LEFT  subarticular zone and foraminal zone narrowing. L2-L3: Moderate disc space narrowing. Osseous spurring. Facet arthropathy. Mild stenosis. No definite subarticular zone narrowing. Significant foraminal narrowing on the LEFT. L3-L4: Moderate disc space narrowing. 3 mm anterolisthesis. Calcified protrusion in the midline. Marked posterior element hypertrophy, primarily facet overgrowth. Severe stenosis. RIGHT greater than LEFT L4 nerve root impingement due to subarticular zone. The RIGHT L3 nerve root may be compressed in the foramen. L4-L5: Severe disc space narrowing with vacuum phenomenon. Osseous spurring. Advanced posterior element hypertrophy. Mild to moderate stenosis. RIGHT sided foraminal zone narrowing could affect the L4 nerve root. L5-S1: Moderate to advanced disc space narrowing. Facet arthropathy. No stenosis or subarticular zone narrowing, but LEFT-sided foraminal narrowing could affect the L5 nerve root. IMPRESSION: Multilevel spondylosis, with varying degrees of stenosis, subarticular zone narrowing and foraminal zone narrowing. 3 mm anterolisthesis L3-4 appears facet mediated. There is no significant slip to suggest ligamentous injury. No visible lower thoracic or lumbar spine compression fracture is evident. Aortic Atherosclerosis (ICD10-I70.0). Marked bladder distention, query bladder outlet obstruction Electronically Signed   By: Staci Righter M.D.   On: 09/04/2017 16:54   Mr Thoracic Spine Wo Contrast  Result Date: 09/04/2017 CLINICAL DATA:  Patient fell 09/02/2017. Severe back pain. Difficult to ambulate. EXAM: MRI THORACIC AND LUMBAR SPINE WITHOUT CONTRAST TECHNIQUE: Multiplanar and multiecho pulse sequences of the thoracic and lumbar spine were obtained without intravenous contrast. COMPARISON:  Plain films 09/02/2017. CT lumbar spine earlier today. FINDINGS: MRI THORACIC SPINE FINDINGS Alignment:  Physiologic. Vertebrae: No acute compression fracture or worrisome osseous lesion. Cord:  No  significant cord compression or abnormal cord signal. Paraspinal and other soft tissues: Large goiter, substernal nodule extending on the LEFT.  Median sternotomy. Gallstone. RIGHT pleural effusion. RIGHT hepatic cyst, approximate 1 x 2 cm. Disc levels: At T7-8 there is a LEFT paracentral protrusion. This does not result in stenosis or cord flattening. At T8-9 there is a central protrusion associated with posterior element hypertrophy. The T8-9 disc is mildly hyperintense, but there are no signs of infection. There is a RIGHT-sided facet joint effusion associated with asymmetric bony overgrowth. At T10-11, there is a shallow central protrusion with borderline stenosis. At T11-12, there is asymmetric facet arthropathy on the LEFT. MRI LUMBAR SPINE FINDINGS Segmentation:  Standard. Alignment: 3 mm anterolisthesis L3-4 is facet mediated there is degenerative scoliosis convex RIGHT at the thoracolumbar junction, approximately 30 degrees. Vertebrae: No compression fracture or worrisome osseous lesion. Endplate reactive changes. Conus medullaris and cauda equina: Conus extends to the L1-L2 level. The conus appears normal. The cauda equina is compressed and displaced at multiple levels, which appears to be due to a combination of spondylosis and scoliosis. There is some areas, such as opposite L4 (series 6, image 22) where the CSF has an appearance mimicking a subdural effusion or hygroma, there are no visible signs of intraspinal hematoma. Paraspinal and other soft tissues: The bladder is no longer distended. There is no retroperitoneal adenopathy. Disc levels: L1-L2: Disc space narrowing. Shallow central protrusion. Asymmetric facet arthropathy on the LEFT. Foraminal narrowing could affect the LEFT L1 nerve root. L2-L3: Disc space narrowing. Annular bulge with osseous spurring. Posterior element hypertrophy. Moderate stenosis. BILATERAL L2 and L3 nerve root impingement are possible. L3-L4: Disc space narrowing. 3 mm  anterolisthesis. Central protrusion. Posterior element hypertrophy. Severe stenosis. BILATERAL L4 nerve root impingement in the canal. RIGHT L3 nerve root impingement in the foramen. Interspinous bursa edema, consistent with Baastrup's disease. L4-L5: Disc space narrowing osseous spurring. Annular bulge. Posterior element hypertrophy. Moderate stenosis. RIGHT greater than LEFT L5 nerve root impingement. L5-S1: Disc hyperintensity without signs of infection. Central protrusion. Posterior element hypertrophy. Mild stenosis. BILATERAL subarticular zone and foraminal zone narrowing affecting the L5 and S1 nerve roots. IMPRESSION: MR THORACIC SPINE IMPRESSION No thoracic spine compression fracture or visible hematoma. Multilevel spondylosis, worst at T8-9. mild stenosis at this level without thoracic cord compression. MR LUMBAR SPINE IMPRESSION No lumbar spine compression fracture or visible hematoma. Mild anterolisthesis at L3-4 is degenerative, not posttraumatic. Severe spinal stenosis at L3-4 relates to slip, posterior element hypertrophy, and central protrusion, affecting the L3 and L4 nerve roots. See discussion above. Moderate stenosis at L4-5 and L5-S1, related to disc space narrowing, bony overgrowth, disc material, and posterior element hypertrophy. Electronically Signed   By: Staci Righter M.D.   On: 09/04/2017 19:53   Mr Lumbar Spine Wo Contrast  Result Date: 09/04/2017 CLINICAL DATA:  Patient fell 09/02/2017. Severe back pain. Difficult to ambulate. EXAM: MRI THORACIC AND LUMBAR SPINE WITHOUT CONTRAST TECHNIQUE: Multiplanar and multiecho pulse sequences of the thoracic and lumbar spine were obtained without intravenous contrast. COMPARISON:  Plain films 09/02/2017. CT lumbar spine earlier today. FINDINGS: MRI THORACIC SPINE FINDINGS Alignment:  Physiologic. Vertebrae: No acute compression fracture or worrisome osseous lesion. Cord:  No significant cord compression or abnormal cord signal. Paraspinal and  other soft tissues: Large goiter, substernal nodule extending on the LEFT. Median sternotomy. Gallstone. RIGHT pleural effusion. RIGHT hepatic cyst, approximate 1 x 2 cm. Disc levels: At T7-8 there is a LEFT paracentral protrusion. This does not result in stenosis or cord flattening. At T8-9 there is a central protrusion associated with posterior element hypertrophy. The T8-9 disc  is mildly hyperintense, but there are no signs of infection. There is a RIGHT-sided facet joint effusion associated with asymmetric bony overgrowth. At T10-11, there is a shallow central protrusion with borderline stenosis. At T11-12, there is asymmetric facet arthropathy on the LEFT. MRI LUMBAR SPINE FINDINGS Segmentation:  Standard. Alignment: 3 mm anterolisthesis L3-4 is facet mediated there is degenerative scoliosis convex RIGHT at the thoracolumbar junction, approximately 30 degrees. Vertebrae: No compression fracture or worrisome osseous lesion. Endplate reactive changes. Conus medullaris and cauda equina: Conus extends to the L1-L2 level. The conus appears normal. The cauda equina is compressed and displaced at multiple levels, which appears to be due to a combination of spondylosis and scoliosis. There is some areas, such as opposite L4 (series 6, image 22) where the CSF has an appearance mimicking a subdural effusion or hygroma, there are no visible signs of intraspinal hematoma. Paraspinal and other soft tissues: The bladder is no longer distended. There is no retroperitoneal adenopathy. Disc levels: L1-L2: Disc space narrowing. Shallow central protrusion. Asymmetric facet arthropathy on the LEFT. Foraminal narrowing could affect the LEFT L1 nerve root. L2-L3: Disc space narrowing. Annular bulge with osseous spurring. Posterior element hypertrophy. Moderate stenosis. BILATERAL L2 and L3 nerve root impingement are possible. L3-L4: Disc space narrowing. 3 mm anterolisthesis. Central protrusion. Posterior element hypertrophy. Severe  stenosis. BILATERAL L4 nerve root impingement in the canal. RIGHT L3 nerve root impingement in the foramen. Interspinous bursa edema, consistent with Baastrup's disease. L4-L5: Disc space narrowing osseous spurring. Annular bulge. Posterior element hypertrophy. Moderate stenosis. RIGHT greater than LEFT L5 nerve root impingement. L5-S1: Disc hyperintensity without signs of infection. Central protrusion. Posterior element hypertrophy. Mild stenosis. BILATERAL subarticular zone and foraminal zone narrowing affecting the L5 and S1 nerve roots. IMPRESSION: MR THORACIC SPINE IMPRESSION No thoracic spine compression fracture or visible hematoma. Multilevel spondylosis, worst at T8-9. mild stenosis at this level without thoracic cord compression. MR LUMBAR SPINE IMPRESSION No lumbar spine compression fracture or visible hematoma. Mild anterolisthesis at L3-4 is degenerative, not posttraumatic. Severe spinal stenosis at L3-4 relates to slip, posterior element hypertrophy, and central protrusion, affecting the L3 and L4 nerve roots. See discussion above. Moderate stenosis at L4-5 and L5-S1, related to disc space narrowing, bony overgrowth, disc material, and posterior element hypertrophy. Electronically Signed   By: Staci Righter M.D.   On: 09/04/2017 19:53    Pertinent items noted in HPI and remainder of comprehensive ROS otherwise negative.  Blood pressure 130/76, pulse 93, temperature 98.2 F (36.8 C), temperature source Oral, resp. rate 18, height '5\' 10"'$  (1.778 m), weight 81.6 kg (180 lb), SpO2 93 %.  The patient is awake and alert.  He is oriented and appropriate.  He does not appear in any distress currently.  Cranial nerve function normal bilaterally.  Motor examination 5/5 in both upper and lower extremities bilaterally.  Sensory examination with some patchy distal sensory loss in both lower extremities.  Reflexes hypoactive.  Straight leg raising causes increase in his back pain does not cause any radicular  pain.  Patient with normal sacral sensation.  Examination head ears eyes nose throat is unremarkable.  Chest and abdomen are benign.  Extremities are free from injury deformity. Assessment/Plan The patient does have evidence of marked disc degeneration with associated spondylosis and lateral listhesis at L3-4 with resultant severe spinal stenosis.  There is no evidence of acute fracture disc herniation or hemorrhage however.  I do not believe the patient is currently suffering symptoms from cauda equina  syndrome but rather is having back pain secondary to his recent fall with concussive injury and strain to his lumbar region.  I believe his urinary retention is primarily secondary to his pain level and coexistent bladder outlet obstruction.  Plan admission to the hospital with treatment with IV steroids and narcotics for pain control.  Begin Flomax for bladder outlet obstruction.  Continue Foley catheter for now.  We will begin to mobilize tomorrow with therapy.  Mallie Mussel A Isyss Espinal 09/04/2017, 11:49 PM

## 2017-09-04 NOTE — ED Notes (Signed)
Patient transported to MRI 

## 2017-09-04 NOTE — ED Notes (Signed)
Bed: WA03 Expected date:  Expected time:  Means of arrival:  Comments: fall 

## 2017-09-04 NOTE — ED Triage Notes (Signed)
Pt arrived via [facility]. Pt had wittnessed fall Friday. Was seen at [facility] and discharged same day and was given oxycodone. Pt stated it does not help hardly at all. Pt comes from home. Pt is AOx4. Pt is not ambulatory due to the amount of pain. Pt had previous injury before related to work. Pt is was on disability.

## 2017-09-05 DIAGNOSIS — G834 Cauda equina syndrome: Secondary | ICD-10-CM | POA: Diagnosis present

## 2017-09-05 LAB — MRSA PCR SCREENING: MRSA BY PCR: NEGATIVE

## 2017-09-05 MED ORDER — AMPHETAMINE-DEXTROAMPHETAMINE 10 MG PO TABS
10.0000 mg | ORAL_TABLET | Freq: Every day | ORAL | Status: DC
  Filled 2017-09-05 (×3): qty 1

## 2017-09-05 MED ORDER — ONDANSETRON HCL 4 MG/2ML IJ SOLN: 4.0000 mg | Freq: Four times a day (QID) | INTRAMUSCULAR | Status: DC | PRN

## 2017-09-05 MED ORDER — ACETAMINOPHEN 650 MG RE SUPP: 650.0000 mg | Freq: Four times a day (QID) | RECTAL | Status: DC | PRN

## 2017-09-05 MED ORDER — POLYETHYLENE GLYCOL 3350 17 G PO PACK
17.0000 g | PACK | Freq: Every day | ORAL | Status: DC | PRN
  Administered 2017-09-06: 17 g via ORAL
  Filled 2017-09-05: qty 1

## 2017-09-05 MED ORDER — DEXAMETHASONE SODIUM PHOSPHATE 10 MG/ML IJ SOLN
10.0000 mg | Freq: Four times a day (QID) | INTRAMUSCULAR | Status: DC
  Administered 2017-09-05 – 2017-09-08 (×15): 10 mg via INTRAVENOUS
  Filled 2017-09-05 (×14): qty 1

## 2017-09-05 MED ORDER — GABAPENTIN 300 MG PO CAPS: 300.0000 mg | ORAL_CAPSULE | Freq: Every day | ORAL | Status: DC | PRN

## 2017-09-05 MED ORDER — ACETAMINOPHEN 325 MG PO TABS: 650.0000 mg | ORAL_TABLET | Freq: Four times a day (QID) | ORAL | Status: DC | PRN

## 2017-09-05 MED ORDER — TAMSULOSIN HCL 0.4 MG PO CAPS
0.8000 mg | ORAL_CAPSULE | Freq: Every day | ORAL | Status: DC
  Administered 2017-09-05 – 2017-09-08 (×5): 0.8 mg via ORAL
  Filled 2017-09-05 (×4): qty 2

## 2017-09-05 MED ORDER — SIMVASTATIN 20 MG PO TABS
20.0000 mg | ORAL_TABLET | Freq: Every evening | ORAL | Status: DC
  Administered 2017-09-05 – 2017-09-07 (×3): 20 mg via ORAL
  Filled 2017-09-05 (×3): qty 1

## 2017-09-05 MED ORDER — MELOXICAM 7.5 MG PO TABS
15.0000 mg | ORAL_TABLET | Freq: Every day | ORAL | Status: DC
  Administered 2017-09-05 – 2017-09-08 (×4): 15 mg via ORAL
  Filled 2017-09-05 (×4): qty 2

## 2017-09-05 MED ORDER — ONDANSETRON HCL 4 MG PO TABS: 4.0000 mg | ORAL_TABLET | Freq: Four times a day (QID) | ORAL | Status: DC | PRN

## 2017-09-05 MED ORDER — DOCUSATE SODIUM 100 MG PO CAPS
100.0000 mg | ORAL_CAPSULE | Freq: Two times a day (BID) | ORAL | Status: DC
  Administered 2017-09-05 – 2017-09-08 (×6): 100 mg via ORAL
  Filled 2017-09-05 (×6): qty 1

## 2017-09-05 MED ORDER — SODIUM CHLORIDE 0.9 % IV SOLN: 250.0000 mL | INTRAVENOUS | Status: DC | PRN

## 2017-09-05 MED ORDER — ADULT MULTIVITAMIN W/MINERALS CH
1.0000 | ORAL_TABLET | Freq: Every day | ORAL | Status: DC
  Administered 2017-09-05 – 2017-09-08 (×4): 1 via ORAL
  Filled 2017-09-05 (×4): qty 1

## 2017-09-05 MED ORDER — KETOROLAC TROMETHAMINE 15 MG/ML IJ SOLN: 15.0000 mg | Freq: Four times a day (QID) | INTRAMUSCULAR | Status: DC | PRN

## 2017-09-05 MED ORDER — HYDROCODONE-ACETAMINOPHEN 5-325 MG PO TABS: 1.0000 | ORAL_TABLET | Freq: Four times a day (QID) | ORAL | Status: DC | PRN

## 2017-09-05 MED ORDER — SODIUM CHLORIDE 0.9% FLUSH
3.0000 mL | Freq: Two times a day (BID) | INTRAVENOUS | Status: DC
  Administered 2017-09-05 – 2017-09-08 (×8): 3 mL via INTRAVENOUS

## 2017-09-05 MED ORDER — SALINE SPRAY 0.65 % NA SOLN: 2.0000 | NASAL | Status: DC | PRN

## 2017-09-05 MED ORDER — HYDROCODONE-ACETAMINOPHEN 5-325 MG PO TABS
1.0000 | ORAL_TABLET | ORAL | Status: DC | PRN
  Administered 2017-09-05 (×3): 2 via ORAL
  Administered 2017-09-06 – 2017-09-07 (×5): 1 via ORAL
  Filled 2017-09-05: qty 1
  Filled 2017-09-05: qty 2
  Filled 2017-09-05 (×3): qty 1
  Filled 2017-09-05: qty 2
  Filled 2017-09-05: qty 1

## 2017-09-05 MED ORDER — SODIUM CHLORIDE 0.9% FLUSH: 3.0000 mL | INTRAVENOUS | Status: DC | PRN

## 2017-09-05 NOTE — Evaluation (Signed)
Occupational Therapy Evaluation Patient Details Name: Joe Shah MRN: [id number] DOB: [birth year] Today's Date: 09/05/2017    History of Present Illness 77 yo with prior L3-4 decompression who was feeling poorly and fell with increased lumbar pain . Noted to have marked disc degeneration with associated spondylosis and lateral listhesis at L3-4 with resultant severe spinal stenosis admitted and treated with IV steroids. PMHx: ADD, AS, bladder CA, HLD, scoliosis, HTN   Clinical Impression   Pt admitted with above. He demonstrates the below listed deficits and will benefit from continued OT to maximize safety and independence with BADLs.  Pt presents to OT with generalized weakness, impaired balance, decreased activity tolerance and pain.  He was able to ambulate to BR with min A this pm.  Stood to bathe and was able to bathe UB with mod A in standing.   He lives with spouse to whom he is primary caregiver.   He was fully independent PTA. Anticipate he will require SNF level rehab at discharge.       Follow Up Recommendations  SNF;Supervision/Assistance - 24 hour    Equipment Recommendations  None recommended by OT    Recommendations for Other Services       Precautions / Restrictions Precautions Precautions: Fall;Back Precaution Comments: Pt able to maintain back precautions with min cues       Mobility Bed Mobility Overal bed mobility: Needs Assistance Bed Mobility: Rolling;Sidelying to Sit;Sit to Sidelying Rolling: Min guard Sidelying to sit: Min assist     Sit to sidelying: Min assist General bed mobility comments: assist to lower and lift LEs and to lift trunk   Transfers Overall transfer level: Needs assistance Equipment used: Rolling walker (2 wheeled) Transfers: Sit to/from [facility] Sit to Stand: Min assist;From elevated surface Stand pivot transfers: Min assist       General transfer comment: assist to steady     Balance Overall balance  assessment: Needs assistance Sitting-balance support: Feet supported Sitting balance-Leahy Scale: Fair     Standing balance support: During functional activity;Single extremity supported Standing balance-Leahy Scale: Poor Standing balance comment: requires UE support                            ADL either performed or assessed with clinical judgement   ADL Overall ADL's : Needs assistance/impaired Eating/Feeding: Independent   Grooming: Wash/dry hands;Wash/dry face;Oral care;Brushing hair;Standing;Minimal assistance   Upper Body Bathing: Moderate assistance;Standing   Lower Body Bathing: Maximal assistance;Sit to/from stand   Upper Body Dressing : Moderate assistance;Sitting;Standing   Lower Body Dressing: Total assistance;Sit to/from stand   Toilet Transfer: Minimal assistance;Ambulation;Comfort height toilet;BSC;Grab bars;RW   Toileting- Clothing Manipulation and Hygiene: Maximal assistance;Sit to/from stand       Functional mobility during ADLs: Minimal assistance;Rolling walker General ADL Comments: moves slowly.  Is very guarded.  Stood for bath and was able to assist with UB as well as peri area      Vision         Perception     Praxis      Pertinent Vitals/Pain Pain Assessment: 0-10 Pain Score: 3  Pain Location: back  Pain Descriptors / Indicators: Aching;Cramping Pain Intervention(s): Monitored during session;Premedicated before session     Hand Dominance Right   Extremity/Trunk Assessment Upper Extremity Assessment Upper Extremity Assessment: Generalized weakness;RUE deficits/detail RUE Deficits / Details: impaired shoulder ROM (limited to 45-50*).  Reports he sees Dr Gladstone Lighter who was planning on surgery  Lower Extremity Assessment Lower Extremity Assessment: Defer to PT evaluation   Cervical / Trunk Assessment Cervical / Trunk Assessment: Kyphotic   Communication Communication Communication: HOH   Cognition Arousal/Alertness:  Awake/alert Behavior During Therapy: WFL for tasks assessed/performed Overall Cognitive Status: Within Functional Limits for tasks assessed Area of Impairment: Memory                     Memory: Decreased short-term memory         General Comments: pt at one point repeating need for denture cup x 5 even after acknowledgement of statement   General Comments       Exercises     Shoulder Instructions      Home Living Family/patient expects to be discharged to:: Private residence Living Arrangements: Spouse/significant other Available Help at Discharge: Family;Available PRN/intermittently Type of Home: House Home Access: Ramped entrance     Home Layout: One level     Bathroom Shower/Tub: Occupational psychologist: Handicapped height     Home Equipment: Environmental consultant - 2 wheels;None;Bedside commode;Shower seat;Grab bars - tub/shower   Additional Comments: Pt is primary caregiver for wife       Prior Functioning/Environment Level of Independence: Independent                 OT Problem List: Decreased activity tolerance;Impaired balance (sitting and/or standing);Decreased safety awareness;Decreased knowledge of use of DME or AE;Decreased knowledge of precautions;Pain      OT Treatment/Interventions: Self-care/ADL training;DME and/or AE instruction;Therapeutic activities;Patient/family education;Balance training    OT Goals(Current goals can be found in the care plan section) Acute Rehab OT Goals Patient Stated Goal: return home and be out of pain OT Goal Formulation: With patient Time For Goal Achievement: 09/19/17 Potential to Achieve Goals: Good ADL Goals Pt Will Perform Grooming: with supervision;standing Pt Will Perform Upper Body Bathing: with supervision;sitting Pt Will Perform Lower Body Bathing: with supervision;sit to/from stand;with adaptive equipment Pt Will Perform Upper Body Dressing: with set-up;sitting Pt Will Perform Lower Body  Dressing: with supervision;with adaptive equipment;sit to/from stand Pt Will Transfer to Toilet: with supervision;ambulating;regular height toilet;bedside commode;grab bars Pt Will Perform Toileting - Clothing Manipulation and hygiene: with supervision;sit to/from stand Pt Will Perform Tub/Shower Transfer: Shower transfer;with supervision;ambulating;shower seat;grab bars;rolling walker  OT Frequency: Min 2X/week   Barriers to D/C: Decreased caregiver support          Co-evaluation              AM-PAC PT "6 Clicks" Daily Activity     Outcome Measure Help from another person eating meals?: None Help from another person taking care of personal grooming?: A Little Help from another person toileting, which includes using toliet, bedpan, or urinal?: A Lot Help from another person bathing (including washing, rinsing, drying)?: A Lot Help from another person to put on and taking off regular upper body clothing?: A Lot Help from another person to put on and taking off regular lower body clothing?: Total 6 Click Score: 14   End of Session Equipment Utilized During Treatment: Rolling walker Nurse Communication: Mobility status  Activity Tolerance: Patient tolerated treatment well Patient left: in bed;with call bell/phone within reach;with nursing/sitter in room  OT Visit Diagnosis: Unsteadiness on feet (R26.81);Pain Pain - part of body: (back )                Time: 1145-1210 OT Time Calculation (min): 25 min Charges:  OT General Charges $OT Visit: 1 Visit OT Evaluation $  OT Eval Moderate Complexity: 1 Mod OT Treatments $Self Care/Home Management : 8-22 mins G-Codes:     [facility], OTR/L [phone]   Lucille Passy M 09/05/2017, 12:21 PM

## 2017-09-05 NOTE — Progress Notes (Signed)
No new issues or problems overnight.  Patient reports that his back pain still is quite severe with attempts of standing up or ambulating.  No radiating numbness, paresthesias or weakness however.  Neurologic exam remains stable.  Continue IV Decadron and efforts at mobilization.  Remove catheter tomorrow and assess his bladder outlet obstruction symptoms

## 2017-09-05 NOTE — Evaluation (Signed)
Physical Therapy Evaluation Patient Details Name: Joe Shah MRN: [id number] DOB: 12-27-1940 Today's Date: 09/05/2017   History of Present Illness  77 yo with prior L3-4 decompression who was feeling poorly and fell with increased lumbar pain . Noted to have marked disc degeneration with associated spondylosis and lateral listhesis at L3-4 with resultant severe spinal stenosis admitted and treated with IV steroids. PMHx: ADD, AS, bladder CA, HLD, scoliosis, HTN  Clinical Impression  Pt eager to get OOB stating no pain on arrival in supine. Pt able to achieve sitting but after standing attempts pt reports return of pain and although rating it at a 3/10 demonstrates more pain. Pt educated for back precautions, transfers, gait and function. Pt with severely limited mobility by pain currently with mod-max assist to adjust in bed and inability to transfer OOB or walk. If pain and function not significantly improved then SNF warranted. Will continue to follow.      Follow Up Recommendations SNF;Supervision/Assistance - 24 hour    Equipment Recommendations  None recommended by PT    Recommendations for Other Services OT consult     Precautions / Restrictions Precautions Precautions: Fall;Back Precaution Comments: educated for back precautions to assist with pain reduction      Mobility  Bed Mobility Overal bed mobility: Needs Assistance Bed Mobility: Rolling;Sidelying to Sit;Sit to Sidelying Rolling: Min guard Sidelying to sit: Min assist     Sit to sidelying: Mod assist General bed mobility comments: cues for sequence with assist to elevate trunk and with return to bed assist to elevate legs to surface  Transfers Overall transfer level: Needs assistance   Transfers: Sit to/from Stand Sit to Stand: Min assist;From elevated surface         General transfer comment: min assist to stand from surface with pt unable to achieve full extension first trial, bed elevated and RW in  front with pt able to achieve standing and side step toward [facility] but could not mobilize further. Assist to rise and for moving RW with side stepping  Ambulation/Gait             General Gait Details: unable due to pain  Stairs            Wheelchair Mobility    Modified Rankin (Stroke Patients Only)       Balance Overall balance assessment: Needs assistance   Sitting balance-Leahy Scale: Fair       Standing balance-Leahy Scale: Poor                               Pertinent Vitals/Pain Pain Assessment: 0-10 Pain Score: 4  Pain Descriptors / Indicators: Aching;Cramping Pain Intervention(s): Limited activity within patient's tolerance;Repositioned;Monitored during session;Patient requesting pain meds-RN notified    Home Living Family/patient expects to be discharged to:: Private residence Living Arrangements: Spouse/significant other Available Help at Discharge: Family;Available PRN/intermittently Type of Home: House Home Access: Ramped entrance     Home Layout: One level Home Equipment: Walker - 2 wheels;None;Bedside commode;Shower seat      Prior Function Level of Independence: Independent               Hand Dominance        Extremity/Trunk Assessment   Upper Extremity Assessment Upper Extremity Assessment: Generalized weakness    Lower Extremity Assessment Lower Extremity Assessment: Generalized weakness    Cervical / Trunk Assessment Cervical / Trunk Assessment: Kyphotic  Communication   Communication:  HOH  Cognition Arousal/Alertness: Awake/alert Behavior During Therapy: WFL for tasks assessed/performed Overall Cognitive Status: Impaired/Different from baseline Area of Impairment: Memory                     Memory: Decreased short-term memory         General Comments: pt at one point repeating need for denture cup x 5 even after acknowledgement of statement      General Comments      Exercises      Assessment/Plan    PT Assessment Patient needs continued PT services  PT Problem List Decreased strength;Decreased mobility;Decreased safety awareness;Decreased activity tolerance;Decreased balance;Decreased knowledge of use of DME;Pain;Decreased knowledge of precautions       PT Treatment Interventions Gait training;Therapeutic exercise;Patient/family education;Functional mobility training;Therapeutic activities;DME instruction    PT Goals (Current goals can be found in the Care Plan section)  Acute Rehab PT Goals Patient Stated Goal: return home and be out of pain PT Goal Formulation: With patient/family Time For Goal Achievement: 09/19/17 Potential to Achieve Goals: Fair    Frequency Min 3X/week   Barriers to discharge Decreased caregiver support pt is caregiver for wife    Co-evaluation               AM-PAC PT "6 Clicks" Daily Activity  Outcome Measure Difficulty turning over in bed (including adjusting bedclothes, sheets and blankets)?: A Lot Difficulty moving from lying on back to sitting on the side of the bed? : Unable Difficulty sitting down on and standing up from a chair with arms (e.g., wheelchair, bedside commode, etc,.)?: Unable Help needed moving to and from a bed to chair (including a wheelchair)?: A Lot Help needed walking in hospital room?: A Lot Help needed climbing 3-5 steps with a railing? : Total 6 Click Score: 9    End of Session Equipment Utilized During Treatment: Gait belt Activity Tolerance: Patient limited by pain Patient left: in bed;with call bell/phone within reach;with family/visitor present Nurse Communication: Mobility status;Precautions PT Visit Diagnosis: Other abnormalities of gait and mobility (R26.89);Muscle weakness (generalized) (M62.81)    Time: 4008-6761 PT Time Calculation (min) (ACUTE ONLY): 29 min   Charges:   PT Evaluation $PT Eval Moderate Complexity: 1 Mod PT Treatments $Therapeutic Activity: 8-22 mins   PT  G Codes:        Elwyn Reach, PT [phone]   [place] 09/05/2017, 9:32 AM

## 2017-09-05 NOTE — Progress Notes (Signed)
2230: Pt arrived to unit with daughter. Rates back pain as 1/10. [facility] paged; Dr. Annette Stable en route.  2300: Dr. Annette Stable at bedside.  0000: Pt's daughter leaving for the evening. Pt rating pain 6/10.  0100: Pt resting comfortably.  0400: Upon assessment, pt requiring moderate reorienting, and still has no memory of his visit to [facility]. Pt eventually remembers me and speaking with Dr. Annette Stable last night. Pt is understandably bothered by this lapse in memory. I assured him that his daughter was here with him and has updated his family as to his admission. Chart review shows morphine and dilaudid on board during the portion of time that pt is having difficulty recalling. Extensive discussion with pt utilizing therapeutic communication, reorienting, and reassuring. Denies pain.  0500: Pt less anxious  0700: Handoff report given to RN. Please ask family about possible sundowning pta.

## 2017-09-06 MED ORDER — BISACODYL 10 MG RE SUPP
10.0000 mg | Freq: Every day | RECTAL | Status: DC | PRN
  Administered 2017-09-06: 10 mg via RECTAL
  Filled 2017-09-06: qty 1

## 2017-09-06 NOTE — Social Work (Signed)
CSW completed assessment with SNF recommendation. Pt states he would like to return home with [facility].  CSW signing off. Please consult if any additional needs arise.  [place], [place] Work [phone]

## 2017-09-06 NOTE — Clinical Social Work Note (Signed)
Clinical Social Work Assessment  Patient Details  Name: Joe Shah MRN: [id number] Date of Birth: 06/01/1941  Date of referral:  09/06/17               Reason for consult:  Facility Placement, Discharge Planning                Permission sought to share information with:  Case Manager, Family Supports Permission granted to share information::  Yes, Verbal Permission Granted  Name::     [name]/Tracey [place]::     Relationship::  RN Case Manager/daughter   Contact Information:  [phone]  Housing/Transportation Living arrangements for the past 2 months:  [place] of Information:  Patient, Adult Children Patient Interpreter Needed:  None Criminal Activity/Legal Involvement Pertinent to Current Situation/Hospitalization:  No - Comment as needed Significant Relationships:  Adult Children, Spouse, Other Family Members Lives with:  Spouse Do you feel safe going back to the place where you live?  Yes Need for family participation in patient care:  Yes (Comment)  Care giving concerns:  Pt lives at home with wife, pt helps wife with her diabetes management. Pt feels like he is okay to return home but wants to make sure that he is okay in order to keep taking care of his wife.    Social Worker assessment / plan:  CSW spoke with pt and pt daughter as well as another visitor in the room. Pt gave permission for daughter to be included as well as visitor in the conversation. Pt states he lives at home in a single story home "with a walk in shower." Pt has a wife with diabetes who he helps support with care needs. CSW spoke with pt about PT recommendations and pt states "I don't want to leave here till I'm walking and my leg is better." CSW explained SNF process as well as pt right to choose to discharge with HH. Pt states he understands SNF recommendation and would like to return home with his wife and receive in home services as well as continuing to go to the [employer] where  he works out regularly. Pt did state some concern regarding payment and insurance coverage. CSW will f/u with RN Case Manager about desire of pt to return home with [facility]. CSW signing off, please consult if any additional needs arise.   Employment status:  Retired Forensic scientist:  Medicare PT Recommendations:  [place] / Referral to community resources:  Other (Comment Required)  Patient/Family's Response to care:  Pt appeared a bit guarded during initial part of assessment. Pt states that he understands CSW role and SNF recommendation but has concerns about wanting to return home to help wife.   Patient/Family's Understanding of and Emotional Response to Diagnosis, Current Treatment, and Prognosis: Pt was initially guarded and reactive to CSW assessment but at the end softened and thanked CSW with a handshake before CSW left the room. Pt family also was thankful verbally and as evidenced by smiles and head nods during assessment. Pt and pt family state understanding of diagnosis, current treatment and prognosis.   Emotional Assessment Appearance:  Appears stated age Attitude/Demeanor/Rapport:  Guarded, Reactive Affect (typically observed):  Accepting, Guarded Orientation:  Oriented to Self, Oriented to Place, Oriented to  Time, Oriented to Situation Alcohol / Substance use:  Not Applicable Psych involvement (Current and /or in the community):  No (Comment)  Discharge Needs  Concerns to be addressed:  Discharge Planning Concerns, Care  Coordination Readmission within the last 30 days:  No Current discharge risk:  Dependent with Mobility, Physical Impairment Barriers to Discharge:  Continued Medical Work up   [employer], [place] 09/06/2017, 3:51 PM

## 2017-09-06 NOTE — Progress Notes (Signed)
Physical Therapy Treatment Patient Details Name: Joe Shah MRN: [id number] DOB: [birth year] Today's Date: 09/06/2017    History of Present Illness 77 yo with prior L3-4 decompression who was feeling poorly and fell with increased lumbar pain . Noted to have marked disc degeneration with associated spondylosis and lateral listhesis at L3-4 with resultant severe spinal stenosis admitted and treated with IV steroids. PMHx: ADD, AS, bladder CA, HLD, scoliosis, HTN    PT Comments    Pt very pleasant and reports increased function today. Pt premedicated and able to stand, transfer and walk today with pain 5/10 and use of RW. Pt with RLE 3/5 overall with LLE 5/5 with education for HEP but pt reports increased right sided back pain with HEP although left back pain in standing. Educated for HEP as tolerated but not if increasing back pain. Will continue to follow and recommend daily mobility with nursing assist.     Follow Up Recommendations  SNF;Supervision/Assistance - 24 hour(if pt continues to progress at present rate HHPT may be possible)     Equipment Recommendations  None recommended by PT    Recommendations for Other Services       Precautions / Restrictions Precautions Precautions: Fall;Back Precaution Comments: educated for precautions and needs cues to maintain    Mobility  Bed Mobility Overal bed mobility: Needs Assistance Bed Mobility: Rolling;Sidelying to Sit Rolling: Supervision Sidelying to sit: Supervision       General bed mobility comments: cues for sequence to maintain back precautions for decreased back strain  Transfers Overall transfer level: Needs assistance   Transfers: Sit to/from Stand Sit to Stand: Min guard         General transfer comment: cues for hand placement  Ambulation/Gait Ambulation/Gait assistance: Min guard Ambulation Distance (Feet): 170 Feet Assistive device: Rolling walker (2 wheeled) Gait Pattern/deviations: Step-through  pattern;Decreased stride length;Trunk flexed   Gait velocity interpretation: Below normal speed for age/gender General Gait Details: cues for posture and position in RW    Stairs            Wheelchair Mobility    Modified Rankin (Stroke Patients Only)       Balance Overall balance assessment: Needs assistance   Sitting balance-Leahy Scale: Good       Standing balance-Leahy Scale: Fair                              Cognition Arousal/Alertness: Awake/alert Behavior During Therapy: WFL for tasks assessed/performed Overall Cognitive Status: Within Functional Limits for tasks assessed                                        Exercises General Exercises - Lower Extremity Short Arc Quad: AROM;5 reps;Right;Seated    General Comments        Pertinent Vitals/Pain Pain Score: 5  Pain Location: back  Pain Descriptors / Indicators: Aching;Cramping Pain Intervention(s): Limited activity within patient's tolerance;Repositioned;Premedicated before session;Monitored during session    Home Living                      Prior Function            PT Goals (current goals can now be found in the care plan section) Progress towards PT goals: Progressing toward goals    Frequency    Min 3X/week  PT Plan Current plan remains appropriate    Co-evaluation              AM-PAC PT "6 Clicks" Daily Activity  Outcome Measure  Difficulty turning over in bed (including adjusting bedclothes, sheets and blankets)?: A Little Difficulty moving from lying on back to sitting on the side of the bed? : A Little Difficulty sitting down on and standing up from a chair with arms (e.g., wheelchair, bedside commode, etc,.)?: A Little Help needed moving to and from a bed to chair (including a wheelchair)?: A Little Help needed walking in hospital room?: A Little Help needed climbing 3-5 steps with a railing? : A Little 6 Click Score: 18     End of Session Equipment Utilized During Treatment: Gait belt Activity Tolerance: Patient tolerated treatment well Patient left: in chair;with call bell/phone within reach;with chair alarm set Nurse Communication: Mobility status;Precautions PT Visit Diagnosis: Other abnormalities of gait and mobility (R26.89);Muscle weakness (generalized) (M62.81)     Time: 9629-5284 PT Time Calculation (min) (ACUTE ONLY): 18 min  Charges:  $Gait Training: 8-22 mins                    G Codes:       Elwyn Reach, PT [phone]    [place] 09/06/2017, 11:44 AM

## 2017-09-06 NOTE — Progress Notes (Signed)
Patient still with marked mechanical back pain.  No radiating pain.  No new complaints of weakness.  Afebrile.  Vital signs are stable.  Motor and sensory exam remained intact.  Patient with marked disc degeneration and facet arthropathy causing severe spinal stenosis at L3-4.  Patient situation complicated by recent fall with worsening.  I would like to go slow with regard to surgical intervention.  I am hopeful that his symptoms may return to baseline here over the next week or 2.  He may benefit from short-term rehab in a skilled nursing facility.

## 2017-09-07 NOTE — Progress Notes (Signed)
OT Cancellation Note  Patient Details Name: Joe Shah MRN: [id number] DOB: 1941-06-26   Cancelled Treatment:    Reason Eval/Treat Not Completed: Patient declined, no reason specified.  Pt sitting at sink in bathroom performing grooming.  Encouraged him to stand to perform to work on standing tolerance and endurance, but pt adamantly refusing to work with OT at this time.  Will reattempt as able.    Lucille Passy M 09/07/2017, 12:24 PM

## 2017-09-07 NOTE — Care Management Note (Signed)
Case Management Note  Patient Details  Name: Joe Shah MRN: [id number] Date of Birth: [birth year]  Subjective/Objective:      77 yo with prior L3-4 decompression who was feeling poorly and fell with increased lumbar pain . Noted to have marked disc degeneration with associated spondylosis and lateral listhesis at L3-4 with resultant severe spinal stenosis admitted and treated with IV steroids. PTA, pt independent, lives at home with spouse.                Action/Plan: PT/OT recommending SNF, though pt prefers to discharge home with [facility] follow up.  Will continue to monitor progress.    Expected Discharge Date:                  Expected Discharge Plan:  [place]  In-House Referral:  Clinical Social Work  Discharge planning Services  CM Consult  Post Acute Care Choice:    Choice offered to:     DME Arranged:    DME Agency:     HH Arranged:    [place] Agency:     Status of Service:  In process, will continue to follow  If discussed at Long Length of Stay Meetings, dates discussed:    Additional Comments:  Reinaldo Raddle, RN, BSN  Trauma/Neuro ICU Case Manager [phone]

## 2017-09-07 NOTE — Progress Notes (Signed)
The patient is doing significantly better.  He is now ambulating with some assistance.  He is getting up and out of bed and out of a chair on his own.  He is able to void without difficulty.  Neurologic exam is stable.  Chest and abdomen are benign.  I am hopeful the patient will continue to improve.  We will continue steroids overnight and work towards home discharge tomorrow with therapy.

## 2017-09-08 MED ORDER — METHYLPREDNISOLONE 4 MG PO TBPK: ORAL_TABLET | ORAL | 0 refills | Status: AC

## 2017-09-08 MED ORDER — HYDROCODONE-ACETAMINOPHEN 5-325 MG PO TABS: 1.0000 | ORAL_TABLET | ORAL | 0 refills | Status: DC | PRN

## 2017-09-08 NOTE — Discharge Summary (Signed)
Physician Discharge Summary  Patient ID: Joe Shah MRN: [id number] DOB/AGE: 1940/09/13 77 y.o.  Admit date: 09/04/2017 Discharge date: 09/08/2017  Admission Diagnoses:  Discharge Diagnoses:  Active Problems:   Spinal stenosis, lumbar   Cauda equina compression [name]   Discharged Condition: fair  Hospital Course: Patient admitted to the hospital for treatment of severe back pain and urinary retention.  Patient underwent treatment with intravenous steroids and analgesics for treatment of his pain which was recently worsened by a fall.  The patient's pain gradually improved.  He has been able to be mobilized.  His urinary retention has resolved.  He is having no current lower extremity symptoms.  He is comfortable with discharge home with home therapy.  He will return to my office in 2 weeks to assess for need for surgical intervention for his severe lumbar stenosis.  Consults:   Significant Diagnostic Studies:   Treatments:   Discharge Exam: Blood pressure 124/80, pulse (!) 57, temperature 97.7 F (36.5 C), temperature source Oral, resp. rate (!) 21, height 5\' 10"  (1.778 m), weight 81.6 kg (180 lb), SpO2 95 %. Awake and alert.  Oriented and appropriate.  Cranial nerve function intact.  Motor and sensory function extremities stable.  Chest and abdomen benign.  Disposition: 01-Home or Self Care  Discharge Instructions    Face-to-face encounter (required for Medicare/Medicaid patients)   Complete by:  As directed    I Joe Shah certify that this patient is under my care and that I, or a nurse practitioner or physician's assistant working with me, had a face-to-face encounter that meets the physician face-to-face encounter requirements with this patient on 09/08/2017. The encounter with the patient was in whole, or in part for the following medical condition(s) which is the primary reason for home health care (List medical condition): lumbar stenosis   The encounter with the patient  was in whole, or in part, for the following medical condition, which is the primary reason for home health care:  lumbar stenoisis   I certify that, based on my findings, the following services are medically necessary home health services:  Physical therapy   Reason for Medically Necessary Home Health Services:  Therapy- Therapeutic Exercises to Increase Strength and Endurance   My clinical findings support the need for the above services:  Unable to leave home safely without assistance and/or assistive device   Further, I certify that my clinical findings support that this patient is homebound due to:  Ambulates short distances less than 300 feet   Home Health   Complete by:  As directed    To provide the following care/treatments:   PT OT       Allergies as of 09/08/2017   No Known Allergies     Medication List    TAKE these medications   acetaminophen 325 MG tablet Commonly known as:  TYLENOL Take 2 tablets (650 mg total) by mouth every 6 (six) hours as needed.   amphetamine-dextroamphetamine 10 MG tablet Commonly known as:  ADDERALL Take 10 mg by mouth daily as needed.   aspirin 81 MG tablet Take 81 mg by mouth daily.   gabapentin 300 MG capsule Commonly known as:  NEURONTIN Take 300 mg by mouth daily as needed (for pain).   HYDROcodone-acetaminophen 5-325 MG tablet Commonly known as:  NORCO/VICODIN Take 1-2 tablets by mouth every 4 (four) hours as needed. What changed:    how much to take  when to take this   losartan 50 MG  tablet Commonly known as:  COZAAR Take 1 tablet (50 mg total) by mouth daily.   meloxicam 15 MG tablet Commonly known as:  MOBIC Take 15 mg by mouth as needed.   methylPREDNISolone 4 MG Tbpk tablet Commonly known as:  MEDROL DOSEPAK follow package directions   naproxen 375 MG tablet Commonly known as:  NAPROSYN Take 1 tablet (375 mg total) by mouth 2 (two) times daily.   simvastatin 20 MG tablet Commonly known as:  ZOCOR TAKE 1  TABLET DAILY IN THE EVENING   sodium chloride 0.65 % Soln nasal spray Commonly known as:  OCEAN Place 2 sprays into both nostrils as needed for congestion.   tamsulosin 0.4 MG Caps capsule Commonly known as:  FLOMAX Take 1 capsule by mouth daily.      Follow-up Information    Joe Larsson, MD. Schedule an appointment as soon as possible for a visit in 2 week(s).   Specialty:  Neurosurgery Contact information: [address] [place] [address] [place] [address] [phone]           Signed: Charlie Shah 09/08/2017, 9:08 AM

## 2017-09-08 NOTE — Progress Notes (Signed)
Physical Therapy Treatment Patient Details Name: Joe Shah MRN: [id number] DOB: 04-30-1941 Today's Date: 09/08/2017    History of Present Illness 77 yo with prior L3-4 decompression who was feeling poorly and fell with increased lumbar pain . Noted to have marked disc degeneration with associated spondylosis and lateral listhesis at L3-4 with resultant severe spinal stenosis admitted and treated with IV steroids. PMHx: ADD, AS, bladder CA, HLD, scoliosis, HTN    PT Comments    Pt presents with overall decrease in functional mobility secondary to above. Transfers and ambulation with RW supervision for safety. He remained very impulsive throughout session. Pt to benefit from SNF, but he is adamant on returning home. D/c plans changed accordingly. Recommend continued acute PT to maximize safety, functional mobility, and independence prior to d/c home with HHPT.    Follow Up Recommendations  Home health PT;Supervision/Assistance - 24 hour     Equipment Recommendations  None recommended by PT    Recommendations for Other Services       Precautions / Restrictions Precautions Precautions: Fall;Back Restrictions Weight Bearing Restrictions: No    Mobility  Bed Mobility               General bed mobility comments: pt in chair  Transfers Overall transfer level: Needs assistance Equipment used: Rolling walker (2 wheeled) Transfers: Sit to/from Stand(from recliner chair) Sit to Stand: Supervision         General transfer comment: Supervision for safety, VCs for hand placement and scooting forward. rocking for momentum to stand  Ambulation/Gait Ambulation/Gait assistance: Supervision Ambulation Distance (Feet): 300 Feet Assistive device: Rolling walker (2 wheeled) Gait Pattern/deviations: Trunk flexed;Decreased stride length;Step-through pattern Gait velocity: appears to be close to pt's baseline Gait velocity interpretation: Below normal speed for age/gender General  Gait Details: VCs for upright posture and management of RW   Stairs            Wheelchair Mobility    Modified Rankin (Stroke Patients Only)       Balance Overall balance assessment: Needs assistance Sitting-balance support: Feet supported Sitting balance-Leahy Scale: Good       Standing balance-Leahy Scale: Fair Standing balance comment: able to maintian unsupported static standing                            Cognition Arousal/Alertness: Awake/alert Behavior During Therapy: Impulsive Overall Cognitive Status: Impaired/Different from baseline Area of Impairment: Following commands;Safety/judgement                       Following Commands: Follows one step commands inconsistently;Follows multi-step commands inconsistently Safety/Judgement: Decreased awareness of safety Awareness: Emergent   General Comments: pt required multiple cues throughout session to remain on task and follow directions      Exercises      General Comments General comments (skin integrity, edema, etc.): VSS throughout      Pertinent Vitals/Pain Pain Assessment: 0-10 Pain Score: 2  Pain Location: back  Pain Descriptors / Indicators: Aching Pain Intervention(s): Monitored during session;Limited activity within patient's tolerance    Home Living                      Prior Function            PT Goals (current goals can now be found in the care plan section) Acute Rehab PT Goals Patient Stated Goal: to go home and take care of himself  Progress towards PT goals: Progressing toward goals    Frequency    Min 3X/week      PT Plan Discharge plan needs to be updated    Co-evaluation              AM-PAC PT "6 Clicks" Daily Activity  Outcome Measure  Difficulty turning over in bed (including adjusting bedclothes, sheets and blankets)?: A Little Difficulty moving from lying on back to sitting on the side of the bed? : A Little Difficulty  sitting down on and standing up from a chair with arms (e.g., wheelchair, bedside commode, etc,.)?: A Little Help needed moving to and from a bed to chair (including a wheelchair)?: A Little Help needed walking in hospital room?: A Little Help needed climbing 3-5 steps with a railing? : A Little 6 Click Score: 18    End of Session   Activity Tolerance: Patient tolerated treatment well(minor increase in pain with walking (2/10)) Patient left: in chair;with call bell/phone within reach;with family/visitor present Nurse Communication: Mobility status PT Visit Diagnosis: Other abnormalities of gait and mobility (R26.89);Muscle weakness (generalized) (M62.81)     Time: 7591-6384 PT Time Calculation (min) (ACUTE ONLY): 13 min  Charges:  $Gait Training: 8-22 mins                    G Codes:      Vic Ripper, SPT   Vic Ripper 09/08/2017, 11:27 AM

## 2017-09-08 NOTE — Care Management Note (Signed)
Case Management Note  Patient Details  Name: Joe Shah MRN: [id number] Date of Birth: [birth year]  Subjective/Objective:      77 yo with prior L3-4 decompression who was feeling poorly and fell with increased lumbar pain . Noted to have marked disc degeneration with associated spondylosis and lateral listhesis at L3-4 with resultant severe spinal stenosis admitted and treated with IV steroids. PTA, pt independent, lives at home with spouse.                Action/Plan: PT/OT recommending SNF, though pt prefers to discharge home with [facility] follow up.  Will continue to monitor progress.    Expected Discharge Date:  09/08/17               Expected Discharge Plan:  [place]  In-House Referral:  Clinical Social Work  Discharge planning Services  CM Consult  Post Acute Care Choice:  Home Health Choice offered to:  Patient  DME Arranged:    DME Agency:     HH Arranged:  PT, OT HH Agency:  [place]  Status of Service:  Completed, signed off  If discussed at [place] of Stay Meetings, dates discussed:    Additional Comments:  09/08/17 J. Aprille Sawhney, RN, BSN Pt medically stable for discharge home today with daughter.  Pt reluctantly agreeable to [facility] follow up.  Plans to dc to daughter's home until the weekend, then back to his home with wife.   Daughter Tracy's address:  [address] [place], [place] [address], Phone: [phone].  Referral to [facility], per pt choice.  Start of care 24-48h post dc date.  No DME needs, per pt/therapists.    Reinaldo Raddle, RN, BSN  Trauma/Neuro ICU Case Manager [phone]

## 2017-09-10 ENCOUNTER — Encounter (HOSPITAL_COMMUNITY): Payer: Self-pay | Admitting: Emergency Medicine

## 2017-09-10 ENCOUNTER — Emergency Department (HOSPITAL_COMMUNITY)
Admission: EM | Admit: 2017-09-10 | Discharge: 2017-09-10 | Disposition: A | Discharge status: Home / Self Care | Payer: Medicare Other | Attending: Emergency Medicine | Admitting: Emergency Medicine

## 2017-09-10 DIAGNOSIS — Z87891 Personal history of nicotine dependence: Secondary | ICD-10-CM | POA: Insufficient documentation

## 2017-09-10 DIAGNOSIS — M549 Dorsalgia, unspecified: Secondary | ICD-10-CM | POA: Diagnosis present

## 2017-09-10 DIAGNOSIS — M545 Low back pain, unspecified: Secondary | ICD-10-CM

## 2017-09-10 DIAGNOSIS — I10 Essential (primary) hypertension: Secondary | ICD-10-CM | POA: Diagnosis not present

## 2017-09-10 DIAGNOSIS — Z79899 Other long term (current) drug therapy: Secondary | ICD-10-CM | POA: Insufficient documentation

## 2017-09-10 DIAGNOSIS — G8929 Other chronic pain: Secondary | ICD-10-CM

## 2017-09-10 DIAGNOSIS — Z8052 Family history of malignant neoplasm of bladder: Secondary | ICD-10-CM | POA: Insufficient documentation

## 2017-09-10 MED ORDER — DIAZEPAM 5 MG PO TABS
5.0000 mg | ORAL_TABLET | Freq: Once | ORAL | Status: AC
Start: 2017-09-10 — End: 2017-09-10
  Administered 2017-09-10: 5 mg via ORAL
  Filled 2017-09-10: qty 1

## 2017-09-10 MED ORDER — DIAZEPAM 2 MG PO TABS: 2.0000 mg | ORAL_TABLET | Freq: Four times a day (QID) | ORAL | 0 refills | Status: AC | PRN

## 2017-09-10 MED ORDER — KETOROLAC TROMETHAMINE 60 MG/2ML IM SOLN
30.0000 mg | Freq: Once | INTRAMUSCULAR | Status: AC
Start: 2017-09-10 — End: 2017-09-10
  Administered 2017-09-10: 30 mg via INTRAMUSCULAR
  Filled 2017-09-10: qty 2

## 2017-09-10 MED ORDER — HYDROMORPHONE HCL 1 MG/ML IJ SOLN
1.0000 mg | Freq: Once | INTRAMUSCULAR | Status: AC
  Administered 2017-09-10: 1 mg via INTRAMUSCULAR
  Filled 2017-09-10: qty 1

## 2017-09-10 MED ORDER — OXYCODONE-ACETAMINOPHEN 5-325 MG PO TABS: 1.0000 | ORAL_TABLET | ORAL | 0 refills | Status: AC | PRN

## 2017-09-10 NOTE — ED Triage Notes (Signed)
Pt comes to ed, via [facility], medical hx of back pain, heart valve replacement, bladder cancer, HTN, BPH. Pt was just at [facility] last Friday released Wednesday. pts home hydrocodone not working. Alert x4, uses a walker, v/s on arrival 160/80, pulse 88, spo2 94 on room air.  Pain score 10.

## 2017-09-10 NOTE — ED Notes (Signed)
ED Provider at bedside. 

## 2017-09-10 NOTE — ED Notes (Signed)
Bed: BR49 Expected date:  Expected time:  Means of arrival:  Comments: 77 yo male back pain

## 2017-09-10 NOTE — ED Provider Notes (Signed)
[facility] DEPT Provider Note   CSN: [id number] Arrival date & time: 09/10/17  1245     History   Chief Complaint Chief Complaint  Patient presents with  . Back Pain    HPI Joe Shah is a 77 y.o. male.  77 y/o male w/ h/o cauda equina compression presents w/ severe back pain x 2 days Recently d/c from hospital 2 days ago after an admit by n/s Pain is severe and worse w/ any movement It is un-relieved w/ his home hydrocodone No bowel/bladder dysfx No pernium numbness      Past Medical History:  Diagnosis Date  . ADD (attention deficit disorder with hyperactivity)   . Aortic stenosis   . Aortic valve disorders   . Arthritis   . Bruises easily   . Cancer [facility])    bladder cancer  . Head injury    at age 9 from [place]  . Heart murmur   . HOH (hard of hearing)   . Hyperlipidemia    takes Zocor daily  . Microscopic hematuria    managed by medical MD Dr.Cynthia Melina Copa in [place]  . Other and unspecified hyperlipidemia   . Other B-complex deficiencies   . Scoliosis   . Shortness of breath    with exertion   . Unspecified essential hypertension    takes Accupril daily  . Urinary frequency     Patient Active Problem List   Diagnosis Date Noted  . Cauda equina compression ([place]) 09/05/2017  . Spinal stenosis, lumbar 09/04/2017  . CAD (coronary artery disease) 01/11/2012  . Paroxysmal atrial fibrillation ([place]) 09/11/2011  . Aortic valve Replacement 08/18/2011  . Other B-complex deficiencies   . Carotid bruit 07/24/2011  . Essential hypertension   . Aortic valve disorders     Past Surgical History:  Procedure Laterality Date  . AORTIC VALVE REPLACEMENT  08/18/2011   Procedure: AORTIC VALVE REPLACEMENT (AVR);  Surgeon: Grace Isaac, MD;  Location: [place];  Service: Open Heart Surgery;  Laterality: N/A;  On pump.  Marland Kitchen BACK SURGERY  2009  . CARDIAC CATHETERIZATION  2008   moderate AS. Normal coronary arteries except for  mild plaque in LAD  . COLONOSCOPY    . HEMORRHOID SURGERY  30+yrs ago  . KNEE ARTHROSCOPY  15+yrs ago;2012   x 2 left  . THYROIDECTOMY, PARTIAL  20+yrs ago  . TONSILLECTOMY     as a child       Home Medications    Prior to Admission medications   Medication Sig Start Date End Date Taking? Authorizing Provider  amphetamine-dextroamphetamine (ADDERALL) 10 MG tablet Take 10 mg by mouth daily as needed (ADD).  07/29/17  Yes [provider]  aspirin 81 MG tablet Take 81 mg by mouth daily.   Yes [provider]  gabapentin (NEURONTIN) 300 MG capsule Take 300 mg by mouth daily as needed (for pain).  06/25/17  Yes [provider]  HYDROcodone-acetaminophen (NORCO/VICODIN) 5-325 MG tablet Take 1-2 tablets by mouth every 4 (four) hours as needed. Patient taking differently: Take 1-2 tablets by mouth every 4 (four) hours as needed for moderate pain.  09/08/17  Yes Pool, Mallie Mussel, MD  losartan (COZAAR) 50 MG tablet Take 1 tablet (50 mg total) by mouth daily. 03/17/17 09/10/17 Yes Herminio Commons, MD  meloxicam (MOBIC) 15 MG tablet Take 15 mg by mouth as needed for pain.  07/25/13  Yes Chipper Herb, MD  methylPREDNISolone (MEDROL DOSEPAK) 4 MG TBPK  tablet follow package directions 09/08/17  Yes Earnie Larsson, MD  naproxen (NAPROSYN) 375 MG tablet Take 1 tablet (375 mg total) by mouth 2 (two) times daily. 09/02/17  Yes Varney Biles, MD  simvastatin (ZOCOR) 20 MG tablet TAKE 1 TABLET DAILY IN THE EVENING 03/22/17  Yes Herminio Commons, MD  sodium chloride (OCEAN) 0.65 % SOLN nasal spray Place 2 sprays into both nostrils as needed for congestion.   Yes [provider]  tamsulosin (FLOMAX) 0.4 MG CAPS capsule Take 1 capsule by mouth daily. 09/24/15  Yes [provider]  acetaminophen (TYLENOL) 325 MG tablet Take 2 tablets (650 mg total) by mouth every 6 (six) hours as needed. Patient not taking: Reported on 09/04/2017 09/02/17   Varney Biles, MD    Family  History Family History  Problem Relation Age of Onset  . Heart failure Mother        died 43  . Brain cancer Father        died 59  . Anesthesia problems Neg Hx   . Hypotension Neg Hx   . Malignant hyperthermia Neg Hx   . Pseudochol deficiency Neg Hx     Social History Social History   Tobacco Use  . Smoking status: Former Smoker    Packs/day: 0.50    Years: 35.00    Pack years: 17.50    Types: Cigarettes    Start date: 12/29/1956    Last attempt to quit: 08/03/2001    Years since quitting: 16.1  . Smokeless tobacco: Former Systems developer    Types: Snuff    Quit date: 08/17/2011  . Tobacco comment: quit smoking 10+yrs ago;dips snuff  Substance Use Topics  . Alcohol use: Yes    Alcohol/week: 0.6 oz    Types: 1 Glasses of wine per week    Comment: one glass per night  . Drug use: No     Allergies   Patient has no known allergies.   Review of Systems Review of Systems  All other systems reviewed and are negative.    Physical Exam Updated Vital Signs BP 131/83 (BP Location: Left Arm)   Pulse 99   Temp 97.9 F (36.6 C) (Oral)   Resp 18   SpO2 99%   Physical Exam  Constitutional: He is oriented to person, place, and time. He appears well-developed and well-nourished.  Non-toxic appearance. No distress.  HENT:  Head: Normocephalic and atraumatic.  Eyes: Conjunctivae, EOM and lids are normal. Pupils are equal, round, and reactive to light.  Neck: Normal range of motion. Neck supple. No tracheal deviation present. No thyroid mass present.  Cardiovascular: Normal rate, regular rhythm and normal heart sounds. Exam reveals no gallop.  No murmur heard. Pulmonary/Chest: Effort normal and breath sounds normal. No stridor. No respiratory distress. He has no decreased breath sounds. He has no wheezes. He has no rhonchi. He has no rales.  Abdominal: Soft. Normal appearance and bowel sounds are normal. He exhibits no distension. There is no tenderness. There is no rebound and no CVA  tenderness.  Musculoskeletal: Normal range of motion. He exhibits no edema or tenderness.       Back:  Neurological: He is alert and oriented to person, place, and time. He has normal strength. He displays no atrophy and no tremor. No cranial nerve deficit or sensory deficit. GCS eye subscore is 4. GCS verbal subscore is 5. GCS motor subscore is 6.  Reflex Scores:      Patellar reflexes are 1+ on the  right side and 1+ on the left side. Bilateral LE strength 5/5  Skin: Skin is warm and dry. No abrasion and no rash noted.  Psychiatric: He has a normal mood and affect. His speech is normal and behavior is normal.  Nursing note and vitals reviewed.    ED Treatments / Results  Labs (all labs ordered are listed, but only abnormal results are displayed) Labs Reviewed - No data to display  EKG  EKG Interpretation None       Radiology No results found.  Procedures Procedures (including critical care time)  Medications Ordered in ED Medications  HYDROmorphone (DILAUDID) injection 1 mg (not administered)  diazepam (VALIUM) tablet 5 mg (not administered)  ketorolac (TORADOL) injection 30 mg (not administered)     Initial Impression / Assessment and Plan / ED Course  I have reviewed the triage vital signs and the nursing notes.  Pertinent labs & imaging results that were available during my care of the patient were reviewed by me and considered in my medical decision making (see chart for details).     Patient medicated for pain here and feels better.  He has no focal neurological deficits.  He has no signs of cord compression.  Will prescribe opiates and muscle relaxants for his current pain and will follow up with his doctor as needed. Final Clinical Impressions(s) / ED Diagnoses   Final diagnoses:  None    ED Discharge Orders    None       Lacretia Leigh, MD 09/10/17 1011

## 2017-09-10 NOTE — Discharge Instructions (Signed)
Call Dr. Annette Stable today to schedule a follow-up visit

## 2017-09-18 ENCOUNTER — Emergency Department (HOSPITAL_COMMUNITY): Payer: Medicare Other

## 2017-09-18 ENCOUNTER — Inpatient Hospital Stay (HOSPITAL_COMMUNITY): Payer: Medicare Other

## 2017-09-18 ENCOUNTER — Inpatient Hospital Stay (HOSPITAL_COMMUNITY)
Admission: EM | Admit: 2017-09-18 | Discharge: 2017-10-01 | DRG: 064 | Disposition: E | Payer: Medicare Other | Attending: Internal Medicine | Admitting: Internal Medicine

## 2017-09-18 ENCOUNTER — Encounter (HOSPITAL_COMMUNITY): Payer: Self-pay

## 2017-09-18 ENCOUNTER — Other Ambulatory Visit: Payer: Self-pay

## 2017-09-18 DIAGNOSIS — Z79899 Other long term (current) drug therapy: Secondary | ICD-10-CM

## 2017-09-18 DIAGNOSIS — R740 Nonspecific elevation of levels of transaminase and lactic acid dehydrogenase [LDH]: Secondary | ICD-10-CM | POA: Diagnosis not present

## 2017-09-18 DIAGNOSIS — Z808 Family history of malignant neoplasm of other organs or systems: Secondary | ICD-10-CM

## 2017-09-18 DIAGNOSIS — B952 Enterococcus as the cause of diseases classified elsewhere: Secondary | ICD-10-CM | POA: Diagnosis not present

## 2017-09-18 DIAGNOSIS — R7881 Bacteremia: Secondary | ICD-10-CM | POA: Diagnosis present

## 2017-09-18 DIAGNOSIS — N179 Acute kidney failure, unspecified: Secondary | ICD-10-CM | POA: Diagnosis present

## 2017-09-18 DIAGNOSIS — R55 Syncope and collapse: Secondary | ICD-10-CM | POA: Diagnosis present

## 2017-09-18 DIAGNOSIS — I4891 Unspecified atrial fibrillation: Secondary | ICD-10-CM | POA: Diagnosis present

## 2017-09-18 DIAGNOSIS — I63443 Cerebral infarction due to embolism of bilateral cerebellar arteries: Secondary | ICD-10-CM | POA: Diagnosis present

## 2017-09-18 DIAGNOSIS — E890 Postprocedural hypothyroidism: Secondary | ICD-10-CM | POA: Diagnosis present

## 2017-09-18 DIAGNOSIS — R011 Cardiac murmur, unspecified: Secondary | ICD-10-CM | POA: Diagnosis not present

## 2017-09-18 DIAGNOSIS — I76 Septic arterial embolism: Secondary | ICD-10-CM | POA: Diagnosis present

## 2017-09-18 DIAGNOSIS — R471 Dysarthria and anarthria: Secondary | ICD-10-CM | POA: Diagnosis present

## 2017-09-18 DIAGNOSIS — R748 Abnormal levels of other serum enzymes: Secondary | ICD-10-CM

## 2017-09-18 DIAGNOSIS — Z87828 Personal history of other (healed) physical injury and trauma: Secondary | ICD-10-CM

## 2017-09-18 DIAGNOSIS — I639 Cerebral infarction, unspecified: Secondary | ICD-10-CM

## 2017-09-18 DIAGNOSIS — G8191 Hemiplegia, unspecified affecting right dominant side: Secondary | ICD-10-CM | POA: Diagnosis present

## 2017-09-18 DIAGNOSIS — M25512 Pain in left shoulder: Secondary | ICD-10-CM | POA: Diagnosis present

## 2017-09-18 DIAGNOSIS — R29705 NIHSS score 5: Secondary | ICD-10-CM | POA: Diagnosis present

## 2017-09-18 DIAGNOSIS — Z954 Presence of other heart-valve replacement: Secondary | ICD-10-CM | POA: Diagnosis not present

## 2017-09-18 DIAGNOSIS — Z87891 Personal history of nicotine dependence: Secondary | ICD-10-CM

## 2017-09-18 DIAGNOSIS — G834 Cauda equina syndrome: Secondary | ICD-10-CM | POA: Diagnosis present

## 2017-09-18 DIAGNOSIS — I2510 Atherosclerotic heart disease of native coronary artery without angina pectoris: Secondary | ICD-10-CM | POA: Diagnosis present

## 2017-09-18 DIAGNOSIS — Z952 Presence of prosthetic heart valve: Secondary | ICD-10-CM | POA: Diagnosis not present

## 2017-09-18 DIAGNOSIS — M19032 Primary osteoarthritis, left wrist: Secondary | ICD-10-CM | POA: Diagnosis present

## 2017-09-18 DIAGNOSIS — R778 Other specified abnormalities of plasma proteins: Secondary | ICD-10-CM

## 2017-09-18 DIAGNOSIS — I442 Atrioventricular block, complete: Secondary | ICD-10-CM | POA: Diagnosis present

## 2017-09-18 DIAGNOSIS — Z7982 Long term (current) use of aspirin: Secondary | ICD-10-CM

## 2017-09-18 DIAGNOSIS — R7989 Other specified abnormal findings of blood chemistry: Secondary | ICD-10-CM | POA: Diagnosis present

## 2017-09-18 DIAGNOSIS — D72829 Elevated white blood cell count, unspecified: Secondary | ICD-10-CM | POA: Diagnosis not present

## 2017-09-18 DIAGNOSIS — I959 Hypotension, unspecified: Secondary | ICD-10-CM | POA: Diagnosis not present

## 2017-09-18 DIAGNOSIS — Z8249 Family history of ischemic heart disease and other diseases of the circulatory system: Secondary | ICD-10-CM

## 2017-09-18 DIAGNOSIS — G9340 Encephalopathy, unspecified: Secondary | ICD-10-CM | POA: Diagnosis not present

## 2017-09-18 DIAGNOSIS — I480 Paroxysmal atrial fibrillation: Secondary | ICD-10-CM | POA: Diagnosis not present

## 2017-09-18 DIAGNOSIS — T826XXA Infection and inflammatory reaction due to cardiac valve prosthesis, initial encounter: Secondary | ICD-10-CM | POA: Diagnosis present

## 2017-09-18 DIAGNOSIS — Z9181 History of falling: Secondary | ICD-10-CM

## 2017-09-18 DIAGNOSIS — E119 Type 2 diabetes mellitus without complications: Secondary | ICD-10-CM | POA: Diagnosis present

## 2017-09-18 DIAGNOSIS — R000 Tachycardia, unspecified: Secondary | ICD-10-CM | POA: Diagnosis not present

## 2017-09-18 DIAGNOSIS — R0902 Hypoxemia: Secondary | ICD-10-CM | POA: Diagnosis not present

## 2017-09-18 DIAGNOSIS — Z66 Do not resuscitate: Secondary | ICD-10-CM | POA: Diagnosis present

## 2017-09-18 DIAGNOSIS — M419 Scoliosis, unspecified: Secondary | ICD-10-CM | POA: Diagnosis present

## 2017-09-18 DIAGNOSIS — Z1611 Resistance to penicillins: Secondary | ICD-10-CM | POA: Diagnosis not present

## 2017-09-18 DIAGNOSIS — E869 Volume depletion, unspecified: Secondary | ICD-10-CM | POA: Diagnosis present

## 2017-09-18 DIAGNOSIS — N390 Urinary tract infection, site not specified: Secondary | ICD-10-CM | POA: Diagnosis present

## 2017-09-18 DIAGNOSIS — R52 Pain, unspecified: Secondary | ICD-10-CM

## 2017-09-18 DIAGNOSIS — T826XXD Infection and inflammatory reaction due to cardiac valve prosthesis, subsequent encounter: Secondary | ICD-10-CM | POA: Diagnosis not present

## 2017-09-18 DIAGNOSIS — E785 Hyperlipidemia, unspecified: Secondary | ICD-10-CM | POA: Diagnosis present

## 2017-09-18 DIAGNOSIS — R569 Unspecified convulsions: Secondary | ICD-10-CM | POA: Diagnosis present

## 2017-09-18 DIAGNOSIS — M48061 Spinal stenosis, lumbar region without neurogenic claudication: Secondary | ICD-10-CM | POA: Diagnosis present

## 2017-09-18 DIAGNOSIS — Z8551 Personal history of malignant neoplasm of bladder: Secondary | ICD-10-CM

## 2017-09-18 DIAGNOSIS — I350 Nonrheumatic aortic (valve) stenosis: Secondary | ICD-10-CM | POA: Diagnosis not present

## 2017-09-18 DIAGNOSIS — I6300 Cerebral infarction due to thrombosis of unspecified precerebral artery: Secondary | ICD-10-CM | POA: Diagnosis not present

## 2017-09-18 DIAGNOSIS — I6389 Other cerebral infarction: Secondary | ICD-10-CM | POA: Diagnosis not present

## 2017-09-18 DIAGNOSIS — Z791 Long term (current) use of non-steroidal anti-inflammatories (NSAID): Secondary | ICD-10-CM

## 2017-09-18 DIAGNOSIS — Z7952 Long term (current) use of systemic steroids: Secondary | ICD-10-CM

## 2017-09-18 DIAGNOSIS — I330 Acute and subacute infective endocarditis: Secondary | ICD-10-CM | POA: Diagnosis present

## 2017-09-18 DIAGNOSIS — I38 Endocarditis, valve unspecified: Secondary | ICD-10-CM

## 2017-09-18 DIAGNOSIS — I10 Essential (primary) hypertension: Secondary | ICD-10-CM | POA: Diagnosis present

## 2017-09-18 DIAGNOSIS — I6340 Cerebral infarction due to embolism of unspecified cerebral artery: Secondary | ICD-10-CM | POA: Diagnosis present

## 2017-09-18 DIAGNOSIS — M11232 Other chondrocalcinosis, left wrist: Secondary | ICD-10-CM | POA: Diagnosis present

## 2017-09-18 DIAGNOSIS — H9190 Unspecified hearing loss, unspecified ear: Secondary | ICD-10-CM | POA: Diagnosis present

## 2017-09-18 DIAGNOSIS — R0682 Tachypnea, not elsewhere classified: Secondary | ICD-10-CM

## 2017-09-18 DIAGNOSIS — G8311 Monoplegia of lower limb affecting right dominant side: Secondary | ICD-10-CM | POA: Diagnosis not present

## 2017-09-18 DIAGNOSIS — R0981 Nasal congestion: Secondary | ICD-10-CM | POA: Diagnosis not present

## 2017-09-18 HISTORY — DX: Cerebral infarction, unspecified: I63.9

## 2017-09-18 LAB — URINALYSIS, ROUTINE W REFLEX MICROSCOPIC
Bilirubin Urine: NEGATIVE
GLUCOSE, UA: 50 mg/dL — AB
Ketones, ur: NEGATIVE mg/dL
Leukocytes, UA: NEGATIVE
Nitrite: NEGATIVE
PH: 5 (ref 5.0–8.0)
Protein, ur: NEGATIVE mg/dL
SPECIFIC GRAVITY, URINE: 1.018 (ref 1.005–1.030)
SQUAMOUS EPITHELIAL / LPF: NONE SEEN

## 2017-09-18 LAB — CBC WITH DIFFERENTIAL/PLATELET
BASOS ABS: 0 10*3/uL (ref 0.0–0.1)
Basophils Relative: 0 %
EOS PCT: 0 %
Eosinophils Absolute: 0 10*3/uL (ref 0.0–0.7)
HCT: 43.8 % (ref 39.0–52.0)
HEMOGLOBIN: 14.4 g/dL (ref 13.0–17.0)
LYMPHS ABS: 1 10*3/uL (ref 0.7–4.0)
LYMPHS PCT: 5 %
MCH: 26.6 pg (ref 26.0–34.0)
MCHC: 32.9 g/dL (ref 30.0–36.0)
MCV: 81 fL (ref 78.0–100.0)
Monocytes Absolute: 1.7 10*3/uL — ABNORMAL HIGH (ref 0.1–1.0)
Monocytes Relative: 8 %
NEUTROS ABS: 17.2 10*3/uL — AB (ref 1.7–7.7)
NEUTROS PCT: 87 %
PLATELETS: 160 10*3/uL (ref 150–400)
RBC: 5.41 MIL/uL (ref 4.22–5.81)
RDW: 13.8 % (ref 11.5–15.5)
WBC: 19.9 10*3/uL — AB (ref 4.0–10.5)

## 2017-09-18 LAB — COMPREHENSIVE METABOLIC PANEL
ALT: 151 U/L — AB (ref 17–63)
AST: 84 U/L — AB (ref 15–41)
Albumin: 2.5 g/dL — ABNORMAL LOW (ref 3.5–5.0)
Alkaline Phosphatase: 152 U/L — ABNORMAL HIGH (ref 38–126)
Anion gap: 13 (ref 5–15)
BUN: 62 mg/dL — AB (ref 6–20)
CHLORIDE: 97 mmol/L — AB (ref 101–111)
CO2: 26 mmol/L (ref 22–32)
CREATININE: 1.55 mg/dL — AB (ref 0.61–1.24)
Calcium: 9.7 mg/dL (ref 8.9–10.3)
GFR, EST AFRICAN AMERICAN: 48 mL/min — AB (ref >60)
GFR, EST NON AFRICAN AMERICAN: 42 mL/min — AB (ref >60)
Glucose, Bld: 189 mg/dL — ABNORMAL HIGH (ref 65–99)
Potassium: 4.4 mmol/L (ref 3.5–5.1)
SODIUM: 136 mmol/L (ref 135–145)
Total Bilirubin: 1 mg/dL (ref 0.3–1.2)
Total Protein: 6.5 g/dL (ref 6.5–8.1)

## 2017-09-18 LAB — TROPONIN I
Troponin I: 0.05 ng/mL (ref <0.03)
Troponin I: 0.05 ng/mL (ref <0.03)

## 2017-09-18 LAB — I-STAT CG4 LACTIC ACID, ED
LACTIC ACID, VENOUS: 1.6 mmol/L (ref 0.5–1.9)
Lactic Acid, Venous: 2.14 mmol/L (ref 0.5–1.9)

## 2017-09-18 MED ORDER — IBUPROFEN 800 MG PO TABS
800.0000 mg | ORAL_TABLET | Freq: Once | ORAL | Status: AC
  Administered 2017-09-18: 800 mg via ORAL
  Filled 2017-09-18: qty 1

## 2017-09-18 MED ORDER — IOPAMIDOL (ISOVUE-370) INJECTION 76%
65.0000 mL | Freq: Once | INTRAVENOUS | Status: AC | PRN
  Administered 2017-09-18: 18:00:00 via INTRAVENOUS

## 2017-09-18 MED ORDER — SIMVASTATIN 20 MG PO TABS
20.0000 mg | ORAL_TABLET | Freq: Every evening | ORAL | Status: DC
  Administered 2017-09-18 – 2017-09-19 (×2): 20 mg via ORAL
  Filled 2017-09-18: qty 1
  Filled 2017-09-18: qty 2

## 2017-09-18 MED ORDER — GADOBENATE DIMEGLUMINE 529 MG/ML IV SOLN
8.0000 mL | Freq: Once | INTRAVENOUS | Status: AC | PRN
  Administered 2017-09-18: 8 mL via INTRAVENOUS

## 2017-09-18 MED ORDER — SODIUM CHLORIDE 0.9 % IV SOLN
500.0000 mg | Freq: Two times a day (BID) | INTRAVENOUS | Status: DC
  Administered 2017-09-19 – 2017-09-22 (×6): 500 mg via INTRAVENOUS
  Filled 2017-09-18 (×10): qty 5

## 2017-09-18 MED ORDER — LEVETIRACETAM IN NACL 1000 MG/100ML IV SOLN
1000.0000 mg | Freq: Once | INTRAVENOUS | Status: AC
  Administered 2017-09-18: 1000 mg via INTRAVENOUS
  Filled 2017-09-18: qty 100

## 2017-09-18 MED ORDER — SENNOSIDES-DOCUSATE SODIUM 8.6-50 MG PO TABS: 1.0000 | ORAL_TABLET | Freq: Every evening | ORAL | Status: DC | PRN

## 2017-09-18 MED ORDER — SODIUM CHLORIDE 0.9 % IV BOLUS (SEPSIS)
1000.0000 mL | Freq: Once | INTRAVENOUS | Status: AC
  Administered 2017-09-18: 1000 mL via INTRAVENOUS

## 2017-09-18 MED ORDER — STROKE: EARLY STAGES OF RECOVERY BOOK
Freq: Once | Status: DC
  Filled 2017-09-18: qty 1

## 2017-09-18 MED ORDER — ASPIRIN 325 MG PO TABS
325.0000 mg | ORAL_TABLET | Freq: Every day | ORAL | Status: DC
  Administered 2017-09-18 – 2017-09-21 (×4): 325 mg via ORAL
  Filled 2017-09-18 (×4): qty 1

## 2017-09-18 MED ORDER — GABAPENTIN 300 MG PO CAPS: 300.0000 mg | ORAL_CAPSULE | Freq: Every day | ORAL | Status: DC | PRN

## 2017-09-18 MED ORDER — ENOXAPARIN SODIUM 40 MG/0.4ML ~~LOC~~ SOLN
40.0000 mg | SUBCUTANEOUS | Status: DC
  Administered 2017-09-18 – 2017-09-21 (×4): 40 mg via SUBCUTANEOUS
  Filled 2017-09-18 (×4): qty 0.4

## 2017-09-18 MED ORDER — ASPIRIN 300 MG RE SUPP: 300.0000 mg | Freq: Every day | RECTAL | Status: DC

## 2017-09-18 MED ORDER — ACETAMINOPHEN 325 MG PO TABS
650.0000 mg | ORAL_TABLET | Freq: Four times a day (QID) | ORAL | Status: DC | PRN
  Administered 2017-09-18 – 2017-09-21 (×5): 650 mg via ORAL
  Filled 2017-09-18 (×5): qty 2

## 2017-09-18 MED ORDER — TAMSULOSIN HCL 0.4 MG PO CAPS
0.4000 mg | ORAL_CAPSULE | Freq: Every day | ORAL | Status: DC
  Administered 2017-09-19 – 2017-09-21 (×4): 0.4 mg via ORAL
  Filled 2017-09-18 (×3): qty 1

## 2017-09-18 MED ORDER — SODIUM CHLORIDE 0.9 % IV SOLN
75.0000 mL/h | INTRAVENOUS | Status: DC
  Administered 2017-09-18 – 2017-09-21 (×3): 75 mL/h via INTRAVENOUS

## 2017-09-18 NOTE — ED Notes (Signed)
Pt taken to MRI. Nad. No changes

## 2017-09-18 NOTE — ED Notes (Signed)
Pt unable to tolerate standing bp with orthostatic v/s, MD aware.

## 2017-09-18 NOTE — ED Notes (Signed)
CRITICAL VALUE ALERT  Critical Value:  Troponin 0.05  Date & Time Notied:  09/03/2017, 1741  Provider Notified: Dr. Nehemiah Settle   Orders Received/Actions taken: see chart

## 2017-09-18 NOTE — ED Notes (Signed)
Walked and family gave pt some drink. Advised NPO until edp said otherwise

## 2017-09-18 NOTE — ED Triage Notes (Signed)
EMS reports pt fell 2 weeks ago, hurt his back, and was on pain medication.  Reports the pain medication made him too lethargic so he stopped the medicine earlier this week.  Family told ems his mental status improved.  Today pt had a near syncopal episode while trying to get out of bed.   EMS says pt was very diaphoretic but did not lose consciousness.   Reports he vomited last night, none today.  EMS says while on monitor in ambulance, monitor says pt was a stemi.  EMS sent ekg to ER physician at [facility] and was told it was not a stemi.  EMS brought pt to [facility] for evaluation.

## 2017-09-18 NOTE — H&P (Signed)
History and Physical  [name] MWN:[id number] DOB: March 29, 1941 DOA: 09/27/2017  Referring physician: Dr Jeanell Sparrow, ED physician PCP: Octavio Graves, DO  Outpatient Specialists:   Bronson Ing (Cards)  Pool (Neurosurgery)  Patient Coming From: home  Chief Complaint: unresponsive episode  HPI: Joe Shah is a 76 y.o. male with a history of hypertension, hyperlipidemia, aortic stenosis status post porcine valve replacement in 2013 by Dr. 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  Patient was discharged to home on Medrol Dosepak, which he completed on 2/13.  This morning, his son was helping him get dressed, with the patient fell back on the bed became extremely rigid.  This lasted for approximately 45-90 seconds, followed by what appeared to be 5 minutes of confusion and disorientation.  Son reports no generalized shaking or rhythmic movements.  There is no prior history of seizures, head injury, stroke.  There is a question of whether the patient has been having chills over the past couple of days.  Patient reports no body aches, muscle aches.  No palliating or provoking factors.  Patient has had no further episodes.  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  MRI shows scattered small acute infarcts throughout cerebral and cerebellar hemispheres spacious for watershed ischemia the MRI also showed.  The cavernoma.  Troponin slightly elevated at 0.05 white count elevated to 19,000.  Review of Systems:   Pt complains of severe headache yesterday and the day prior.  No headache today.  Pt denies any fevers, nausea, vomiting, diarrhea, constipation, abdominal pain, shortness of breath, dyspnea on exertion, orthopnea, cough, wheezing, palpitations, vision changes,  lightheadedness, dizziness, melena, rectal bleeding.  Review of systems are otherwise negative  Past Medical History:  Diagnosis Date  . ADD (attention deficit disorder with hyperactivity)   . Aortic stenosis   . Aortic valve disorders   . Arthritis   . Bruises easily   . Cancer [facility])    bladder cancer  . Head injury    at age 68 from [place]  . Heart murmur   . HOH (hard of hearing)   . Hyperlipidemia    takes Zocor daily  . Ischemic stroke ([facility]) 09/17/2017  . Microscopic hematuria    managed by medical MD Dr.Cynthia Melina Copa in [place]  . Other and unspecified hyperlipidemia   . Other B-complex deficiencies   . Scoliosis   . Shortness of breath    with exertion   . Unspecified essential hypertension    takes Accupril daily  . Urinary frequency    Past Surgical History:  Procedure Laterality Date  . AORTIC VALVE REPLACEMENT  08/18/2011   Procedure: AORTIC VALVE REPLACEMENT (AVR);  Surgeon: Grace Isaac, MD;  Location: [facility];  Service: Open Heart Surgery;  Laterality: N/A;  On pump.  Mar[facility] Kitchen BACK SURGERY  2009  . CARDIAC CATHETERIZATION  2008   moderate AS. Normal coronary arteries except for mild plaque in LAD  . COLONOSCOPY    . HEMORRHOID SURGERY  30+yrs ago  . KNEE ARTHROSCOPY  15+yrs ago;2012   x 2 left  . THYROIDECTOMY, PARTIAL  20+yrs ago  . TONSILLECTOMY     as a child   Social History:  reports that he quit smoking about 16 years ago. His smoking use included cigarettes. He started smoking about 60 years ago. He has a  17.50 pack-year smoking history. He quit smokeless tobacco use about 6 years ago. His smokeless tobacco use included snuff. He reports that he drinks about 0.6 oz of alcohol per week. He reports that he does not use drugs. Patient lives at home  No Known Allergies  Family History  Problem Relation Age of Onset  . Heart failure Mother        died 60  . Brain cancer Father        died 57  . Anesthesia problems Neg Hx   . Hypotension Neg Hx   .  Malignant hyperthermia Neg Hx   . Pseudochol deficiency Neg Hx       Prior to Admission medications   Medication Sig Start Date End Date Taking? Authorizing Provider  acetaminophen (TYLENOL) 325 MG tablet Take 2 tablets (650 mg total) by mouth every 6 (six) hours as needed. 09/02/17  Yes Varney Biles, MD  aspirin 81 MG tablet Take 81 mg by mouth daily.   Yes [provider]  gabapentin (NEURONTIN) 300 MG capsule Take 300 mg by mouth daily as needed (for pain).  06/25/17  Yes [provider]  losartan (COZAAR) 50 MG tablet Take 1 tablet (50 mg total) by mouth daily. 03/17/17 09/14/2017 Yes Herminio Commons, MD  meloxicam (MOBIC) 15 MG tablet Take 15 mg by mouth as needed for pain.  07/25/13  Yes Chipper Herb, MD  naproxen (NAPROSYN) 375 MG tablet Take 1 tablet (375 mg total) by mouth 2 (two) times daily. 09/02/17  Yes Varney Biles, MD  simvastatin (ZOCOR) 20 MG tablet TAKE 1 TABLET DAILY IN THE EVENING 03/22/17  Yes Herminio Commons, MD  sodium chloride (OCEAN) 0.65 % SOLN nasal spray Place 2 sprays into both nostrils as needed for congestion.   Yes [provider]  tamsulosin (FLOMAX) 0.4 MG CAPS capsule Take 1 capsule by mouth daily. 09/24/15  Yes [provider]  diazepam (VALIUM) 2 MG tablet Take 1 tablet (2 mg total) by mouth every 6 (six) hours as needed for anxiety. 09/10/17   Lacretia Leigh, MD  methylPREDNISolone (MEDROL DOSEPAK) 4 MG TBPK tablet follow package directions 09/08/17   Earnie Larsson, MD  oxyCODONE-acetaminophen (PERCOCET/ROXICET) 5-325 MG tablet Take 1-2 tablets by mouth every 4 (four) hours as needed for severe pain. 09/10/17   Lacretia Leigh, MD    Physical Exam: BP 136/84   Pulse 95   Temp (!) 97.4 F (36.3 C) (Oral)   Resp (!) 21   SpO2 95%   General: Elderly Caucasian male who is hard of hearing. Awake and alert and oriented x3. No acute cardiopulmonary distress.  HEENT: Normocephalic atraumatic.  Right and left ears normal  in appearance.  Pupils equal, round, reactive to light. Extraocular muscles are intact. Sclerae anicteric and noninjected.  Moist mucosal membranes. No mucosal lesions.  Neck: Neck supple without lymphadenopathy. No carotid bruits. No masses palpated.  Cardiovascular: Regular rate with normal S1-S2 sounds. No murmurs, rubs, gallops auscultated. No JVD.  Respiratory: Good respiratory effort with no wheezes, rales, rhonchi. Lungs clear to auscultation bilaterally.  No accessory muscle use. Abdomen: Soft, nontender, nondistended. Active bowel sounds. No masses or hepatosplenomegaly  Skin: No rashes, lesions, or ulcerations.  Dry, warm to touch. 2+ dorsalis pedis and radial pulses. Musculoskeletal: No calf or leg pain. All major joints not erythematous nontender.  No upper or lower joint deformation.  Good ROM.  No contractures  Psychiatric: Intact judgment and insight. Pleasant and cooperative. Neurologic: No focal  neurological deficits. Strength is 5/5 and symmetric in upper and lower extremities.  Coordination intact.  Cranial nerves II through XII are grossly intact.           Labs on Admission: I have personally reviewed following labs and imaging studies  CBC: Recent Labs  Lab 09/25/2017 1102  WBC 19.9*  NEUTROABS 17.2*  HGB 14.4  HCT 43.8  MCV 81.0  PLT 062   Basic Metabolic Panel: Recent Labs  Lab 09/06/2017 1102  NA 136  K 4.4  CL 97*  CO2 26  GLUCOSE 189*  BUN 62*  CREATININE 1.55*  CALCIUM 9.7   GFR: CrCl cannot be calculated (Unknown ideal weight.). Liver Function Tests: Recent Labs  Lab 09/23/2017 1102  AST 84*  ALT 151*  ALKPHOS 152*  BILITOT 1.0  PROT 6.5  ALBUMIN 2.5*   No results for input(s): LIPASE, AMYLASE in the last 168 hours. No results for input(s): AMMONIA in the last 168 hours. Coagulation Profile: No results for input(s): INR, PROTIME in the last 168 hours. Cardiac Enzymes: Recent Labs  Lab 09/21/2017 1102  TROPONINI 0.05*   BNP (last 3  results) No results for input(s): PROBNP in the last 8760 hours. HbA1C: No results for input(s): HGBA1C in the last 72 hours. CBG: No results for input(s): GLUCAP in the last 168 hours. Lipid Profile: No results for input(s): CHOL, HDL, LDLCALC, TRIG, CHOLHDL, LDLDIRECT in the last 72 hours. Thyroid Function Tests: No results for input(s): TSH, T4TOTAL, FREET4, T3FREE, THYROIDAB in the last 72 hours. Anemia Panel: No results for input(s): VITAMINB12, FOLATE, FERRITIN, TIBC, IRON, RETICCTPCT in the last 72 hours. Urine analysis:    Component Value Date/Time   COLORURINE YELLOW 09/12/2017 [place] 09/05/2017 1245   LABSPEC 1.018 09/24/2017 1245   PHURINE 5.0 09/15/2017 1245   GLUCOSEU 50 (A) 09/27/2017 1245   HGBUR SMALL (A) 09/21/2017 1245   BILIRUBINUR NEGATIVE 09/11/2017 1245   KETONESUR NEGATIVE 09/03/2017 1245   PROTEINUR NEGATIVE 09/05/2017 1245   UROBILINOGEN 1.0 08/14/2011 0959   NITRITE NEGATIVE 09/19/2017 1245   LEUKOCYTESUR NEGATIVE 09/28/2017 1245   Sepsis Labs: @LABRCNTIP (procalcitonin:4,lacticidven:4) ) Recent Results (from the past 240 hour(s))  Blood culture (routine x 2)     Status: None (Preliminary result)   Collection Time: 09/15/2017 11:40 AM  Result Value Ref Range Status   Specimen Description   Final    BLOOD LEFT ANTECUBITAL BOTTLES DRAWN AEROBIC AND ANAEROBIC   Special Requests   Final    Blood Culture adequate volume Performed at [facility], [address], [place], [place] [address]    Culture PENDING  Incomplete   Report Status PENDING  Incomplete  Blood culture (routine x 2)     Status: None (Preliminary result)   Collection Time: 09/20/2017 11:49 AM  Result Value Ref Range Status   Specimen Description   Final    BLOOD LEFT HAND BOTTLES DRAWN AEROBIC AND ANAEROBIC   Special Requests   Final    Blood Culture results may not be optimal due to an inadequate volume of blood received in culture bottles Performed at [facility], [address], [place], [place] [address]    Culture PENDING  Incomplete   Report Status PENDING  Incomplete     Radiological Exams on Admission: Ct Head Wo Contrast  Result Date: 09/21/2017 CLINICAL DATA:  Altered level of consciousness. Fall 2 weeks ago. Near syncope today. EXAM: CT HEAD WITHOUT CONTRAST TECHNIQUE: Contiguous axial images were obtained  from the base of the skull through the vertex without intravenous contrast. COMPARISON:  None. FINDINGS: Brain: Mild atrophy. Negative for hydrocephalus. Negative for acute infarct. Mild chronic white matter changes. Subtle hyper density in the right parietal periventricular white matter measuring approximately 7 mm. This does not appear to represent acute hemorrhage but could be subacute hemorrhage or iron deposition from cavernoma which is favored. Hyperdense tumor is also a possibility. Vascular: Atherosclerotic calcification. Negative for acute vascular thrombosis. Skull: Negative Sinuses/Orbits: Negative Other: None IMPRESSION: Mild atrophy and chronic microvascular ischemia in the white matter. No acute infarct. 7 mm subtle hyperdensity in the right parietal periventricular white matter, favor chronic cavernoma. Subacute hemorrhage and hyperdense tumor are possible. Therefore, MRI brain without with contrast recommended for further evaluation. Electronically Signed   By: Franchot Gallo M.D.   On: 09/17/2017 12:02   Ct Lumbar Spine Wo Contrast  Result Date: 09/16/2017 CLINICAL DATA:  Back pain.  Fall 2 weeks ago. EXAM: CT LUMBAR SPINE WITHOUT CONTRAST TECHNIQUE: Multidetector CT imaging of the lumbar spine was performed without intravenous contrast administration. Multiplanar CT image reconstructions were also generated. COMPARISON:  CT lumbar 09/04/2017, MRI lumbar 09/04/2017 FINDINGS: Segmentation: Normal Alignment: Moderate lumbar scoliosis unchanged. 5 mm anterolisthesis L3-4 unchanged. Vertebrae: Negative for fracture or mass Paraspinal and  other soft tissues: Atherosclerotic calcification aorta and iliacs. No retroperitoneal mass. Disc levels: T12-L1: Moderate disc and facet degeneration without significant stenosis L1-2: Moderate disc and facet degeneration. Left foraminal narrowing due to spurring. L2-3: Advanced disc degeneration and spurring on the left causing left foraminal encroachment unchanged. Bilateral facet hypertrophy. Mild narrowing of the canal. Mild right foraminal narrowing. L3-4: 5 mm anterolisthesis with severe facet hypertrophy. Remote laminectomy. Severe facet hypertrophy bilaterally contributing to moderate to severe spinal stenosis. Severe subarticular stenosis bilaterally unchanged. L4-5: Disc degeneration and spurring right greater than left. Severe facet degeneration. Remote laminectomy. Marked bony overgrowth of the facet joints contributing to moderate spinal stenosis. Marked subarticular stenosis bilaterally. L5-S1: Disc degeneration and spurring. Severe facet degeneration. Asymmetric soft tissue in the right foramen suspicious for right foraminal disc protrusion. Correlate with right L5 radicular pain. Severe foraminal encroachment bilaterally due to spurring and disc. IMPRESSION: Advanced multilevel disc and facet degeneration throughout the lumbar spine with moderate scoliosis. Negative for fracture No significant change from prior studies. Electronically Signed   By: Franchot Gallo M.D.   On: 09/15/2017 11:58   Mr Jodene Nam Head Wo Contrast  Result Date: 09/17/2017 CLINICAL DATA:  Near syncopal event today. Fall 2 weeks ago. Orthostatic in emergency department. EXAM: MRI HEAD WITHOUT AND WITH CONTRAST MRA HEAD WITHOUT CONTRAST TECHNIQUE: Multiplanar, multiecho pulse sequences of the brain and surrounding structures were obtained without and with intravenous contrast. Angiographic images of the head were obtained using MRA technique without contrast. CONTRAST:  61mL MULTIHANCE GADOBENATE DIMEGLUMINE 529 MG/ML IV SOLN  COMPARISON:  Head CT 09/14/2017 FINDINGS: MRI HEAD FINDINGS Multiple sequences are mildly to moderately motion degraded. Brain: There are numerous subcentimeter foci of acute infarction scattered throughout both cerebral hemispheres as well as left and possibly right cerebellar hemispheres. The there is cortical involvement in the frontal, parietal, and occipital lobes bilaterally, and there is also involvement of the left centrum semiovale. There is a small chronic infarct in the right cerebellum. Scattered T2 hyperintensities in the cerebral white matter and pons are nonspecific but compatible with mild chronic small vessel ischemic disease. There is mild-to-moderate cerebral atrophy. A 5 mm T2 hyperintense focus with prominent susceptibility artifact in  the right parieto-occipital white matter corresponds to the subtle hyperattenuation on CT and is without associated enhancement or edema. Chronic microhemorrhages are noted in the right cerebellum and right parietal lobe. Vascular: Major intracranial vascular flow voids are preserved. Skull and upper cervical spine: Unremarkable bone marrow signal. Sinuses/Orbits: Unremarkable orbits. Paranasal sinuses and mastoid air cells are clear. Other: None. MRA HEAD FINDINGS There is intermittent moderate motion artifact. The visualized distal vertebral arteries are patent to the basilar with the left being mildly dominant. Patent left PICA and bilateral SCA origins are identified. The basilar artery is widely patent and tortuous. Posterior communicating arteries are not identified. PCAs are patent without evidence of significant proximal stenosis. The internal carotid arteries are patent from skull base to carotid termini without evidence of significant stenosis. ACAs and MCAs are patent without evidence of proximal branch occlusion, A1 stenosis, or M1 stenosis. There is artifactual signal loss in bilateral M2 greater than A2 branches which limits assessment. No aneurysm  is identified. IMPRESSION: 1. 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 2. Subcentimeter focus of chronic hemorrhage in the right parieto-occipital white matter compatible with a cavernoma. 3. Mild chronic small vessel ischemic disease and mild-to-moderate cerebral atrophy. 4. Patent circle of Willis without proximal branch occlusion or flow limiting proximal stenosis. Electronically Signed   By: Logan Bores M.D.   On: 09/14/2017 15:26   Mr Brain W And Wo Contrast  Result Date: 09/13/2017 CLINICAL DATA:  Near syncopal event today. Fall 2 weeks ago. Orthostatic in emergency department. EXAM: MRI HEAD WITHOUT AND WITH CONTRAST MRA HEAD WITHOUT CONTRAST TECHNIQUE: Multiplanar, multiecho pulse sequences of the brain and surrounding structures were obtained without and with intravenous contrast. Angiographic images of the head were obtained using MRA technique without contrast. CONTRAST:  4mL MULTIHANCE GADOBENATE DIMEGLUMINE 529 MG/ML IV SOLN COMPARISON:  Head CT 09/13/2017 FINDINGS: MRI HEAD FINDINGS Multiple sequences are mildly to moderately motion degraded. Brain: There are numerous subcentimeter foci of acute infarction scattered throughout both cerebral hemispheres as well as left and possibly right cerebellar hemispheres. The there is cortical involvement in the frontal, parietal, and occipital lobes bilaterally, and there is also involvement of the left centrum semiovale. There is a small chronic infarct in the right cerebellum. Scattered T2 hyperintensities in the cerebral white matter and pons are nonspecific but compatible with mild chronic small vessel ischemic disease. There is mild-to-moderate cerebral atrophy. A 5 mm T2 hyperintense focus with prominent susceptibility artifact in the right parieto-occipital white matter corresponds to the subtle hyperattenuation on CT and is without associated enhancement or edema. Chronic  microhemorrhages are noted in the right cerebellum and right parietal lobe. Vascular: Major intracranial vascular flow voids are preserved. Skull and upper cervical spine: Unremarkable bone marrow signal. Sinuses/Orbits: Unremarkable orbits. Paranasal sinuses and mastoid air cells are clear. Other: None. MRA HEAD FINDINGS There is intermittent moderate motion artifact. The visualized distal vertebral arteries are patent to the basilar with the left being mildly dominant. Patent left PICA and bilateral SCA origins are identified. The basilar artery is widely patent and tortuous. Posterior communicating arteries are not identified. PCAs are patent without evidence of significant proximal stenosis. The internal carotid arteries are patent from skull base to carotid termini without evidence of significant stenosis. ACAs and MCAs are patent without evidence of proximal branch occlusion, A1 stenosis, or M1 stenosis. There is artifactual signal loss in bilateral M2 greater than A2 branches which limits assessment. No aneurysm is  identified. IMPRESSION: 1. 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 2. Subcentimeter focus of chronic hemorrhage in the right parieto-occipital white matter compatible with a cavernoma. 3. Mild chronic small vessel ischemic disease and mild-to-moderate cerebral atrophy. 4. Patent circle of Willis without proximal branch occlusion or flow limiting proximal stenosis. Electronically Signed   By: Logan Bores M.D.   On: 09/09/2017 15:26   Dg Chest Port 1 View  Result Date: 09/13/2017 CLINICAL DATA:  Fever and leukocytosis. EXAM: PORTABLE CHEST 1 VIEW COMPARISON:  09/04/2017 FINDINGS: 1234 hours. The lungs are clear without focal pneumonia, edema, pneumothorax or pleural effusion. The cardiopericardial silhouette is within normal limits for size. Degenerative changes noted in the shoulders bilaterally status post median  sternotomy. Telemetry leads overlie the chest. IMPRESSION: Low lung volumes without acute findings. Electronically Signed   By: Misty Stanley M.D.   On: 09/03/2017 12:50    EKG: Independently reviewed.  Sinus tachycardia.  Old inferior infarct.  Late R wave progression.  No acute ST elevation or depression, although nonspecific ST changes.  Assessment/Plan: Principal Problem:   Ischemic stroke [facility]) Active Problems:   Essential hypertension   Aortic valve Replacement   Paroxysmal atrial fibrillation (HCC)   CAD (coronary artery disease)   Spinal stenosis, lumbar   Leukocytosis   Elevated troponin   Seizure [facility])    This patient was discussed with the ED physician, including pertinent vitals, physical exam findings, labs, and imaging.  We also discussed care given by the ED provider.  1. Ischemic stroke Observation on telemetry Neurology consulted CTA neck Echocardiogram tomorrow Hemoglobin A1c, lipid panel in the morning PT/OT/ therapy consult Full aspirin Permissive hypertension 2. Seizure 1. Start Keppra: Loading dose and maintenance dose ordered 2. Neurology consulted 3. Likely secondary to ischemic stroke 3. Leukocytosis 1. Question of etiology: Patient was recently on steroids, although he finished the medication 2 days ago.  Additionally, seizures can elevate the patient's white count, although this amount seems more than normal.  With the patient's history of valve replacement, vegetations are possible.  Patient not currently febrile or symptomatic.  Will hold on antibiotics at this point 2. Repeat CBC tomorrow 3. Check echocardiogram 4. Draw blood cultures if the patient develops fever 4. Elevated troponin 1. Repeat troponin 5. Paroxysmal atrial fibrillation 1. Patient had episode of PAF following cardiac surgery with no evidence of recurrence.  Patient not currently having palpitations. 2. The automatic telemetry monitoring signal that the patient was in atrial  fibrillation, however I was able to visualize P waves 3. We will continue telemetry monitoring to evaluate for PAF, which could be a source of shower emboli 4. Echocardiogram tomorrow 6. Aortic valve replacement 1. Porcine. 2. Recheck echo 7. Hypertension 1. Permissive hypertension: Hold antihypertensives 8. Spinal stenosis 1. Stable 2. Neurosurgery to evaluate at the hospital 9. Coronary artery disease 1. Full aspirin 2. Continue Zocor  DVT prophylaxis: Lovenox Consultants: Neurology Code Status: DNR Family Communication: Son, daughter, daughter-in-law present during exam and interview Disposition Plan: Patient should be able to return home following admission   Shelbia Scinto [place] [facility] Pager [phone]  If 7PM-7AM, please contact night-coverage [email or web address] Password [email or web address]

## 2017-09-18 NOTE — ED Notes (Addendum)
Daughter in room and wanted to know what we are doing for him and that if he had a stroke we are already 3 hours out and wanted to talk to dr. Reassured daughter that pt is not presenting with any stroke like sx but  advised her of tests ordered including Head CT.   edp aware daughter wanting to talk with her.

## 2017-09-18 NOTE — ED Notes (Signed)
hospitalist in with pt/family

## 2017-09-18 NOTE — ED Provider Notes (Signed)
[facility] EMERGENCY DEPARTMENT Provider Note   CSN: [id number] Arrival date & time: 09/04/2017  1037     History   Chief Complaint Chief Complaint  Patient presents with  . Loss of Consciousness   Level 5 caveat- patient has poor recall of event History initially from ems who obtained from family HPI Joe Shah is a 78 y.o. male.  HPI  77 y.o. Male ho aortic stenosis s/p repair, hypertension per ems report patient had near syncopal episode.  They report got very lightheaded when he sat up in bed this a.m.  He denies fall or injury.  He did fall several weeks ago and has had back pain worsened since that time. Family reported to ems that he was on pain meds and valium which were stopped earlier thsi week and he has been more responsive since then.   11:21 AM Son present at bedside.  He states that he was helping his father for his pants on today when he became very stiff and laid back in the bed.  His eyes rolled up and lasted approximately 1 minute.  There was then a 5-minute period when he seemed more confused than usual.  His whole body became stiff and there is no generalized shaking.  He reports no prior history of seizures, head injury, or stroke.  Past Medical History:  Diagnosis Date  . ADD (attention deficit disorder with hyperactivity)   . Aortic stenosis   . Aortic valve disorders   . Arthritis   . Bruises easily   . Cancer [facility])    bladder cancer  . Head injury    at age 79 from [place]  . Heart murmur   . HOH (hard of hearing)   . Hyperlipidemia    takes Zocor daily  . Microscopic hematuria    managed by medical MD Dr.Cynthia Melina Copa in [place]  . Other and unspecified hyperlipidemia   . Other B-complex deficiencies   . Scoliosis   . Shortness of breath    with exertion   . Unspecified essential hypertension    takes Accupril daily  . Urinary frequency     Patient Active Problem List   Diagnosis Date Noted  . Cauda equina compression ([place])  09/05/2017  . Spinal stenosis, lumbar 09/04/2017  . CAD (coronary artery disease) 01/11/2012  . Paroxysmal atrial fibrillation ([place]) 09/11/2011  . Aortic valve Replacement 08/18/2011  . Other B-complex deficiencies   . Carotid bruit 07/24/2011  . Essential hypertension   . Aortic valve disorders     Past Surgical History:  Procedure Laterality Date  . AORTIC VALVE REPLACEMENT  08/18/2011   Procedure: AORTIC VALVE REPLACEMENT (AVR);  Surgeon: Grace Isaac, MD;  Location: [facility];  Service: Open Heart Surgery;  Laterality: N/A;  On pump.  Marland Kitchen BACK SURGERY  2009  . CARDIAC CATHETERIZATION  2008   moderate AS. Normal coronary arteries except for mild plaque in LAD  . COLONOSCOPY    . HEMORRHOID SURGERY  30+yrs ago  . KNEE ARTHROSCOPY  15+yrs ago;2012   x 2 left  . THYROIDECTOMY, PARTIAL  20+yrs ago  . TONSILLECTOMY     as a child       Home Medications    Prior to Admission medications   Medication Sig Start Date End Date Taking? Authorizing Provider  acetaminophen (TYLENOL) 325 MG tablet Take 2 tablets (650 mg total) by mouth every 6 (six) hours as needed. Patient not taking: Reported on 09/04/2017 09/02/17   Kathrynn Humble,  Ankit, MD  amphetamine-dextroamphetamine (ADDERALL) 10 MG tablet Take 10 mg by mouth daily as needed (ADD).  07/29/17   [provider]  aspirin 81 MG tablet Take 81 mg by mouth daily.    [provider]  diazepam (VALIUM) 2 MG tablet Take 1 tablet (2 mg total) by mouth every 6 (six) hours as needed for anxiety. 09/10/17   Lacretia Leigh, MD  gabapentin (NEURONTIN) 300 MG capsule Take 300 mg by mouth daily as needed (for pain).  06/25/17   [provider]  losartan (COZAAR) 50 MG tablet Take 1 tablet (50 mg total) by mouth daily. 03/17/17 09/10/17  Herminio Commons, MD  meloxicam (MOBIC) 15 MG tablet Take 15 mg by mouth as needed for pain.  07/25/13   Chipper Herb, MD  methylPREDNISolone (MEDROL DOSEPAK) 4 MG TBPK tablet follow package  directions 09/08/17   Earnie Larsson, MD  naproxen (NAPROSYN) 375 MG tablet Take 1 tablet (375 mg total) by mouth 2 (two) times daily. 09/02/17   Varney Biles, MD  oxyCODONE-acetaminophen (PERCOCET/ROXICET) 5-325 MG tablet Take 1-2 tablets by mouth every 4 (four) hours as needed for severe pain. 09/10/17   Lacretia Leigh, MD  simvastatin (ZOCOR) 20 MG tablet TAKE 1 TABLET DAILY IN THE EVENING 03/22/17   Herminio Commons, MD  sodium chloride (OCEAN) 0.65 % SOLN nasal spray Place 2 sprays into both nostrils as needed for congestion.    [provider]  tamsulosin (FLOMAX) 0.4 MG CAPS capsule Take 1 capsule by mouth daily. 09/24/15   [provider]    Family History Family History  Problem Relation Age of Onset  . Heart failure Mother        died 19  . Brain cancer Father        died 64  . Anesthesia problems Neg Hx   . Hypotension Neg Hx   . Malignant hyperthermia Neg Hx   . Pseudochol deficiency Neg Hx     Social History Social History   Tobacco Use  . Smoking status: Former Smoker    Packs/day: 0.50    Years: 35.00    Pack years: 17.50    Types: Cigarettes    Start date: 12/29/1956    Last attempt to quit: 08/03/2001    Years since quitting: 16.1  . Smokeless tobacco: Former Systems developer    Types: Snuff    Quit date: 08/17/2011  . Tobacco comment: quit smoking 10+yrs ago;dips snuff  Substance Use Topics  . Alcohol use: Yes    Alcohol/week: 0.6 oz    Types: 1 Glasses of wine per week    Comment: one glass per night  . Drug use: No     Allergies   Patient has no known allergies.   Review of Systems Review of Systems  All other systems reviewed and are negative.    Physical Exam Updated Vital Signs BP 112/87 (BP Location: Left Arm)   Pulse (!) 108   Temp 97.6 F (36.4 C) (Oral)   Resp 18   SpO2 97%   Physical Exam  Constitutional: He is oriented to person, place, and time. He appears well-developed and well-nourished.  HENT:  Head: Normocephalic  and atraumatic.  Mm dry  Eyes: Pupils are equal, round, and reactive to light.  Neck: Normal range of motion.  Cardiovascular: Regular rhythm. Tachycardia present.  Pulmonary/Chest: Effort normal.  Abdominal: Soft.  Musculoskeletal: Normal range of motion.  Neurological: He is alert and oriented to person, place, and time.  No cranial nerve deficit. He exhibits normal muscle tone. Coordination normal.  No palmar drift noted Able to raise bilateral legs against gravity to count of 3 Heel to shin equal bilaterally  Skin: Skin is warm. Capillary refill takes less than 2 seconds.  Nursing note and vitals reviewed.    ED Treatments / Results  Labs (all labs ordered are listed, but only abnormal results are displayed) Labs Reviewed - No data to display  EKG  EKG Interpretation  Date/Time:  Saturday September 18 2017 10:40:19 EST Ventricular Rate:  110 PR Interval:    QRS Duration: 95 QT Interval:  303 QTC Calculation: 410 R Axis:   -35 Text Interpretation:  Sinus tachycardia Abnormal R-wave progression, late transition Inferior infarct, old Lateral leads are also involved Non-specific ST-t changes Poor data quality Confirmed by Pattricia Boss [phone]) on 09/24/2017 12:18:24 PM       Radiology Ct Head Wo Contrast  Result Date: 09/11/2017 CLINICAL DATA:  Altered level of consciousness. Fall 2 weeks ago. Near syncope today. EXAM: CT HEAD WITHOUT CONTRAST TECHNIQUE: Contiguous axial images were obtained from the base of the skull through the vertex without intravenous contrast. COMPARISON:  None. FINDINGS: Brain: Mild atrophy. Negative for hydrocephalus. Negative for acute infarct. Mild chronic white matter changes. Subtle hyper density in the right parietal periventricular white matter measuring approximately 7 mm. This does not appear to represent acute hemorrhage but could be subacute hemorrhage or iron deposition from cavernoma which is favored. Hyperdense tumor is also a possibility.  Vascular: Atherosclerotic calcification. Negative for acute vascular thrombosis. Skull: Negative Sinuses/Orbits: Negative Other: None IMPRESSION: Mild atrophy and chronic microvascular ischemia in the white matter. No acute infarct. 7 mm subtle hyperdensity in the right parietal periventricular white matter, favor chronic cavernoma. Subacute hemorrhage and hyperdense tumor are possible. Therefore, MRI brain without with contrast recommended for further evaluation. Electronically Signed   By: Franchot Gallo M.D.   On: 09/06/2017 12:02   Ct Lumbar Spine Wo Contrast  Result Date: 09/14/2017 CLINICAL DATA:  Back pain.  Fall 2 weeks ago. EXAM: CT LUMBAR SPINE WITHOUT CONTRAST TECHNIQUE: Multidetector CT imaging of the lumbar spine was performed without intravenous contrast administration. Multiplanar CT image reconstructions were also generated. COMPARISON:  CT lumbar 09/04/2017, MRI lumbar 09/04/2017 FINDINGS: Segmentation: Normal Alignment: Moderate lumbar scoliosis unchanged. 5 mm anterolisthesis L3-4 unchanged. Vertebrae: Negative for fracture or mass Paraspinal and other soft tissues: Atherosclerotic calcification aorta and iliacs. No retroperitoneal mass. Disc levels: T12-L1: Moderate disc and facet degeneration without significant stenosis L1-2: Moderate disc and facet degeneration. Left foraminal narrowing due to spurring. L2-3: Advanced disc degeneration and spurring on the left causing left foraminal encroachment unchanged. Bilateral facet hypertrophy. Mild narrowing of the canal. Mild right foraminal narrowing. L3-4: 5 mm anterolisthesis with severe facet hypertrophy. Remote laminectomy. Severe facet hypertrophy bilaterally contributing to moderate to severe spinal stenosis. Severe subarticular stenosis bilaterally unchanged. L4-5: Disc degeneration and spurring right greater than left. Severe facet degeneration. Remote laminectomy. Marked bony overgrowth of the facet joints contributing to moderate spinal  stenosis. Marked subarticular stenosis bilaterally. L5-S1: Disc degeneration and spurring. Severe facet degeneration. Asymmetric soft tissue in the right foramen suspicious for right foraminal disc protrusion. Correlate with right L5 radicular pain. Severe foraminal encroachment bilaterally due to spurring and disc. IMPRESSION: Advanced multilevel disc and facet degeneration throughout the lumbar spine with moderate scoliosis. Negative for fracture No significant change from prior studies. Electronically Signed   By: Franchot Gallo M.D.   On: 09/28/2017  11:58   Mr Virgel Paling HC Contrast  Result Date: 09/24/2017 CLINICAL DATA:  Near syncopal event today. Fall 2 weeks ago. Orthostatic in emergency department. EXAM: MRI HEAD WITHOUT AND WITH CONTRAST MRA HEAD WITHOUT CONTRAST TECHNIQUE: Multiplanar, multiecho pulse sequences of the brain and surrounding structures were obtained without and with intravenous contrast. Angiographic images of the head were obtained using MRA technique without contrast. CONTRAST:  47mL MULTIHANCE GADOBENATE DIMEGLUMINE 529 MG/ML IV SOLN COMPARISON:  Head CT 09/05/2017 FINDINGS: MRI HEAD FINDINGS Multiple sequences are mildly to moderately motion degraded. Brain: There are numerous subcentimeter foci of acute infarction scattered throughout both cerebral hemispheres as well as left and possibly right cerebellar hemispheres. The there is cortical involvement in the frontal, parietal, and occipital lobes bilaterally, and there is also involvement of the left centrum semiovale. There is a small chronic infarct in the right cerebellum. Scattered T2 hyperintensities in the cerebral white matter and pons are nonspecific but compatible with mild chronic small vessel ischemic disease. There is mild-to-moderate cerebral atrophy. A 5 mm T2 hyperintense focus with prominent susceptibility artifact in the right parieto-occipital white matter corresponds to the subtle hyperattenuation on CT and is  without associated enhancement or edema. Chronic microhemorrhages are noted in the right cerebellum and right parietal lobe. Vascular: Major intracranial vascular flow voids are preserved. Skull and upper cervical spine: Unremarkable bone marrow signal. Sinuses/Orbits: Unremarkable orbits. Paranasal sinuses and mastoid air cells are clear. Other: None. MRA HEAD FINDINGS There is intermittent moderate motion artifact. The visualized distal vertebral arteries are patent to the basilar with the left being mildly dominant. Patent left PICA and bilateral SCA origins are identified. The basilar artery is widely patent and tortuous. Posterior communicating arteries are not identified. PCAs are patent without evidence of significant proximal stenosis. The internal carotid arteries are patent from skull base to carotid termini without evidence of significant stenosis. ACAs and MCAs are patent without evidence of proximal branch occlusion, A1 stenosis, or M1 stenosis. There is artifactual signal loss in bilateral M2 greater than A2 branches which limits assessment. No aneurysm is identified. IMPRESSION: 1. 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 2. Subcentimeter focus of chronic hemorrhage in the right parieto-occipital white matter compatible with a cavernoma. 3. Mild chronic small vessel ischemic disease and mild-to-moderate cerebral atrophy. 4. Patent circle of Willis without proximal branch occlusion or flow limiting proximal stenosis. Electronically Signed   By: Logan Bores M.D.   On: 09/28/2017 15:26   Mr Brain W And Wo Contrast  Result Date: 09/14/2017 CLINICAL DATA:  Near syncopal event today. Fall 2 weeks ago. Orthostatic in emergency department. EXAM: MRI HEAD WITHOUT AND WITH CONTRAST MRA HEAD WITHOUT CONTRAST TECHNIQUE: Multiplanar, multiecho pulse sequences of the brain and surrounding structures were obtained without and with  intravenous contrast. Angiographic images of the head were obtained using MRA technique without contrast. CONTRAST:  29mL MULTIHANCE GADOBENATE DIMEGLUMINE 529 MG/ML IV SOLN COMPARISON:  Head CT 09/08/2017 FINDINGS: MRI HEAD FINDINGS Multiple sequences are mildly to moderately motion degraded. Brain: There are numerous subcentimeter foci of acute infarction scattered throughout both cerebral hemispheres as well as left and possibly right cerebellar hemispheres. The there is cortical involvement in the frontal, parietal, and occipital lobes bilaterally, and there is also involvement of the left centrum semiovale. There is a small chronic infarct in the right cerebellum. Scattered T2 hyperintensities in the cerebral white matter and pons are nonspecific but compatible with mild chronic small  vessel ischemic disease. There is mild-to-moderate cerebral atrophy. A 5 mm T2 hyperintense focus with prominent susceptibility artifact in the right parieto-occipital white matter corresponds to the subtle hyperattenuation on CT and is without associated enhancement or edema. Chronic microhemorrhages are noted in the right cerebellum and right parietal lobe. Vascular: Major intracranial vascular flow voids are preserved. Skull and upper cervical spine: Unremarkable bone marrow signal. Sinuses/Orbits: Unremarkable orbits. Paranasal sinuses and mastoid air cells are clear. Other: None. MRA HEAD FINDINGS There is intermittent moderate motion artifact. The visualized distal vertebral arteries are patent to the basilar with the left being mildly dominant. Patent left PICA and bilateral SCA origins are identified. The basilar artery is widely patent and tortuous. Posterior communicating arteries are not identified. PCAs are patent without evidence of significant proximal stenosis. The internal carotid arteries are patent from skull base to carotid termini without evidence of significant stenosis. ACAs and MCAs are patent without  evidence of proximal branch occlusion, A1 stenosis, or M1 stenosis. There is artifactual signal loss in bilateral M2 greater than A2 branches which limits assessment. No aneurysm is identified. IMPRESSION: 1. 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 2. Subcentimeter focus of chronic hemorrhage in the right parieto-occipital white matter compatible with a cavernoma. 3. Mild chronic small vessel ischemic disease and mild-to-moderate cerebral atrophy. 4. Patent circle of Willis without proximal branch occlusion or flow limiting proximal stenosis. Electronically Signed   By: Logan Bores M.D.   On: 09/29/2017 15:26   Dg Chest Port 1 View  Result Date: 09/04/2017 CLINICAL DATA:  Fever and leukocytosis. EXAM: PORTABLE CHEST 1 VIEW COMPARISON:  09/04/2017 FINDINGS: 1234 hours. The lungs are clear without focal pneumonia, edema, pneumothorax or pleural effusion. The cardiopericardial silhouette is within normal limits for size. Degenerative changes noted in the shoulders bilaterally status post median sternotomy. Telemetry leads overlie the chest. IMPRESSION: Low lung volumes without acute findings. Electronically Signed   By: Misty Stanley M.D.   On: 09/10/2017 12:50    Procedures Procedures (including critical care time)  Medications Ordered in ED Medications - No data to display   Initial Impression / Assessment and Plan / ED Course  I have reviewed the triage vital signs and the nursing notes.  Pertinent labs & imaging results that were available during my care of the patient were reviewed by me and considered in my medical decision making (see chart for details).     77 year old man with recent admission for equina syndrome presents today with episode of tonic activity, syncope, and generalized increase in weakness.  Symptoms are concerning for seizure type activity but syncope cannot be ruled out. 1 episodic altered  mental status report family concerning for seizure supported by abnormality on MRI and post ictal episode.  However he is also evaluated for syncope and his troponin is noted to be of elevated on initial evaluation.  EKG is significant for tachycardia and patient appears generally volume depleted 2-elevated troponin 3- abnormal ct head-mri/mra performed with results above Neuro paged  4-aki 5- leukocytosis? Source of infection.  No cellulitis or skin infection noted.  CXR clear.   Discussed with DR. Johnson and requests neurosurgery consult.  Discussed with Dr. Ellene Route, on call for neurosurgery and do not think acute neurosurgical intervention or source.  They will see in consult at [facility].  Discussed with Drs. Stinson and  [employer].  Plan keppra as patient with probable new seizure concurrent with insult of stroke. CRITICAL CARE  Performed by: Pattricia Boss Total critical care time: 70 minutes Critical care time was exclusive of separately billable procedures and treating other patients. Critical care was necessary to treat or prevent imminent or life-threatening deterioration. Critical care was time spent personally by me on the following activities: development of treatment plan with patient and/or surrogate as well as nursing, discussions with consultants, evaluation of patient's response to treatment, examination of patient, obtaining history from patient or surrogate, ordering and performing treatments and interventions, ordering and review of laboratory studies, ordering and review of radiographic studies, pulse oximetry and re-evaluation of patient's condition.   Final Clinical Impressions(s) / ED Diagnoses   Final diagnoses:  Syncope, unspecified syncope type  New onset seizure [facility])  Cerebrovascular accident (CVA), unspecified mechanism ([place])  Elevated troponin    ED Discharge Orders    None       Pattricia Boss, MD 09/19/2017 1622

## 2017-09-18 NOTE — ED Notes (Signed)
[facility] at Bedside to transport Pt.

## 2017-09-18 NOTE — ED Notes (Signed)
CRITICAL VALUE ALERT  Critical Value:  Trop 0.05  Date & Time Notied:  09/30/2017, 1151  Provider Notified: Dr. Jeanell Sparrow  Orders Received/Actions taken: No new orders

## 2017-09-18 NOTE — ED Notes (Signed)
EDP in talking with family

## 2017-09-19 ENCOUNTER — Inpatient Hospital Stay (HOSPITAL_COMMUNITY): Payer: Medicare Other

## 2017-09-19 DIAGNOSIS — R0981 Nasal congestion: Secondary | ICD-10-CM

## 2017-09-19 DIAGNOSIS — Z8551 Personal history of malignant neoplasm of bladder: Secondary | ICD-10-CM

## 2017-09-19 DIAGNOSIS — T826XXA Infection and inflammatory reaction due to cardiac valve prosthesis, initial encounter: Secondary | ICD-10-CM

## 2017-09-19 DIAGNOSIS — R569 Unspecified convulsions: Secondary | ICD-10-CM

## 2017-09-19 DIAGNOSIS — R011 Cardiac murmur, unspecified: Secondary | ICD-10-CM

## 2017-09-19 DIAGNOSIS — I6340 Cerebral infarction due to embolism of unspecified cerebral artery: Principal | ICD-10-CM

## 2017-09-19 DIAGNOSIS — R55 Syncope and collapse: Secondary | ICD-10-CM

## 2017-09-19 DIAGNOSIS — I480 Paroxysmal atrial fibrillation: Secondary | ICD-10-CM

## 2017-09-19 DIAGNOSIS — I38 Endocarditis, valve unspecified: Secondary | ICD-10-CM

## 2017-09-19 DIAGNOSIS — Z954 Presence of other heart-valve replacement: Secondary | ICD-10-CM

## 2017-09-19 DIAGNOSIS — Z87891 Personal history of nicotine dependence: Secondary | ICD-10-CM

## 2017-09-19 DIAGNOSIS — I76 Septic arterial embolism: Secondary | ICD-10-CM

## 2017-09-19 DIAGNOSIS — R7881 Bacteremia: Secondary | ICD-10-CM | POA: Diagnosis present

## 2017-09-19 DIAGNOSIS — Z1611 Resistance to penicillins: Secondary | ICD-10-CM

## 2017-09-19 DIAGNOSIS — I10 Essential (primary) hypertension: Secondary | ICD-10-CM

## 2017-09-19 DIAGNOSIS — B952 Enterococcus as the cause of diseases classified elsewhere: Secondary | ICD-10-CM

## 2017-09-19 DIAGNOSIS — G8311 Monoplegia of lower limb affecting right dominant side: Secondary | ICD-10-CM

## 2017-09-19 DIAGNOSIS — M48061 Spinal stenosis, lumbar region without neurogenic claudication: Secondary | ICD-10-CM

## 2017-09-19 LAB — ECHOCARDIOGRAM COMPLETE
AO mean calculated velocity dopler: 106 cm/s
AV Area VTI index: 0.99 cm2/m2
AV Area VTI: 1.74 cm2
AV Area mean vel: 1.75 cm2
AV Mean grad: 5 mmHg
AV Peak grad: 9 mmHg
AV VEL mean LVOT/AV: 0.62
AV area mean vel ind: 0.9 cm2/m2
AV peak Index: 0.89
AV pk vel: 149 cm/s
AV vel: 1.93
Ao pk vel: 0.61 m/s
E/e' ratio: 4.29
FS: 44 % (ref 28–44)
Height: 72 in
IVS/LV PW RATIO, ED: 0.91
LA ID, A-P, ES: 38 mm
LA diam end sys: 38 mm
LA diam index: 1.94 cm/m2
LA vol A4C: 55.7 ml
LA vol index: 29.6 mL/m2
LA vol: 57.9 mL
LV E/e' medial: 4.29
LV E/e'average: 4.29
LV PW d: 11 mm — AB (ref 0.6–1.1)
LV e' LATERAL: 14.8 cm/s
LVOT MV VTI INDEX: 0.66 cm2/m2
LVOT MV VTI: 1.29
LVOT SV: 39 mL
LVOT VTI: 13.7 cm
LVOT area: 2.84 cm2
LVOT diameter: 19 mm
LVOT peak VTI: 0.68 cm
LVOT peak vel: 91.3 cm/s
MV Annulus VTI: 30.1 cm
MV M vel: 94.9
MV pk A vel: 135 m/s
MV pk E vel: 63.5 m/s
Mean grad: 4 mmHg
TDI e' lateral: 14.8
TDI e' medial: 4.68
VTI: 20.2 cm
Valve area index: 0.99
Valve area: 1.93 cm2
Weight: 2656.1 oz

## 2017-09-19 LAB — BASIC METABOLIC PANEL
Anion gap: 11 (ref 5–15)
BUN: 38 mg/dL — AB (ref 6–20)
CHLORIDE: 104 mmol/L (ref 101–111)
CO2: 21 mmol/L — AB (ref 22–32)
CREATININE: 1.17 mg/dL (ref 0.61–1.24)
Calcium: 8.6 mg/dL — ABNORMAL LOW (ref 8.9–10.3)
GFR calc Af Amer: >60 mL/min (ref >60)
GFR calc non Af Amer: 59 mL/min — ABNORMAL LOW (ref >60)
GLUCOSE: 185 mg/dL — AB (ref 65–99)
Potassium: 4 mmol/L (ref 3.5–5.1)
SODIUM: 136 mmol/L (ref 135–145)

## 2017-09-19 LAB — CBC
HEMATOCRIT: 36 % — AB (ref 39.0–52.0)
Hemoglobin: 11.9 g/dL — ABNORMAL LOW (ref 13.0–17.0)
MCH: 26.9 pg (ref 26.0–34.0)
MCHC: 33.1 g/dL (ref 30.0–36.0)
MCV: 81.4 fL (ref 78.0–100.0)
PLATELETS: 180 10*3/uL (ref 150–400)
RBC: 4.42 MIL/uL (ref 4.22–5.81)
RDW: 14 % (ref 11.5–15.5)
WBC: 23.3 10*3/uL — ABNORMAL HIGH (ref 4.0–10.5)

## 2017-09-19 LAB — BLOOD CULTURE ID PANEL (REFLEXED)
Acinetobacter baumannii: NOT DETECTED
Candida albicans: NOT DETECTED
Candida glabrata: NOT DETECTED
Candida krusei: NOT DETECTED
Candida parapsilosis: NOT DETECTED
Candida tropicalis: NOT DETECTED
ENTEROBACTER CLOACAE COMPLEX: NOT DETECTED
ENTEROBACTERIACEAE SPECIES: NOT DETECTED
ENTEROCOCCUS SPECIES: DETECTED — AB
Escherichia coli: NOT DETECTED
HAEMOPHILUS INFLUENZAE: NOT DETECTED
Klebsiella oxytoca: NOT DETECTED
Klebsiella pneumoniae: NOT DETECTED
LISTERIA MONOCYTOGENES: NOT DETECTED
NEISSERIA MENINGITIDIS: NOT DETECTED
PROTEUS SPECIES: NOT DETECTED
Pseudomonas aeruginosa: NOT DETECTED
STAPHYLOCOCCUS AUREUS BCID: NOT DETECTED
STREPTOCOCCUS AGALACTIAE: NOT DETECTED
STREPTOCOCCUS SPECIES: NOT DETECTED
Serratia marcescens: NOT DETECTED
Staphylococcus species: NOT DETECTED
Streptococcus pneumoniae: NOT DETECTED
Streptococcus pyogenes: NOT DETECTED
VANCOMYCIN RESISTANCE: NOT DETECTED

## 2017-09-19 MED ORDER — SODIUM CHLORIDE 0.9 % IV SOLN
2.0000 g | Freq: Four times a day (QID) | INTRAVENOUS | Status: DC
  Administered 2017-09-19 – 2017-09-20 (×4): 2 g via INTRAVENOUS
  Filled 2017-09-19 (×5): qty 2000

## 2017-09-19 MED ORDER — SODIUM CHLORIDE 0.9 % IV SOLN
600.0000 mg | INTRAVENOUS | Status: DC
  Administered 2017-09-19: 600 mg via INTRAVENOUS
  Filled 2017-09-19 (×2): qty 12

## 2017-09-19 NOTE — Progress Notes (Signed)
PHARMACY - PHYSICIAN COMMUNICATION CRITICAL VALUE ALERT - BLOOD CULTURE IDENTIFICATION (BCID)  Joe Shah is an 76 y.o. male who presented to [facility] on 09/04/2017 with a chief complaint of AMS  Assessment:  Blood cultures growing Entercoccus  Name of physician (or Provider) Contacted:  Dr. Tana Coast  Current antibiotics:  None  Changes to prescribed antibiotics recommended:  Start Ampicillin 2 g IV q6h (adjusted for renal function)  Results for orders placed or performed during the hospital encounter of 09/16/2017  Blood Culture ID Panel (Reflexed) (Collected: 09/12/2017 11:40 AM)  Result Value Ref Range   Enterococcus species DETECTED (A) NOT DETECTED   Vancomycin resistance NOT DETECTED NOT DETECTED   Listeria monocytogenes NOT DETECTED NOT DETECTED   Staphylococcus species NOT DETECTED NOT DETECTED   Staphylococcus aureus NOT DETECTED NOT DETECTED   Streptococcus species NOT DETECTED NOT DETECTED   Streptococcus agalactiae NOT DETECTED NOT DETECTED   Streptococcus pneumoniae NOT DETECTED NOT DETECTED   Streptococcus pyogenes NOT DETECTED NOT DETECTED   Acinetobacter baumannii NOT DETECTED NOT DETECTED   Enterobacteriaceae species NOT DETECTED NOT DETECTED   Enterobacter cloacae complex NOT DETECTED NOT DETECTED   Escherichia coli NOT DETECTED NOT DETECTED   Klebsiella oxytoca NOT DETECTED NOT DETECTED   Klebsiella pneumoniae NOT DETECTED NOT DETECTED   Proteus species NOT DETECTED NOT DETECTED   Serratia marcescens NOT DETECTED NOT DETECTED   Haemophilus influenzae NOT DETECTED NOT DETECTED   Neisseria meningitidis NOT DETECTED NOT DETECTED   Pseudomonas aeruginosa NOT DETECTED NOT DETECTED   Candida albicans NOT DETECTED NOT DETECTED   Candida glabrata NOT DETECTED NOT DETECTED   Candida krusei NOT DETECTED NOT DETECTED   Candida parapsilosis NOT DETECTED NOT DETECTED   Candida tropicalis NOT DETECTED NOT DETECTED    Caryl Pina 09/19/2017  8:12 AM

## 2017-09-19 NOTE — H&P (View-Only) (Signed)
[facility]                                                                              Patient Demographics  Joe Shah, is a 77 y.o. male, DOB - 11-Jan-1941, WCH:[id number]  Admit date - 09/24/2017   Admitting Physician Truett Mainland, DO  Outpatient Primary MD for the patient is Octavio Graves, DO  Outpatient specialists:   LOS - 1  days   Medical records reviewed and are as summarized below:    Chief Complaint  Patient presents with  . Loss of Consciousness       Brief summary   Admit note by Dr. Nehemiah Settle on 2/16:   Joe Shah is a 77 y.o. male with a history of htn, hlp, AS s/p porcine valve replacement in 2013 by Dr. 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  Patient was discharged to home on Medrol Dosepak, which he completed on 2/13 and supposed to follow with Dr. Annette Stable neurosurgery in 2 weeks.   On the morning of admission 09/12/2017, his son was helping him get dressed, with the patient fell back on the bed became extremely rigid, lasted for ~ 45-90 seconds, followed by 5 minutes of confusion and disorientation.  Son reported no generalized shaking or rhythmic movements.  There is no prior history of seizures, head injury, stroke. There was a concern whether the patient has been having chills over the past couple of days.     Assessment & Plan    Principal Problem: Acute embolic stroke - Presented with confusion, right arm weakness following the syncopal versus seizure episode - MRI brain showed multiple bilateral small infarcts in the cerebellar and cerebellum region, predominantly in the watershed territory, could be an cardio embolic given paroxysmal atrial fibrillation,  enterococcus bacteremia - MRA showed patent circle of Willis without proximal brought occlusion of flow  limiting proximal stenosis - Neurology consulted, recommended aspirin 325 mg daily and consider starting anticoagulation, will follow stroke team recommendations - Permissive hypertension, Lipitor 80 mg daily - Follow hemoglobin A1c, lipid profile - Follow 2-D echo, carotid Dopplers - Discussed with Dr Tommy Medal given enterococcus bacteremia, will need TEE, consulted with Dr. Caryl Comes for TEE procedure    Active Problems:   Bacteremia due to enterococcus - Blood cultures positive for enterococcus, sensitivities pending - Unclear source, patient has porcine aortic valve, replacement in 2013 for  - Recent admission for acute back pain, lumbar stenosis 2/2-2/6, reviewed discharge summary by Dr. Annette Stable, does not appear to have any interventions done, were discharged on pain medications and Medrol Dosepak - UA negative for UTI, chest x-ray negative for pneumonia - May need CT abdomen for further workup however no complaints of abdominal pain. Called cardiology for TEE. - ID, Dr. Drucilla Schmidt consulted, started on IV ampicillin, follow recommendations     Mildly elevated troponin - No complaints of chest pain, troponins 2 flat 0.05, likely due to acute stroke and enterococcus bacteremia - Follow 2-D echo  Seizure - Per neurology,  likely syncope however could have a seizure in the setting of embolic infarcts - Recommended EEG, no AEDs at this time     Essential hypertension - BP currently stable, hold antihypertensive    Aortic valve Replacement with porcine valve - TEE as soon as possible to rule out endocarditis    Paroxysmal atrial fibrillation (HCC) - Currently rate controlled, patient was not on any anticoagulation due to paroxysmal atrial fibrillation after the aortic valve surgery, no further recurrence    CAD (coronary artery disease) - Currently no chest pain, continue aspirin, statin, will follow 2-D echo    Spinal stenosis, lumbar status post recent hospitalization 2/2-2/6 - Denies  any significant worsening pain, completed Medrol Dosepak   Code Status: DO NOT RESUSCITATE DVT Prophylaxis:  Lovenox  Family Communication: Discussed in detail with the patient, all imaging results, lab results explained to the patient    Disposition Plan:  Time Spent in minutes 35 minutes  Procedures:  MRI brain   Consultants:   Neurology   infectious disease Cardiology for TEE  Antimicrobials:   Ampicillin 2/17   Medications  Scheduled Meds: .  stroke: mapping our early stages of recovery book   Does not apply Once  . aspirin  300 mg Rectal Daily   Or  . aspirin  325 mg Oral Daily  . enoxaparin (LOVENOX) injection  40 mg Subcutaneous Q24H  . simvastatin  20 mg Oral QPM  . tamsulosin  0.4 mg Oral Daily   Continuous Infusions: . sodium chloride 75 mL/hr (09/16/2017 1818)  . ampicillin (OMNIPEN) IV Stopped (09/19/17 1005)  . levETIRAcetam     PRN Meds:.acetaminophen, gabapentin, senna-docusate   Antibiotics   Anti-infectives (From admission, onward)   Start     Dose/Rate Route Frequency Ordered Stop   09/19/17 0900  ampicillin (OMNIPEN) 2 g in sodium chloride 0.9 % 100 mL IVPB     2 g 300 mL/hr over 20 Minutes Intravenous Every 6 hours 09/19/17 3976          Subjective:   Joe Shah was seen and examined today.  Somewhat confused, low-grade temp 99.3F. Denies any specific complaints. No seizures this morning.  Patient denies dizziness, chest pain, shortness of breath, abdominal pain, N/V/D/C.  Objective:   Vitals:   09/28/2017 2200 09/19/17 0130 09/19/17 0513 09/19/17 0908  BP: 112/71 122/73 114/73 109/72  Pulse: 94 (!) 114 (!) 103 99  Resp: 20 18 18 18   Temp: 98.1 F (36.7 C) 97.9 F (36.6 C) 99.9 F (37.7 C) 98.5 F (36.9 C)  TempSrc: Oral Oral Axillary Oral  SpO2: 96% 99% 95% 95%  Weight:      Height:        Intake/Output Summary (Last 24 hours) at 09/19/2017 1033 Last data filed at 09/19/2017 0148 Gross per 24 hour  Intake 2782.5 ml    Output 350 ml  Net 2432.5 ml     Wt Readings from Last 3 Encounters:  09/25/2017 75.3 kg (166 lb 0.1 oz)  09/04/17 81.6 kg (180 lb)  09/02/17 81.6 kg (180 lb)     Exam  General: Alert and oriented x self and place, NAD, no dysarthria  Eyes:   HEENT:  Atraumatic, normocephalic  Cardiovascular: S1 S2 auscultated, no rubs, murmurs or gallops. Regular rate and rhythm.  Respiratory: Clear to auscultation bilaterally, no wheezing, rales or rhonchi  Gastrointestinal: Soft, nontender, nondistended, + bowel sounds  Ext: no pedal edema bilaterally  Neuro: strength fairly 5/5 in all extremities  Musculoskeletal: No digital cyanosis, clubbing  Skin: No rashes  Psych: somewhat confused, oriented to self and place   Data Reviewed:  I have personally reviewed following labs and imaging studies  Micro Results Recent Results (from the past 240 hour(s))  Blood culture (routine x 2)     Status: None (Preliminary result)   Collection Time: 09/05/2017 11:40 AM  Result Value Ref Range Status   Specimen Description   Final    BLOOD LEFT ANTECUBITAL BOTTLES DRAWN AEROBIC AND ANAEROBIC Performed at [facility], [address] [place], [place] [address]    Special Requests   Final    Blood Culture adequate volume Performed at [facility], [address] [place], [place] [address]    Culture  Setup Time   Final    GRAM POSITIVE COCCI Gram Stain Report Called to,Read Back By and Verified With: LURZ,G @ 0248 ON 09/19/17 BY JUW GS DONE @ [facility] GRAM STAIN REVIEWED-AGREE WITH RESULT 0623 09/19/17 M.CAMPBELL CRITICAL RESULT CALLED TO, READ BACK BY AND VERIFIED WITH: G. ABBOTT, RPHARMD AT 0810 ON 09/19/17 BY C. JESSUP, MLT. Performed at [facility], [place] [address], [place], [place] [address]    Culture GRAM POSITIVE COCCI  Final   Report Status PENDING  Incomplete  Blood Culture ID Panel (Reflexed)     Status: Abnormal   Collection Time: 09/30/2017 11:40 AM  Result Value Ref Range  Status   Enterococcus species DETECTED (A) NOT DETECTED Final    Comment: CRITICAL RESULT CALLED TO, READ BACK BY AND VERIFIED WITH: G. ABBOTT, RPHARMD AT 0810 ON 09/19/17 BY C. JESSUP, MLT.    Vancomycin resistance NOT DETECTED NOT DETECTED Final   Listeria monocytogenes NOT DETECTED NOT DETECTED Final   Staphylococcus species NOT DETECTED NOT DETECTED Final   Staphylococcus aureus NOT DETECTED NOT DETECTED Final   Streptococcus species NOT DETECTED NOT DETECTED Final   Streptococcus agalactiae NOT DETECTED NOT DETECTED Final   Streptococcus pneumoniae NOT DETECTED NOT DETECTED Final   Streptococcus pyogenes NOT DETECTED NOT DETECTED Final   Acinetobacter baumannii NOT DETECTED NOT DETECTED Final   Enterobacteriaceae species NOT DETECTED NOT DETECTED Final   Enterobacter cloacae complex NOT DETECTED NOT DETECTED Final   Escherichia coli NOT DETECTED NOT DETECTED Final   Klebsiella oxytoca NOT DETECTED NOT DETECTED Final   Klebsiella pneumoniae NOT DETECTED NOT DETECTED Final   Proteus species NOT DETECTED NOT DETECTED Final   Serratia marcescens NOT DETECTED NOT DETECTED Final   Haemophilus influenzae NOT DETECTED NOT DETECTED Final   Neisseria meningitidis NOT DETECTED NOT DETECTED Final   Pseudomonas aeruginosa NOT DETECTED NOT DETECTED Final   Candida albicans NOT DETECTED NOT DETECTED Final   Candida glabrata NOT DETECTED NOT DETECTED Final   Candida krusei NOT DETECTED NOT DETECTED Final   Candida parapsilosis NOT DETECTED NOT DETECTED Final   Candida tropicalis NOT DETECTED NOT DETECTED Final    Comment: Performed at [facility], [place] [address], [place], [place] [address]  Blood culture (routine x 2)     Status: None (Preliminary result)   Collection Time: 09/06/2017 11:49 AM  Result Value Ref Range Status   Specimen Description   Final    BLOOD LEFT HAND BOTTLES DRAWN AEROBIC AND ANAEROBIC Performed at [facility], [address], [place], [place] [address]    Special  Requests   Final    Blood Culture results may not be optimal due to an inadequate volume of blood received in culture bottles  Performed at [facility], [address] [place], [place] [address]    Culture  Setup Time   Final    GRAM POSITIVE COCCI Gram Stain Report Called to,Read Back By and Verified With: LURZ,G @ 6213 ON 09/19/17 BY JUW GS DONE @ [facility] GRAM STAIN [place] WITH RESULT 0619 09/19/17 M.CAMPBELL Performed at [facility], [place] [address] [place], [place] [address]    Culture   Final    NO GROWTH < 24 HOURS Performed at [facility] [address], [place], [place] [address]    Report Status PENDING  Incomplete    Radiology Reports Dg Chest 2 View  Result Date: 09/04/2017 CLINICAL DATA:  Cough, fatigue EXAM: CHEST  2 VIEW COMPARISON:  09/10/2011 FINDINGS: There are low lung volumes. There is no focal parenchymal opacity. There is no pleural effusion or pneumothorax. The heart and mediastinal contours are unremarkable. 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 IMPRESSION: No active cardiopulmonary disease. Electronically Signed   By: Kathreen Devoid   On: 09/04/2017 17:03   Dg Lumbar Spine Complete  Result Date: 09/02/2017 CLINICAL DATA:  Fall this morning with bilateral leg weakness and low back pain. EXAM: LUMBAR SPINE - COMPLETE 4+ VIEW COMPARISON:  KUB 01/28/2016 and lumbar spine 11/29/2009 FINDINGS: Moderate curvature of the lower thoracic/lumbar spine convex right without significant change. Moderate spondylosis throughout the lumbar spine. Moderate disc disease at all levels of the lumbar spine without significant change. Grade 2 anterolisthesis of L3 on L4 with interval worsening. This is likely due to the severe facet arthropathy over the mid to lower lumbar spine. No definite compression fracture. Moderate calcified plaque over the abdominal aorta. IMPRESSION: Moderate spondylosis of the lumbar spine with  moderate multilevel disc disease throughout the lumbar spine. Interval progression of grade 2 anterolisthesis of L3 on L4 likely due to severe facet arthropathy. Electronically Signed   By: Marin Olp M.D.   On: 09/02/2017 21:06   Ct Head Wo Contrast  Result Date: 09/27/2017 CLINICAL DATA:  Altered level of consciousness. Fall 2 weeks ago. Near syncope today. EXAM: CT HEAD WITHOUT CONTRAST TECHNIQUE: Contiguous axial images were obtained from the base of the skull through the vertex without intravenous contrast. COMPARISON:  None. FINDINGS: Brain: Mild atrophy. Negative for hydrocephalus. Negative for acute infarct. Mild chronic white matter changes. Subtle hyper density in the right parietal periventricular white matter measuring approximately 7 mm. This does not appear to represent acute hemorrhage but could be subacute hemorrhage or iron deposition from cavernoma which is favored. Hyperdense tumor is also a possibility. Vascular: Atherosclerotic calcification. Negative for acute vascular thrombosis. Skull: Negative Sinuses/Orbits: Negative Other: None IMPRESSION: Mild atrophy and chronic microvascular ischemia in the white matter. No acute infarct. 7 mm subtle hyperdensity in the right parietal periventricular white matter, favor chronic cavernoma. Subacute hemorrhage and hyperdense tumor are possible. Therefore, MRI brain without with contrast recommended for further evaluation. Electronically Signed   By: Franchot Gallo M.D.   On: 09/24/2017 12:02   Ct Angio Neck W Or Wo Contrast  Result Date: 09/27/2017 CLINICAL DATA:  Syncopal/near syncopal event versus seizure. Scattered bilateral cerebral and cerebellar infarcts on MRI. EXAM: CT ANGIOGRAPHY NECK TECHNIQUE: Multidetector CT imaging of the neck was performed using the standard protocol during bolus administration of intravenous contrast. Multiplanar CT image reconstructions and MIPs were obtained to evaluate the vascular anatomy. Carotid stenosis  measurements (when applicable) are obtained utilizing NASCET criteria, using the  distal internal carotid diameter as the denominator. CONTRAST:  65 mL Isovue 370 COMPARISON:  None. FINDINGS: Aortic arch: Standard 3 vessel aortic arch with mild atherosclerotic plaque. Widely patent brachiocephalic and subclavian arteries with scattered non stenotic plaque. Right carotid system: Patent without evidence of stenosis or dissection. Tortuous mid to distal cervical ICA. Left carotid system: Patent without evidence of significant stenosis or dissection. Mild-to-moderate predominantly calcified plaque in the proximal ICA. Tortuous distal cervical ICA. Vertebral arteries: Patent without evidence of significant stenosis or dissection. Mildly dominant left vertebral artery. Nonstenotic calcified plaque at the right vertebral artery origin. Skeleton: Severe right glenohumeral arthropathy. Severe diffuse cervical disc and right-sided facet degeneration. Other neck: Prior right thyroidectomy. Enlarged and heterogeneous left thyroid lobe with an underlying 4.5 cm mass which laterally deviates the common carotid artery and internal jugular vein and slightly deviates the trachea. Upper chest: Mild centrilobular and paraseptal emphysema. IMPRESSION: 1. Patent cervical carotid and vertebral arteries without stenosis or dissection. Nonstenotic plaque in the left ICA. 2.  Aortic Atherosclerosis (ICD10-I70.0). 3. Prior right thyroidectomy. 4.5 cm left thyroid mass. Recommend correlation with presumed prior outside thyroid imaging. Electronically Signed   By: Logan Bores M.D.   On: 09/05/2017 18:18   Ct Lumbar Spine Wo Contrast  Result Date: 09/09/2017 CLINICAL DATA:  Back pain.  Fall 2 weeks ago. EXAM: CT LUMBAR SPINE WITHOUT CONTRAST TECHNIQUE: Multidetector CT imaging of the lumbar spine was performed without intravenous contrast administration. Multiplanar CT image reconstructions were also generated. COMPARISON:  CT lumbar  09/04/2017, MRI lumbar 09/04/2017 FINDINGS: Segmentation: Normal Alignment: Moderate lumbar scoliosis unchanged. 5 mm anterolisthesis L3-4 unchanged. Vertebrae: Negative for fracture or mass Paraspinal and other soft tissues: Atherosclerotic calcification aorta and iliacs. No retroperitoneal mass. Disc levels: T12-L1: Moderate disc and facet degeneration without significant stenosis L1-2: Moderate disc and facet degeneration. Left foraminal narrowing due to spurring. L2-3: Advanced disc degeneration and spurring on the left causing left foraminal encroachment unchanged. Bilateral facet hypertrophy. Mild narrowing of the canal. Mild right foraminal narrowing. L3-4: 5 mm anterolisthesis with severe facet hypertrophy. Remote laminectomy. Severe facet hypertrophy bilaterally contributing to moderate to severe spinal stenosis. Severe subarticular stenosis bilaterally unchanged. L4-5: Disc degeneration and spurring right greater than left. Severe facet degeneration. Remote laminectomy. Marked bony overgrowth of the facet joints contributing to moderate spinal stenosis. Marked subarticular stenosis bilaterally. L5-S1: Disc degeneration and spurring. Severe facet degeneration. Asymmetric soft tissue in the right foramen suspicious for right foraminal disc protrusion. Correlate with right L5 radicular pain. Severe foraminal encroachment bilaterally due to spurring and disc. IMPRESSION: Advanced multilevel disc and facet degeneration throughout the lumbar spine with moderate scoliosis. Negative for fracture No significant change from prior studies. Electronically Signed   By: Franchot Gallo M.D.   On: 09/05/2017 11:58   Ct Lumbar Spine Wo Contrast  Result Date: 09/04/2017 CLINICAL DATA:  Witnessed fall Friday.  Not ambulatory. EXAM: CT LUMBAR SPINE WITHOUT CONTRAST TECHNIQUE: Multidetector CT imaging of the lumbar spine was performed without intravenous contrast administration. Multiplanar CT image reconstructions were  also generated. COMPARISON:  Plain films 09/02/2017. FINDINGS: Segmentation: Standard. Alignment: Degenerative scoliosis convex RIGHT thoracolumbar junction. Mild anterolisthesis L3 on L4, 3 mm. Vertebrae: No acute fracture or focal pathologic process. Paraspinal and other soft tissues: Negative. Aortic atherosclerosis. Marked bladder distention. There appears to be marked prostate enlargement. Disc levels: L1-L2: Advanced disc space narrowing with osseous spurring. Vacuum phenomenon. LEFT subarticular zone and foraminal zone narrowing. L2-L3: Moderate disc space narrowing. Osseous spurring. Facet arthropathy.  Mild stenosis. No definite subarticular zone narrowing. Significant foraminal narrowing on the LEFT. L3-L4: Moderate disc space narrowing. 3 mm anterolisthesis. Calcified protrusion in the midline. Marked posterior element hypertrophy, primarily facet overgrowth. Severe stenosis. RIGHT greater than LEFT L4 nerve root impingement due to subarticular zone. The RIGHT L3 nerve root may be compressed in the foramen. L4-L5: Severe disc space narrowing with vacuum phenomenon. Osseous spurring. Advanced posterior element hypertrophy. Mild to moderate stenosis. RIGHT sided foraminal zone narrowing could affect the L4 nerve root. L5-S1: Moderate to advanced disc space narrowing. Facet arthropathy. No stenosis or subarticular zone narrowing, but LEFT-sided foraminal narrowing could affect the L5 nerve root. IMPRESSION: Multilevel spondylosis, with varying degrees of stenosis, subarticular zone narrowing and foraminal zone narrowing. 3 mm anterolisthesis L3-4 appears facet mediated. There is no significant slip to suggest ligamentous injury. No visible lower thoracic or lumbar spine compression fracture is evident. Aortic Atherosclerosis (ICD10-I70.0). Marked bladder distention, query bladder outlet obstruction Electronically Signed   By: Staci Righter M.D.   On: 09/04/2017 16:54   Mr Jodene Nam Head Wo Contrast  Result  Date: 09/25/2017 CLINICAL DATA:  Near syncopal event today. Fall 2 weeks ago. Orthostatic in emergency department. EXAM: MRI HEAD WITHOUT AND WITH CONTRAST MRA HEAD WITHOUT CONTRAST TECHNIQUE: Multiplanar, multiecho pulse sequences of the brain and surrounding structures were obtained without and with intravenous contrast. Angiographic images of the head were obtained using MRA technique without contrast. CONTRAST:  77mL MULTIHANCE GADOBENATE DIMEGLUMINE 529 MG/ML IV SOLN COMPARISON:  Head CT 09/24/2017 FINDINGS: MRI HEAD FINDINGS Multiple sequences are mildly to moderately motion degraded. Brain: There are numerous subcentimeter foci of acute infarction scattered throughout both cerebral hemispheres as well as left and possibly right cerebellar hemispheres. The there is cortical involvement in the frontal, parietal, and occipital lobes bilaterally, and there is also involvement of the left centrum semiovale. There is a small chronic infarct in the right cerebellum. Scattered T2 hyperintensities in the cerebral white matter and pons are nonspecific but compatible with mild chronic small vessel ischemic disease. There is mild-to-moderate cerebral atrophy. A 5 mm T2 hyperintense focus with prominent susceptibility artifact in the right parieto-occipital white matter corresponds to the subtle hyperattenuation on CT and is without associated enhancement or edema. Chronic microhemorrhages are noted in the right cerebellum and right parietal lobe. Vascular: Major intracranial vascular flow voids are preserved. Skull and upper cervical spine: Unremarkable bone marrow signal. Sinuses/Orbits: Unremarkable orbits. Paranasal sinuses and mastoid air cells are clear. Other: None. MRA HEAD FINDINGS There is intermittent moderate motion artifact. The visualized distal vertebral arteries are patent to the basilar with the left being mildly dominant. Patent left PICA and bilateral SCA origins are identified. The basilar artery is  widely patent and tortuous. Posterior communicating arteries are not identified. PCAs are patent without evidence of significant proximal stenosis. The internal carotid arteries are patent from skull base to carotid termini without evidence of significant stenosis. ACAs and MCAs are patent without evidence of proximal branch occlusion, A1 stenosis, or M1 stenosis. There is artifactual signal loss in bilateral M2 greater than A2 branches which limits assessment. No aneurysm is identified. IMPRESSION: 1. 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 2. Subcentimeter focus of chronic hemorrhage in the right parieto-occipital white matter compatible with a cavernoma. 3. Mild chronic small vessel ischemic disease and mild-to-moderate cerebral atrophy. 4. Patent circle of Willis without proximal branch occlusion or flow limiting proximal stenosis. Electronically Signed   By: Zenia Resides  Jeralyn Ruths M.D.   On: 09/28/2017 15:26   Mr Brain W And Wo Contrast  Result Date: 09/28/2017 CLINICAL DATA:  Near syncopal event today. Fall 2 weeks ago. Orthostatic in emergency department. EXAM: MRI HEAD WITHOUT AND WITH CONTRAST MRA HEAD WITHOUT CONTRAST TECHNIQUE: Multiplanar, multiecho pulse sequences of the brain and surrounding structures were obtained without and with intravenous contrast. Angiographic images of the head were obtained using MRA technique without contrast. CONTRAST:  41mL MULTIHANCE GADOBENATE DIMEGLUMINE 529 MG/ML IV SOLN COMPARISON:  Head CT 09/21/2017 FINDINGS: MRI HEAD FINDINGS Multiple sequences are mildly to moderately motion degraded. Brain: There are numerous subcentimeter foci of acute infarction scattered throughout both cerebral hemispheres as well as left and possibly right cerebellar hemispheres. The there is cortical involvement in the frontal, parietal, and occipital lobes bilaterally, and there is also involvement of the left centrum  semiovale. There is a small chronic infarct in the right cerebellum. Scattered T2 hyperintensities in the cerebral white matter and pons are nonspecific but compatible with mild chronic small vessel ischemic disease. There is mild-to-moderate cerebral atrophy. A 5 mm T2 hyperintense focus with prominent susceptibility artifact in the right parieto-occipital white matter corresponds to the subtle hyperattenuation on CT and is without associated enhancement or edema. Chronic microhemorrhages are noted in the right cerebellum and right parietal lobe. Vascular: Major intracranial vascular flow voids are preserved. Skull and upper cervical spine: Unremarkable bone marrow signal. Sinuses/Orbits: Unremarkable orbits. Paranasal sinuses and mastoid air cells are clear. Other: None. MRA HEAD FINDINGS There is intermittent moderate motion artifact. The visualized distal vertebral arteries are patent to the basilar with the left being mildly dominant. Patent left PICA and bilateral SCA origins are identified. The basilar artery is widely patent and tortuous. Posterior communicating arteries are not identified. PCAs are patent without evidence of significant proximal stenosis. The internal carotid arteries are patent from skull base to carotid termini without evidence of significant stenosis. ACAs and MCAs are patent without evidence of proximal branch occlusion, A1 stenosis, or M1 stenosis. There is artifactual signal loss in bilateral M2 greater than A2 branches which limits assessment. No aneurysm is identified. IMPRESSION: 1. 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 2. Subcentimeter focus of chronic hemorrhage in the right parieto-occipital white matter compatible with a cavernoma. 3. Mild chronic small vessel ischemic disease and mild-to-moderate cerebral atrophy. 4. Patent circle of Willis without proximal branch occlusion or flow limiting  proximal stenosis. Electronically Signed   By: Logan Bores M.D.   On: 09/14/2017 15:26   Mr Thoracic Spine Wo Contrast  Result Date: 09/04/2017 CLINICAL DATA:  Patient fell 09/02/2017. Severe back pain. Difficult to ambulate. EXAM: MRI THORACIC AND LUMBAR SPINE WITHOUT CONTRAST TECHNIQUE: Multiplanar and multiecho pulse sequences of the thoracic and lumbar spine were obtained without intravenous contrast. COMPARISON:  Plain films 09/02/2017. CT lumbar spine earlier today. FINDINGS: MRI THORACIC SPINE FINDINGS Alignment:  Physiologic. Vertebrae: No acute compression fracture or worrisome osseous lesion. Cord:  No significant cord compression or abnormal cord signal. Paraspinal and other soft tissues: Large goiter, substernal nodule extending on the LEFT. Median sternotomy. Gallstone. RIGHT pleural effusion. RIGHT hepatic cyst, approximate 1 x 2 cm. Disc levels: At T7-8 there is a LEFT paracentral protrusion. This does not result in stenosis or cord flattening. At T8-9 there is a central protrusion associated with posterior element hypertrophy. The T8-9 disc is mildly hyperintense, but there are no signs of infection. There is a RIGHT-sided facet joint  effusion associated with asymmetric bony overgrowth. At T10-11, there is a shallow central protrusion with borderline stenosis. At T11-12, there is asymmetric facet arthropathy on the LEFT. MRI LUMBAR SPINE FINDINGS Segmentation:  Standard. Alignment: 3 mm anterolisthesis L3-4 is facet mediated there is degenerative scoliosis convex RIGHT at the thoracolumbar junction, approximately 30 degrees. Vertebrae: No compression fracture or worrisome osseous lesion. Endplate reactive changes. Conus medullaris and cauda equina: Conus extends to the L1-L2 level. The conus appears normal. The cauda equina is compressed and displaced at multiple levels, which appears to be due to a combination of spondylosis and scoliosis. There is some areas, such as opposite L4 (series 6,  image 22) where the CSF has an appearance mimicking a subdural effusion or hygroma, there are no visible signs of intraspinal hematoma. Paraspinal and other soft tissues: The bladder is no longer distended. There is no retroperitoneal adenopathy. Disc levels: L1-L2: Disc space narrowing. Shallow central protrusion. Asymmetric facet arthropathy on the LEFT. Foraminal narrowing could affect the LEFT L1 nerve root. L2-L3: Disc space narrowing. Annular bulge with osseous spurring. Posterior element hypertrophy. Moderate stenosis. BILATERAL L2 and L3 nerve root impingement are possible. L3-L4: Disc space narrowing. 3 mm anterolisthesis. Central protrusion. Posterior element hypertrophy. Severe stenosis. BILATERAL L4 nerve root impingement in the canal. RIGHT L3 nerve root impingement in the foramen. Interspinous bursa edema, consistent with Baastrup's disease. L4-L5: Disc space narrowing osseous spurring. Annular bulge. Posterior element hypertrophy. Moderate stenosis. RIGHT greater than LEFT L5 nerve root impingement. L5-S1: Disc hyperintensity without signs of infection. Central protrusion. Posterior element hypertrophy. Mild stenosis. BILATERAL subarticular zone and foraminal zone narrowing affecting the L5 and S1 nerve roots. IMPRESSION: MR THORACIC SPINE IMPRESSION No thoracic spine compression fracture or visible hematoma. Multilevel spondylosis, worst at T8-9. mild stenosis at this level without thoracic cord compression. MR LUMBAR SPINE IMPRESSION No lumbar spine compression fracture or visible hematoma. Mild anterolisthesis at L3-4 is degenerative, not posttraumatic. Severe spinal stenosis at L3-4 relates to slip, posterior element hypertrophy, and central protrusion, affecting the L3 and L4 nerve roots. See discussion above. Moderate stenosis at L4-5 and L5-S1, related to disc space narrowing, bony overgrowth, disc material, and posterior element hypertrophy. Electronically Signed   By: Staci Righter M.D.   On:  09/04/2017 19:53   Mr Lumbar Spine Wo Contrast  Result Date: 09/04/2017 CLINICAL DATA:  Patient fell 09/02/2017. Severe back pain. Difficult to ambulate. EXAM: MRI THORACIC AND LUMBAR SPINE WITHOUT CONTRAST TECHNIQUE: Multiplanar and multiecho pulse sequences of the thoracic and lumbar spine were obtained without intravenous contrast. COMPARISON:  Plain films 09/02/2017. CT lumbar spine earlier today. FINDINGS: MRI THORACIC SPINE FINDINGS Alignment:  Physiologic. Vertebrae: No acute compression fracture or worrisome osseous lesion. Cord:  No significant cord compression or abnormal cord signal. Paraspinal and other soft tissues: Large goiter, substernal nodule extending on the LEFT. Median sternotomy. Gallstone. RIGHT pleural effusion. RIGHT hepatic cyst, approximate 1 x 2 cm. Disc levels: At T7-8 there is a LEFT paracentral protrusion. This does not result in stenosis or cord flattening. At T8-9 there is a central protrusion associated with posterior element hypertrophy. The T8-9 disc is mildly hyperintense, but there are no signs of infection. There is a RIGHT-sided facet joint effusion associated with asymmetric bony overgrowth. At T10-11, there is a shallow central protrusion with borderline stenosis. At T11-12, there is asymmetric facet arthropathy on the LEFT. MRI LUMBAR SPINE FINDINGS Segmentation:  Standard. Alignment: 3 mm anterolisthesis L3-4 is facet mediated there is degenerative scoliosis convex RIGHT  at the thoracolumbar junction, approximately 30 degrees. Vertebrae: No compression fracture or worrisome osseous lesion. Endplate reactive changes. Conus medullaris and cauda equina: Conus extends to the L1-L2 level. The conus appears normal. The cauda equina is compressed and displaced at multiple levels, which appears to be due to a combination of spondylosis and scoliosis. There is some areas, such as opposite L4 (series 6, image 22) where the CSF has an appearance mimicking a subdural effusion or  hygroma, there are no visible signs of intraspinal hematoma. Paraspinal and other soft tissues: The bladder is no longer distended. There is no retroperitoneal adenopathy. Disc levels: L1-L2: Disc space narrowing. Shallow central protrusion. Asymmetric facet arthropathy on the LEFT. Foraminal narrowing could affect the LEFT L1 nerve root. L2-L3: Disc space narrowing. Annular bulge with osseous spurring. Posterior element hypertrophy. Moderate stenosis. BILATERAL L2 and L3 nerve root impingement are possible. L3-L4: Disc space narrowing. 3 mm anterolisthesis. Central protrusion. Posterior element hypertrophy. Severe stenosis. BILATERAL L4 nerve root impingement in the canal. RIGHT L3 nerve root impingement in the foramen. Interspinous bursa edema, consistent with Baastrup's disease. L4-L5: Disc space narrowing osseous spurring. Annular bulge. Posterior element hypertrophy. Moderate stenosis. RIGHT greater than LEFT L5 nerve root impingement. L5-S1: Disc hyperintensity without signs of infection. Central protrusion. Posterior element hypertrophy. Mild stenosis. BILATERAL subarticular zone and foraminal zone narrowing affecting the L5 and S1 nerve roots. IMPRESSION: MR THORACIC SPINE IMPRESSION No thoracic spine compression fracture or visible hematoma. Multilevel spondylosis, worst at T8-9. mild stenosis at this level without thoracic cord compression. MR LUMBAR SPINE IMPRESSION No lumbar spine compression fracture or visible hematoma. Mild anterolisthesis at L3-4 is degenerative, not posttraumatic. Severe spinal stenosis at L3-4 relates to slip, posterior element hypertrophy, and central protrusion, affecting the L3 and L4 nerve roots. See discussion above. Moderate stenosis at L4-5 and L5-S1, related to disc space narrowing, bony overgrowth, disc material, and posterior element hypertrophy. Electronically Signed   By: Staci Righter M.D.   On: 09/04/2017 19:53   Dg Chest Port 1 View  Result Date:  09/08/2017 CLINICAL DATA:  Fever and leukocytosis. EXAM: PORTABLE CHEST 1 VIEW COMPARISON:  09/04/2017 FINDINGS: 1234 hours. The lungs are clear without focal pneumonia, edema, pneumothorax or pleural effusion. The cardiopericardial silhouette is within normal limits for size. Degenerative changes noted in the shoulders bilaterally status post median sternotomy. Telemetry leads overlie the chest. IMPRESSION: Low lung volumes without acute findings. Electronically Signed   By: Misty Stanley M.D.   On: 09/25/2017 12:50    Lab Data:  CBC: Recent Labs  Lab 09/23/2017 1102  WBC 19.9*  NEUTROABS 17.2*  HGB 14.4  HCT 43.8  MCV 81.0  PLT 161   Basic Metabolic Panel: Recent Labs  Lab 09/19/2017 1102  NA 136  K 4.4  CL 97*  CO2 26  GLUCOSE 189*  BUN 62*  CREATININE 1.55*  CALCIUM 9.7   GFR: Estimated Creatinine Clearance: 43.2 mL/min (A) (by C-G formula based on SCr of 1.55 mg/dL (H)). Liver Function Tests: Recent Labs  Lab 09/09/2017 1102  AST 84*  ALT 151*  ALKPHOS 152*  BILITOT 1.0  PROT 6.5  ALBUMIN 2.5*   No results for input(s): LIPASE, AMYLASE in the last 168 hours. No results for input(s): AMMONIA in the last 168 hours. Coagulation Profile: No results for input(s): INR, PROTIME in the last 168 hours. Cardiac Enzymes: Recent Labs  Lab 09/27/2017 1102 09/05/2017 1648  TROPONINI 0.05* 0.05*   BNP (last 3 results) No results for input(s):  PROBNP in the last 8760 hours. HbA1C: No results for input(s): HGBA1C in the last 72 hours. CBG: No results for input(s): GLUCAP in the last 168 hours. Lipid Profile: No results for input(s): CHOL, HDL, LDLCALC, TRIG, CHOLHDL, LDLDIRECT in the last 72 hours. Thyroid Function Tests: No results for input(s): TSH, T4TOTAL, FREET4, T3FREE, THYROIDAB in the last 72 hours. Anemia Panel: No results for input(s): VITAMINB12, FOLATE, FERRITIN, TIBC, IRON, RETICCTPCT in the last 72 hours. Urine analysis:    Component Value Date/Time    COLORURINE YELLOW 09/14/2017 [place] 09/17/2017 1245   LABSPEC 1.018 09/11/2017 1245   PHURINE 5.0 09/12/2017 1245   GLUCOSEU 50 (A) 09/17/2017 1245   HGBUR SMALL (A) 09/10/2017 1245   BILIRUBINUR NEGATIVE 09/19/2017 1245   KETONESUR NEGATIVE 09/27/2017 1245   PROTEINUR NEGATIVE 09/05/2017 1245   UROBILINOGEN 1.0 08/14/2011 0959   NITRITE NEGATIVE 09/04/2017 1245   LEUKOCYTESUR NEGATIVE 09/11/2017 1245     Makenleigh Crownover M.D. [facility] 09/19/2017, 10:33 AM  Pager: [phone] Between 7am to 7pm - call Pager - [phone]  After 7pm go to [email or web address] - password [email or web address]  Call night coverage person covering after 7pm

## 2017-09-19 NOTE — Progress Notes (Signed)
Missing dose of keppra. Sent pharmacy a note to please send up.

## 2017-09-19 NOTE — Consult Note (Signed)
Requesting Physician: Dr. Nehemiah Settle    Chief Complaint: Seizure   History obtained from:  Chart   HPI:                                                                                                                                       Joe Shah is an 77 y.o. 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  The patient is a poor historian. According to chart the patient was getting dressed in the morning.His son was helping him when he suddenly fell back on the bed became extremely rigid. Last about 45 seconds followed by 5 minutes of confusion disorientation. Patient taken to ER. 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   Date last known well: 2.16-19 Time last known well: morning tPA Given: no, outside windiw NIHSS: 5 Baseline MRS 1  Past Medical History:  Diagnosis Date  . ADD (attention deficit disorder with hyperactivity)   . Aortic stenosis   . Aortic valve disorders   . Arthritis   . Bruises easily   . Cancer [facility])    bladder cancer  . Head injury    at age 89 from [place]  . Heart murmur   . HOH (hard of hearing)   . Hyperlipidemia    takes Zocor daily  . Ischemic stroke ([place]) 09/09/2017  . Microscopic hematuria    managed by medical MD Dr.Cynthia Melina Copa in [place]  . Other and unspecified hyperlipidemia   . Other B-complex deficiencies   . Scoliosis   . Shortness of breath    with exertion   . Unspecified essential hypertension    takes Accupril daily  . Urinary frequency     Past Surgical History:  Procedure Laterality Date  . AORTIC VALVE REPLACEMENT  08/18/2011   Procedure: AORTIC VALVE REPLACEMENT (AVR);  Surgeon:  Grace Isaac, MD;  Location: [facility];  Service: Open Heart Surgery;  Laterality: N/A;  On pump.  Marland Kitchen BACK SURGERY  2009  . CARDIAC CATHETERIZATION  2008   moderate AS. Normal coronary arteries except for mild plaque in LAD  . COLONOSCOPY    . HEMORRHOID SURGERY  30+yrs ago  . KNEE ARTHROSCOPY  15+yrs ago;2012   x 2 left  . THYROIDECTOMY, PARTIAL  20+yrs ago  . TONSILLECTOMY     as a child    Family History  Problem Relation Age of Onset  . Heart failure Mother        died 76  . Brain cancer Father        died 33  . Anesthesia problems Neg Hx   . Hypotension Neg Hx   .  Malignant hyperthermia Neg Hx   . Pseudochol deficiency Neg Hx    Social History:  reports that he quit smoking about 16 years ago. His smoking use included cigarettes. He started smoking about 60 years ago. He has a 17.50 pack-year smoking history. He quit smokeless tobacco use about 6 years ago. His smokeless tobacco use included snuff. He reports that he drinks about 0.6 oz of alcohol per week. He reports that he does not use drugs.  Allergies: No Known Allergies  Medications:                                                                                                                        I reviewed home medications   ROS:                                                                                                                                     14 systems reviewed and negative except above    Examination:                                                                                                      General: Appears well-developed and well-nourished. Not cooperative one exam. Not in distress.  Psych: Affect appropriate to situation Eyes: No scleral injection HENT: No OP obstrucion Head: Normocephalic.  Cardiovascular: Normal rate and regular rhythm.  Respiratory: Effort normal and breath sounds normal to anterior ascultation GI: Soft.  No distension. There is no tenderness.  Skin:  WDI   Neurological Examination Mental Status: Alert,confused.Oriented to himself and place  Speech fluent without evidence of aphasia. Able to follow simple commands without difficulty. Cranial Nerves: II: Visual fields grossly normal,  III,IV, VI: ptosis not present, extra-ocular motions intact bilaterally, pupils equal, round, reactive to light and accommodation V,VII: smile symmetric, facial light touch sensation normal bilaterally VIII: hearing normal bilaterally IX,X: uvula rises symmetrically XI: bilateral shoulder shrug XII: midline tongue extension Motor: Right : Upper extremity   4/5  Left:     Upper extremity   5/5  Lower extremity   5/5     Lower extremity   5/5 Tone and bulk:normal tone throughout; no atrophy noted Sensory: Pinprick and light touch intact throughout, bilaterally Deep Tendon Reflexes: did not assess  Plantars: Right: downgoing   Left: downgoing Cerebellar: normal finger-to-nose, normal rapid alternating movements and normal heel-to-shin test Gait: normal gait and station     Lab Results: Basic Metabolic Panel: Recent Labs  Lab 09/06/2017 1102  NA 136  K 4.4  CL 97*  CO2 26  GLUCOSE 189*  BUN 62*  CREATININE 1.55*  CALCIUM 9.7    CBC: Recent Labs  Lab 09/04/2017 1102  WBC 19.9*  NEUTROABS 17.2*  HGB 14.4  HCT 43.8  MCV 81.0  PLT 160    Coagulation Studies: No results for input(s): LABPROT, INR in the last 72 hours.  Imaging: Ct Head Wo Contrast  Result Date: 09/17/2017 CLINICAL DATA:  Altered level of consciousness. Fall 2 weeks ago. Near syncope today. EXAM: CT HEAD WITHOUT CONTRAST TECHNIQUE: Contiguous axial images were obtained from the base of the skull through the vertex without intravenous contrast. COMPARISON:  None. FINDINGS: Brain: Mild atrophy. Negative for hydrocephalus. Negative for acute infarct. Mild chronic white matter changes. Subtle hyper density in the right parietal periventricular white matter measuring  approximately 7 mm. This does not appear to represent acute hemorrhage but could be subacute hemorrhage or iron deposition from cavernoma which is favored. Hyperdense tumor is also a possibility. Vascular: Atherosclerotic calcification. Negative for acute vascular thrombosis. Skull: Negative Sinuses/Orbits: Negative Other: None IMPRESSION: Mild atrophy and chronic microvascular ischemia in the white matter. No acute infarct. 7 mm subtle hyperdensity in the right parietal periventricular white matter, favor chronic cavernoma. Subacute hemorrhage and hyperdense tumor are possible. Therefore, MRI brain without with contrast recommended for further evaluation. Electronically Signed   By: Franchot Gallo M.D.   On: 09/27/2017 12:02   Ct Angio Neck W Or Wo Contrast  Result Date: 09/10/2017 CLINICAL DATA:  Syncopal/near syncopal event versus seizure. Scattered bilateral cerebral and cerebellar infarcts on MRI. EXAM: CT ANGIOGRAPHY NECK TECHNIQUE: Multidetector CT imaging of the neck was performed using the standard protocol during bolus administration of intravenous contrast. Multiplanar CT image reconstructions and MIPs were obtained to evaluate the vascular anatomy. Carotid stenosis measurements (when applicable) are obtained utilizing NASCET criteria, using the distal internal carotid diameter as the denominator. CONTRAST:  65 mL Isovue 370 COMPARISON:  None. FINDINGS: Aortic arch: Standard 3 vessel aortic arch with mild atherosclerotic plaque. Widely patent brachiocephalic and subclavian arteries with scattered non stenotic plaque. Right carotid system: Patent without evidence of stenosis or dissection. Tortuous mid to distal cervical ICA. Left carotid system: Patent without evidence of significant stenosis or dissection. Mild-to-moderate predominantly calcified plaque in the proximal ICA. Tortuous distal cervical ICA. Vertebral arteries: Patent without evidence of significant stenosis or dissection. Mildly dominant  left vertebral artery. Nonstenotic calcified plaque at the right vertebral artery origin. Skeleton: Severe right glenohumeral arthropathy. Severe diffuse cervical disc and right-sided facet degeneration. Other neck: Prior right thyroidectomy. Enlarged and heterogeneous left thyroid lobe with an underlying 4.5 cm mass which laterally deviates the common carotid artery and internal jugular vein and slightly deviates the trachea. Upper chest: Mild centrilobular and paraseptal emphysema. IMPRESSION: 1. Patent cervical carotid and vertebral arteries without stenosis or dissection. Nonstenotic plaque in the left ICA. 2.  Aortic Atherosclerosis (ICD10-I70.0). 3. Prior right thyroidectomy. 4.5 cm left thyroid mass.  Recommend correlation with presumed prior outside thyroid imaging. Electronically Signed   By: Logan Bores M.D.   On: 09/28/2017 18:18   Ct Lumbar Spine Wo Contrast  Result Date: 09/20/2017 CLINICAL DATA:  Back pain.  Fall 2 weeks ago. EXAM: CT LUMBAR SPINE WITHOUT CONTRAST TECHNIQUE: Multidetector CT imaging of the lumbar spine was performed without intravenous contrast administration. Multiplanar CT image reconstructions were also generated. COMPARISON:  CT lumbar 09/04/2017, MRI lumbar 09/04/2017 FINDINGS: Segmentation: Normal Alignment: Moderate lumbar scoliosis unchanged. 5 mm anterolisthesis L3-4 unchanged. Vertebrae: Negative for fracture or mass Paraspinal and other soft tissues: Atherosclerotic calcification aorta and iliacs. No retroperitoneal mass. Disc levels: T12-L1: Moderate disc and facet degeneration without significant stenosis L1-2: Moderate disc and facet degeneration. Left foraminal narrowing due to spurring. L2-3: Advanced disc degeneration and spurring on the left causing left foraminal encroachment unchanged. Bilateral facet hypertrophy. Mild narrowing of the canal. Mild right foraminal narrowing. L3-4: 5 mm anterolisthesis with severe facet hypertrophy. Remote laminectomy. Severe  facet hypertrophy bilaterally contributing to moderate to severe spinal stenosis. Severe subarticular stenosis bilaterally unchanged. L4-5: Disc degeneration and spurring right greater than left. Severe facet degeneration. Remote laminectomy. Marked bony overgrowth of the facet joints contributing to moderate spinal stenosis. Marked subarticular stenosis bilaterally. L5-S1: Disc degeneration and spurring. Severe facet degeneration. Asymmetric soft tissue in the right foramen suspicious for right foraminal disc protrusion. Correlate with right L5 radicular pain. Severe foraminal encroachment bilaterally due to spurring and disc. IMPRESSION: Advanced multilevel disc and facet degeneration throughout the lumbar spine with moderate scoliosis. Negative for fracture No significant change from prior studies. Electronically Signed   By: Franchot Gallo M.D.   On: 09/05/2017 11:58   Mr Jodene Nam Head Wo Contrast  Result Date: 09/27/2017 CLINICAL DATA:  Near syncopal event today. Fall 2 weeks ago. Orthostatic in emergency department. EXAM: MRI HEAD WITHOUT AND WITH CONTRAST MRA HEAD WITHOUT CONTRAST TECHNIQUE: Multiplanar, multiecho pulse sequences of the brain and surrounding structures were obtained without and with intravenous contrast. Angiographic images of the head were obtained using MRA technique without contrast. CONTRAST:  16mL MULTIHANCE GADOBENATE DIMEGLUMINE 529 MG/ML IV SOLN COMPARISON:  Head CT 09/05/2017 FINDINGS: MRI HEAD FINDINGS Multiple sequences are mildly to moderately motion degraded. Brain: There are numerous subcentimeter foci of acute infarction scattered throughout both cerebral hemispheres as well as left and possibly right cerebellar hemispheres. The there is cortical involvement in the frontal, parietal, and occipital lobes bilaterally, and there is also involvement of the left centrum semiovale. There is a small chronic infarct in the right cerebellum. Scattered T2 hyperintensities in the cerebral  white matter and pons are nonspecific but compatible with mild chronic small vessel ischemic disease. There is mild-to-moderate cerebral atrophy. A 5 mm T2 hyperintense focus with prominent susceptibility artifact in the right parieto-occipital white matter corresponds to the subtle hyperattenuation on CT and is without associated enhancement or edema. Chronic microhemorrhages are noted in the right cerebellum and right parietal lobe. Vascular: Major intracranial vascular flow voids are preserved. Skull and upper cervical spine: Unremarkable bone marrow signal. Sinuses/Orbits: Unremarkable orbits. Paranasal sinuses and mastoid air cells are clear. Other: None. MRA HEAD FINDINGS There is intermittent moderate motion artifact. The visualized distal vertebral arteries are patent to the basilar with the left being mildly dominant. Patent left PICA and bilateral SCA origins are identified. The basilar artery is widely patent and tortuous. Posterior communicating arteries are not identified. PCAs are patent without evidence of significant proximal stenosis. The internal carotid arteries are  patent from skull base to carotid termini without evidence of significant stenosis. ACAs and MCAs are patent without evidence of proximal branch occlusion, A1 stenosis, or M1 stenosis. There is artifactual signal loss in bilateral M2 greater than A2 branches which limits assessment. No aneurysm is identified. IMPRESSION: 1. 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 2. Subcentimeter focus of chronic hemorrhage in the right parieto-occipital white matter compatible with a cavernoma. 3. Mild chronic small vessel ischemic disease and mild-to-moderate cerebral atrophy. 4. Patent circle of Willis without proximal branch occlusion or flow limiting proximal stenosis. Electronically Signed   By: Logan Bores M.D.   On: 09/15/2017 15:26   Mr Brain W And Wo  Contrast  Result Date: 09/17/2017 CLINICAL DATA:  Near syncopal event today. Fall 2 weeks ago. Orthostatic in emergency department. EXAM: MRI HEAD WITHOUT AND WITH CONTRAST MRA HEAD WITHOUT CONTRAST TECHNIQUE: Multiplanar, multiecho pulse sequences of the brain and surrounding structures were obtained without and with intravenous contrast. Angiographic images of the head were obtained using MRA technique without contrast. CONTRAST:  14mL MULTIHANCE GADOBENATE DIMEGLUMINE 529 MG/ML IV SOLN COMPARISON:  Head CT 09/20/2017 FINDINGS: MRI HEAD FINDINGS Multiple sequences are mildly to moderately motion degraded. Brain: There are numerous subcentimeter foci of acute infarction scattered throughout both cerebral hemispheres as well as left and possibly right cerebellar hemispheres. The there is cortical involvement in the frontal, parietal, and occipital lobes bilaterally, and there is also involvement of the left centrum semiovale. There is a small chronic infarct in the right cerebellum. Scattered T2 hyperintensities in the cerebral white matter and pons are nonspecific but compatible with mild chronic small vessel ischemic disease. There is mild-to-moderate cerebral atrophy. A 5 mm T2 hyperintense focus with prominent susceptibility artifact in the right parieto-occipital white matter corresponds to the subtle hyperattenuation on CT and is without associated enhancement or edema. Chronic microhemorrhages are noted in the right cerebellum and right parietal lobe. Vascular: Major intracranial vascular flow voids are preserved. Skull and upper cervical spine: Unremarkable bone marrow signal. Sinuses/Orbits: Unremarkable orbits. Paranasal sinuses and mastoid air cells are clear. Other: None. MRA HEAD FINDINGS There is intermittent moderate motion artifact. The visualized distal vertebral arteries are patent to the basilar with the left being mildly dominant. Patent left PICA and bilateral SCA origins are identified. The  basilar artery is widely patent and tortuous. Posterior communicating arteries are not identified. PCAs are patent without evidence of significant proximal stenosis. The internal carotid arteries are patent from skull base to carotid termini without evidence of significant stenosis. ACAs and MCAs are patent without evidence of proximal branch occlusion, A1 stenosis, or M1 stenosis. There is artifactual signal loss in bilateral M2 greater than A2 branches which limits assessment. No aneurysm is identified. IMPRESSION: 1. 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 2. Subcentimeter focus of chronic hemorrhage in the right parieto-occipital white matter compatible with a cavernoma. 3. Mild chronic small vessel ischemic disease and mild-to-moderate cerebral atrophy. 4. Patent circle of Willis without proximal branch occlusion or flow limiting proximal stenosis. Electronically Signed   By: Logan Bores M.D.   On: 09/24/2017 15:26   Dg Chest Port 1 View  Result Date: 09/15/2017 CLINICAL DATA:  Fever and leukocytosis. EXAM: PORTABLE CHEST 1 VIEW COMPARISON:  09/04/2017 FINDINGS: 1234 hours. The lungs are clear without focal pneumonia, edema, pneumothorax or pleural effusion. The cardiopericardial silhouette is within normal limits for size. Degenerative changes  noted in the shoulders bilaterally status post median sternotomy. Telemetry leads overlie the chest. IMPRESSION: Low lung volumes without acute findings. Electronically Signed   By: Misty Stanley M.D.   On: 09/21/2017 12:50     ASSESSMENT AND PLAN  Joe Shah is a 77 y.o. 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 MRI brain shows patient has multiple bilateral small infarcts predominantly in the watershed territory but this could also be cardioembolic stroke given pAFib.    Acute Ischemic Stroke   Risk factors: HTN, HLD, pAFIB Etiology: Watershed strokes vs Cardioembolic strokes  Recommend # MRI of the brain without contrast #MRA Head and neck  #Transthoracic Echo  # Start patient on ASA 325mg  daily, consider starting Anticoagulation #Start or continue Atorvastatin 80 mg/other high intensity statin # BP goal: permissive HTN upto 122 systolic, PRNs above 482 # HBAIC and Lipid profile # Telemetry monitoring # Frequent neuro checks # NPO until passes stroke swallow screen   Syncope vs Seizure Likely  syncope, but possibly a seizure in the setting of embolic infarcts Routine EEG Orthostatic vitals Will not start AED at this time     Please page stroke NP  Or  PA  Or MD from 8am -4 pm  as this patient from this time will be  followed by the stroke.   You can look them up on [email or web address]  Password [facility]   Sushanth Aroor [facility] Pager Number [phone]

## 2017-09-19 NOTE — NC FL2 (Signed)
[place] LEVEL OF CARE SCREENING TOOL     IDENTIFICATION  Patient Name: Joe Shah Birthdate: 1941/05/13 Sex: male Admission Date (Current Location): 09/05/2017  [facility] and [place] Number:  Herbalist and Address:  The . [facility], [place] [address], [place], [place] [address]      Provider Number: [id number]  Attending Physician Name and Address:  Mendel Corning, MD  Relative Name and Phone Number:       Current Level of Care: Hospital Recommended Level of Care: [place] Prior Approval Number:    Date Approved/Denied:   PASRR Number: [id number]  Discharge Plan: SNF    Current Diagnoses: Patient Active Problem List   Diagnosis Date Noted  . Bacteremia due to Enterococcus 09/19/2017  . Syncope   . Prosthetic valve endocarditis ([place])   . Septic embolism ([place])   . Cerebrovascular accident (CVA) ([place]) 09/27/2017  . Leukocytosis 09/07/2017  . Elevated troponin 09/16/2017  . New onset seizure ([place]) 09/06/2017  . Cauda equina compression ([place]) 09/05/2017  . Spinal stenosis, lumbar 09/04/2017  . CAD (coronary artery disease) 01/11/2012  . Paroxysmal atrial fibrillation ([place]) 09/11/2011  . Aortic valve Replacement 08/18/2011  . Other B-complex deficiencies   . Carotid bruit 07/24/2011  . Essential hypertension   . Aortic valve disorders     Orientation RESPIRATION BLADDER Height & Weight     Self, Place  Normal Continent Weight: 166 lb 0.1 oz (75.3 kg) Height:  6' (182.9 cm)  BEHAVIORAL SYMPTOMS/MOOD NEUROLOGICAL BOWEL NUTRITION STATUS      Continent (Please see d/c summary)  AMBULATORY STATUS COMMUNICATION OF NEEDS Skin   Extensive Assist Verbally Normal                       Personal Care Assistance Level of Assistance  Bathing, Feeding, Dressing Bathing Assistance: Maximum assistance Feeding assistance: Limited assistance Dressing Assistance: Maximum assistance     Functional  Limitations Info  Sight, Hearing, Speech Sight Info: Adequate Hearing Info: Adequate Speech Info: Impaired(Expressive aphasia;Clear)    SPECIAL CARE FACTORS FREQUENCY  PT (By licensed PT), OT (By licensed OT)     PT Frequency: 4x OT Frequency: 4x            Contractures Contractures Info: Not present    Additional Factors Info  Code Status, Allergies Code Status Info: DNR Allergies Info: No known allergies           Current Medications (09/19/2017):  This is the current hospital active medication list Current Facility-Administered Medications  Medication Dose Route Frequency Provider Last Rate Last Dose  .  stroke: mapping our early stages of recovery book   Does not apply Once Stinson, Jacob J, DO      . 0.9 %  sodium chloride infusion  75 mL/hr Intravenous Continuous Truett Mainland, DO 75 mL/hr at 09/30/2017 1818 75 mL/hr at 09/05/2017 1818  . acetaminophen (TYLENOL) tablet 650 mg  650 mg Oral Q6H PRN Truett Mainland, DO   650 mg at 09/19/17 1515  . ampicillin (OMNIPEN) 2 g in sodium chloride 0.9 % 100 mL IVPB  2 g Intravenous Q6H Rai, Ripudeep K, MD   Stopped at 09/19/17 1005  . aspirin suppository 300 mg  300 mg Rectal Daily Truett Mainland, DO       Or  . aspirin tablet 325 mg  325 mg Oral Daily Truett Mainland, DO   325 mg at  09/19/17 0931  . DAPTOmycin (CUBICIN) 600 mg in sodium chloride 0.9 % IVPB  600 mg Intravenous Q24H Bajbus, Lauren D, RPH   Stopped at 09/19/17 1518  . enoxaparin (LOVENOX) injection 40 mg  40 mg Subcutaneous Q24H Truett Mainland, DO   40 mg at 09/04/2017 1809  . gabapentin (NEURONTIN) capsule 300 mg  300 mg Oral Daily PRN Truett Mainland, DO      . levETIRAcetam (KEPPRA) 500 mg in sodium chloride 0.9 % 100 mL IVPB  500 mg Intravenous Q12H Stinson, Jacob J, DO      . senna-docusate (Senokot-S) tablet 1 tablet  1 tablet Oral QHS PRN Truett Mainland, DO      . simvastatin (ZOCOR) tablet 20 mg  20 mg Oral QPM Truett Mainland, DO   20 mg at  09/29/2017 1808  . tamsulosin (FLOMAX) capsule 0.4 mg  0.4 mg Oral Daily Truett Mainland, DO   0.4 mg at 09/19/17 1561     Discharge Medications: Please see discharge summary for a list of discharge medications.  Relevant Imaging Results:  Relevant Lab Results:   Additional Information sSN; [id number]  Eileen Stanford, LCSW

## 2017-09-19 NOTE — Evaluation (Signed)
Occupational Therapy Evaluation Patient Details Name: Joe Shah MRN: [id number] DOB: 1940/10/17 Today's Date: 09/19/2017    History of Present Illness Pt is a 77 y.o. male with a history of hypertension, hyperlipidemia, aortic stenosis s/p porcine replacement in 2013 by Dr. 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 He presented from home after falling back on his bed, becoming rigit, and having an increase in confusion. MRI revealing scattered small acute infarcts throughout cerebral and cerebellar hemispheres with concern for watershed vs cardioembolic infarcts.    Clinical Impression   PTA, pt had been utilizing RW for functional mobility since discharge 2/6 s/p fall with back injury. Pt additionally was requiring some assistance for ADL since then as well. Prior to recent admission, he had been independent with ADL and taking care of his wife. Pt presenting to OT session today confused with decreased orientation, attention, and memory. He demonstrated decreased ability to adhere to back precautions today and requires max assist for LB ADL, min assist for UB ADL, and up to mod assist for balance taking a few steps at EOB with RW in preparation for ADL transfers. Pt's daughter present and engaged in session today. Feel pt will need short-term SNF level rehabilitation post-acute D/C prior to returning home and OT will continue to follow while admitted. Family is in agreement with D/C recommendation.    Follow Up Recommendations  SNF;Supervision/Assistance - 24 hour    Equipment Recommendations  Other (comment)(defer to next venue of care)    Recommendations for Other Services       Precautions / Restrictions Precautions Precautions: Fall;Back Precaution Comments: educated for back  precautions with family Restrictions Weight Bearing Restrictions: No      Mobility Bed Mobility Overal bed mobility: Needs Assistance Bed Mobility: Rolling;Sidelying to Sit;Sit to Sidelying Rolling: Min assist Sidelying to sit: Mod assist     Sit to sidelying: Min assist General bed mobility comments: Mod assist to power trunk from [facility].   Transfers Overall transfer level: Needs assistance Equipment used: Rolling walker (2 wheeled) Transfers: Sit to/from Stand Sit to Stand: Min assist;Mod assist         General transfer comment: Min assist to power up into standing position. Mod assist for balance once standing.     Balance Overall balance assessment: Needs assistance Sitting-balance support: No upper extremity supported;Feet supported Sitting balance-Leahy Scale: Fair Sitting balance - Comments: Leaning forward and unable to maintain straight back. Concern for back precautions.    Standing balance support: During functional activity;Bilateral upper extremity supported Standing balance-Leahy Scale: Poor Standing balance comment: Required assistance to maintain standing.                           ADL either performed or assessed with clinical judgement   ADL Overall ADL's : Needs assistance/impaired Eating/Feeding: Set up;Sitting   Grooming: Set up;Sitting   Upper Body Bathing: Minimal assistance;Sitting   Lower Body Bathing: Maximal assistance;Sit to/from stand   Upper Body Dressing : Minimal assistance;Sitting   Lower Body Dressing: Maximal assistance;Sit to/from stand   Toilet Transfer: Minimal assistance;RW;Moderate assistance Toilet Transfer Details (indicate cue type and reason): Min assist to power up. Mod assist for safety once on feet to maintain balance.  Toileting- Clothing Manipulation and Hygiene: Maximal assistance;Sit to/from stand  General ADL Comments: Pt able to take small steps at EOB and requiring mod assist to stand and  maintain balance.      Vision   Additional Comments: Closing his eyes throughout the majority of session but able to track and make eye contact. Will continue to assess.      Perception     Praxis      Pertinent Vitals/Pain Pain Assessment: Faces Faces Pain Scale: Hurts little more Pain Location: back Pain Descriptors / Indicators: Grimacing Pain Intervention(s): Limited activity within patient's tolerance;Monitored during session;Repositioned     Hand Dominance Right   Extremity/Trunk Assessment Upper Extremity Assessment Upper Extremity Assessment: RUE deficits/detail;Generalized weakness RUE Deficits / Details: Decreased R shoulder AROM at baseline due to previous injury.   Lower Extremity Assessment Lower Extremity Assessment: Defer to PT evaluation       Communication Communication Communication: HOH   Cognition Arousal/Alertness: Lethargic Behavior During Therapy: WFL for tasks assessed/performed Overall Cognitive Status: Impaired/Different from baseline Area of Impairment: Following commands;Safety/judgement;Attention;Orientation;Memory;Awareness;Problem solving                 Orientation Level: Disoriented to;Place;Time;Situation Current Attention Level: Sustained Memory: Decreased short-term memory;Decreased recall of precautions Following Commands: Follows one step commands consistently;Follows multi-step commands inconsistently Safety/Judgement: Decreased awareness of safety Awareness: Emergent Problem Solving: Slow processing;Decreased initiation;Difficulty sequencing;Requires verbal cues;Requires tactile cues General Comments: Pt confused throughout session. 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 but "it didn't work out."    Database administrator Instructions      Home Living Family/patient expects to be discharged to:: Skilled nursing facility Living  Arrangements: Spouse/significant other Available Help at Discharge: Family;Available PRN/intermittently Type of Home: House Home Access: Ramped entrance     Home Layout: One level     Bathroom Shower/Tub: Occupational psychologist: Handicapped height     Home Equipment: Environmental consultant - 2 wheels;Shower seat          Prior Functioning/Environment Level of Independence: Needs assistance  Gait / Transfers Assistance Needed: Has been using RW or cane since back injury ADL's / Homemaking Assistance Needed: Was independent prior to his back injury. However, has required assistance since then.   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 Per daughter wife is unable to provide any assistance.         OT Problem List: Decreased strength;Decreased range of motion;Decreased activity tolerance;Impaired balance (sitting and/or standing);Impaired vision/perception;Decreased cognition;Decreased safety awareness;Decreased knowledge of use of DME or AE;Decreased knowledge of precautions;Pain;Impaired UE functional use      OT Treatment/Interventions: Self-care/ADL training;DME and/or AE instruction;Therapeutic activities;Patient/family education;Balance training;Therapeutic exercise;Neuromuscular education;Energy conservation;Manual therapy;Cognitive remediation/compensation;Visual/perceptual remediation/compensation    OT Goals(Current goals can be found in the care plan section) Acute Rehab OT Goals Patient Stated Goal: to feel better OT Goal Formulation: With patient Time For Goal Achievement: 10/03/17 Potential to Achieve Goals: Good ADL Goals Pt Will Perform Eating: with modified independence;sitting Pt Will Perform Grooming: with modified independence;sitting Pt Will Perform Upper Body Dressing: with modified independence;sitting Pt Will Perform Lower Body Dressing: with min assist;sit to/from stand Pt Will Transfer to Toilet: with min guard assist;ambulating;bedside  commode(BSC over toilet) Pt Will Perform Toileting - Clothing Manipulation and hygiene: with min assist;sit to/from stand Additional ADL Goal #1: Pt will demonstrate selective attention to seated grooming tasks.  OT Frequency: Min 2X/week   Barriers to D/C:  Decreased caregiver support          Co-evaluation              AM-PAC PT "6 Clicks" Daily Activity     Outcome Measure Help from another person eating meals?: A Little Help from another person taking care of personal grooming?: A Little Help from another person toileting, which includes using toliet, bedpan, or urinal?: A Lot Help from another person bathing (including washing, rinsing, drying)?: A Lot Help from another person to put on and taking off regular upper body clothing?: A Lot Help from another person to put on and taking off regular lower body clothing?: A Lot 6 Click Score: 14   End of Session Equipment Utilized During Treatment: Surveyor, mining Communication: Mobility status  Activity Tolerance: Patient tolerated treatment well Patient left: in bed;with call bell/phone within reach;with nursing/sitter in room;with bed alarm set  OT Visit Diagnosis: Unsteadiness on feet (R26.81);Pain;Other symptoms and signs involving cognitive function;Muscle weakness (generalized) (M62.81) Pain - part of body: (back)                Time: 7096-2836 OT Time Calculation (min): 24 min Charges:  OT General Charges $OT Visit: 1 Visit OT Evaluation $OT Eval Moderate Complexity: 1 Mod OT Treatments $Self Care/Home Management : 8-22 mins G-Codes:     Norman Herrlich, MS OTR/L  Pager: [place] A Jaston Havens 09/19/2017, 9:32 AM

## 2017-09-19 NOTE — Evaluation (Signed)
Physical Therapy Evaluation Patient Details Name: Joe Shah MRN: [id number] DOB: 1941/07/29 Today's Date: 09/19/2017   History of Present Illness  Pt is a 77 y.o. male with a history of hypertension, hyperlipidemia, aortic stenosis s/p porcine replacement in 2013 by Dr. 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 He presented from home after falling back on his bed, becoming rigit, and having an increase in confusion. MRI revealing scattered small acute infarcts throughout cerebral and cerebellar hemispheres with concern for watershed vs cardioembolic infarcts.   Clinical Impression  Pt admitted with above diagnosis. Pt currently with functional limitations due to the deficits listed below (see PT Problem List).PTA pt limited in mobility by back injury from prior fall, and care giver to wife however cognitively intact per daughter. Pt currently limited in safe mobility by decreased cognition and adherence to spinal precautions, along with decreased strength and balance. Pt is currently modA for bed mobility, transfers and stand step transfer from bed to recliner. PT recommends SNF level rehab at discharge to increased strength and balance for safe mobility before going home. PT will continue to follow acutely to increase their independence and safety with mobility until d/c.     Follow Up Recommendations SNF    Equipment Recommendations  None recommended by PT       Precautions / Restrictions Precautions Precautions: Fall;Back Precaution Comments: educated for back precautions with family Restrictions Weight Bearing Restrictions: No      Mobility  Bed Mobility Overal bed mobility: Needs Assistance Bed Mobility: Rolling;Sidelying to Sit;Sit to Sidelying Rolling: Min  assist Sidelying to sit: Mod assist     Sit to sidelying: Min assist General bed mobility comments: Mod assist to power trunk from [facility], increase in pain once in seated  Transfers Overall transfer level: Needs assistance Equipment used: Rolling walker (2 wheeled) Transfers: Sit to/from Stand Sit to Stand: Mod assist Stand pivot transfers: Mod assist       General transfer comment: modA to power up because hips were too far back in bed, vc for scooting hips forward to EoB before beginning powerup but did not follow, once in standing pt attempted to lean forward with forearms on RW while he urinated, vc for upright posture and hand placement on handles of RW, once finished relieving himself returned to upright and required modA for steadying with RW to step transfer to recliner,      Balance Overall balance assessment: Needs assistance Sitting-balance support: No upper extremity supported;Feet supported Sitting balance-Leahy Scale: Fair Sitting balance - Comments: Leaning forward and unable to maintain straight back. vc for back precautions   Standing balance support: During functional activity;Bilateral upper extremity supported Standing balance-Leahy Scale: Poor Standing balance comment: Required assistance to maintain standing.                             Pertinent Vitals/Pain Pain Assessment: Faces Faces Pain Scale: Hurts even more Pain Location: back Pain Descriptors / Indicators: Grimacing Pain Intervention(s): Limited activity within patient's tolerance;Monitored during session;Repositioned    Home Living Family/patient expects to be discharged to:: Skilled nursing facility Living Arrangements: Spouse/significant other Available Help at Discharge: Family;Available PRN/intermittently Type of Home: House Home Access: Ramped entrance     Home Layout: One level Home Equipment: Walker - 2 wheels;Shower seat  Prior Function Level of Independence: Needs  assistance   Gait / Transfers Assistance Needed: Has been using RW or cane since back injury  ADL's / Homemaking Assistance Needed: Was independent prior to his back injury. However, has required assistance since then.  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 Per daughter wife is unable to provide any assistance.      Hand Dominance   Dominant Hand: Right    Extremity/Trunk Assessment   Upper Extremity Assessment Upper Extremity Assessment: Defer to OT evaluation RUE Deficits / Details: Decreased R shoulder AROM at baseline due to previous injury.    Lower Extremity Assessment Lower Extremity Assessment: Generalized weakness       Communication   Communication: HOH  Cognition Arousal/Alertness: Lethargic Behavior During Therapy: WFL for tasks assessed/performed Overall Cognitive Status: Impaired/Different from baseline Area of Impairment: Following commands;Safety/judgement;Attention;Orientation;Memory;Awareness;Problem solving                 Orientation Level: Disoriented to;Place;Time;Situation Current Attention Level: Sustained Memory: Decreased short-term memory;Decreased recall of precautions Following Commands: Follows one step commands consistently;Follows multi-step commands inconsistently Safety/Judgement: Decreased awareness of safety Awareness: Emergent Problem Solving: Slow processing;Decreased initiation;Difficulty sequencing;Requires verbal cues;Requires tactile cues General Comments: Pt able to recall being married to wife for 42 years, however states he is at [facility] in [place] when asked place and unsure of reason for being in hospital      General Comments General comments (skin integrity, edema, etc.): Daughter, son-in law and grandaughter present during eval, Expressed concern for level of cognitive decline        Assessment/Plan    PT Assessment Patient needs continued PT services  PT Problem List Decreased strength;Decreased  range of motion;Decreased activity tolerance;Decreased balance;Decreased mobility;Decreased cognition;Decreased safety awareness;Decreased knowledge of precautions;Pain       PT Treatment Interventions DME instruction;Gait training;Functional mobility training;Stair training;Therapeutic activities;Therapeutic exercise;Balance training;Neuromuscular re-education;Cognitive remediation;Patient/family education    PT Goals (Current goals can be found in the Care Plan section)  Acute Rehab PT Goals Patient Stated Goal: to feel better PT Goal Formulation: With patient/family Time For Goal Achievement: 10/03/17 Potential to Achieve Goals: Fair    Frequency Min 4X/week   Barriers to discharge Decreased caregiver support         AM-PAC PT "6 Clicks" Daily Activity  Outcome Measure Difficulty turning over in bed (including adjusting bedclothes, sheets and blankets)?: A Little Difficulty moving from lying on back to sitting on the side of the bed? : Unable Difficulty sitting down on and standing up from a chair with arms (e.g., wheelchair, bedside commode, etc,.)?: Unable Help needed moving to and from a bed to chair (including a wheelchair)?: A Lot Help needed walking in hospital room?: Total Help needed climbing 3-5 steps with a railing? : Total 6 Click Score: 9    End of Session Equipment Utilized During Treatment: Gait belt Activity Tolerance: Patient limited by pain Patient left: in chair;with call bell/phone within reach;with family/visitor present;with chair alarm set Nurse Communication: Mobility status PT Visit Diagnosis: Unsteadiness on feet (R26.81);Other abnormalities of gait and mobility (R26.89);Difficulty in walking, not elsewhere classified (R26.2);Other symptoms and signs involving the nervous system (R29.898);Pain Pain - part of body: (back)    Time: 5102-5852 PT Time Calculation (min) (ACUTE ONLY): 23 min   Charges:   PT Evaluation $PT Eval Moderate Complexity: 1  Mod PT Treatments $Therapeutic Activity: 8-22 mins   PT G Codes:        Desyre Calma B. [employer] PT,  DPT Acute Rehabilitation  [phone] Pager [phone]    [place] 09/19/2017, 11:41 AM

## 2017-09-19 NOTE — Progress Notes (Signed)
Call from nursing supervisor at [facility] ED that the Pt urine culture came back positive. Paged [facility] urine culture with gram positive cocci.

## 2017-09-19 NOTE — Progress Notes (Signed)
Pharmacy Antibiotic Note  Joe Shah is a 77 y.o. male admitted on 09/30/2017 with bacteremia.  Pharmacy has been consulted for daptomycin dosing.  Patient with enterococcus on BCID- vancomycin resistance not detected. Per discussion with Dr. Tommy Medal, will cover with ampicillin and daptomycin until speciation/sensitivity results since patient likely has septic emboli.  Plan: Ampicillin 2g IV q6h Daptomycin 600mg  (8mg /kg) IV q24h Follow c/s, clinical progression, renal function, CK qMonday  Height: 6' (182.9 cm) Weight: 166 lb 0.1 oz (75.3 kg) IBW/kg (Calculated) : 77.6  Temp (24hrs), Avg:99.3 F (37.4 C), Min:97.4 F (36.3 C), Max:101.9 F (38.8 C)  Recent Labs  Lab 09/12/2017 1102 09/03/2017 1116 09/08/2017 1319  WBC 19.9*  --   --   CREATININE 1.55*  --   --   LATICACIDVEN  --  2.14* 1.60    Estimated Creatinine Clearance: 43.2 mL/min (A) (by C-G formula based on SCr of 1.55 mg/dL (H)).    No Known Allergies  Antimicrobials this admission: Ampicillin 2/17 >>  Daptomycin 2/17 >>   Dose adjustments this admission: n/a  Microbiology results: 2/16 BCx: GPC 2/16 BCID: enterococcus 2/16 UCx:     Thank you for allowing pharmacy to be a part of this patient's care.  Valery Chance D. Euriah Matlack, PharmD, BCPS Clinical Pharmacist Clinical Phone for 09/19/2017 until 3:30pm: [phone] If after 3:30pm, please call main pharmacy at [phone] 09/19/2017 11:18 AM

## 2017-09-19 NOTE — Progress Notes (Signed)
[facility]                                                                              Patient Demographics  Joe Shah, is a 77 y.o. male, DOB - 1940-09-06, LPF:[id number]  Admit date - 09/24/2017   Admitting Physician Joe Mainland, DO  Outpatient Primary MD for the patient is Joe Graves, DO  Outpatient specialists:   LOS - 1  days   Medical records reviewed and are as summarized below:    Chief Complaint  Patient presents with  . Loss of Consciousness       Brief summary   Admit note by Joe Shah on 2/16:   Joe Shah is a 77 y.o. male with a history of htn, hlp, AS s/p porcine valve replacement in 2013 by Joe Shah, paroxysmal afib following his valve replacement surgery with no apparent recurrence, spinal stenosis in the lumbar spine with cauda equina compression with recent hospitalization from 2/2 until 2/6.  Patient was discharged to home on Medrol Dosepak, which he completed on 2/13 and supposed to follow with Joe Shah neurosurgery in 2 weeks.   On the morning of admission 09/16/2017, his son was helping him get dressed, with the patient fell back on the bed became extremely rigid, lasted for ~ 45-90 seconds, followed by 5 minutes of confusion and disorientation.  Son reported no generalized shaking or rhythmic movements.  There is no prior history of seizures, head injury, stroke. There was a concern whether the patient has been having chills over the past couple of days.     Assessment & Plan    Principal Problem: Acute embolic stroke - Presented with confusion, right arm weakness following the syncopal versus seizure episode - MRI brain showed multiple bilateral small infarcts in the cerebellar and cerebellum region, predominantly in the watershed territory, could be an cardio embolic given paroxysmal atrial fibrillation,  enterococcus bacteremia - MRA showed patent circle of Willis without proximal brought occlusion of flow  limiting proximal stenosis - Neurology consulted, recommended aspirin 325 mg daily and consider starting anticoagulation, will follow stroke team recommendations - Permissive hypertension, Lipitor 80 mg daily - Follow hemoglobin A1c, lipid profile - Follow 2-D echo, carotid Dopplers - Discussed with Joe Shah given enterococcus bacteremia, will need TEE, consulted with Joe Shah for TEE procedure    Active Problems:   Bacteremia due to enterococcus - Blood cultures positive for enterococcus, sensitivities pending - Unclear source, patient has porcine aortic valve, replacement in 2013 for  - Recent admission for acute back pain, lumbar stenosis 2/2-2/6, reviewed discharge summary by Joe Shah, does not appear to have any interventions done, were discharged on pain medications and Medrol Dosepak - UA negative for UTI, chest x-ray negative for pneumonia - May need CT abdomen for further workup however no complaints of abdominal pain. Called cardiology for TEE. - ID, Joe Shah consulted, started on IV ampicillin, follow recommendations     Mildly elevated troponin - No complaints of chest pain, troponins 2 flat 0.05, likely due to acute stroke and enterococcus bacteremia - Follow 2-D echo  Seizure - Per neurology,  likely syncope however could have a seizure in the setting of embolic infarcts - Recommended EEG, no AEDs at this time     Essential hypertension - BP currently Shah, hold antihypertensive    Aortic valve Replacement with porcine valve - TEE as soon as possible to rule out endocarditis    Paroxysmal atrial fibrillation (HCC) - Currently rate controlled, patient was not on any anticoagulation due to paroxysmal atrial fibrillation after the aortic valve surgery, no further recurrence    CAD (coronary artery disease) - Currently no chest pain, continue aspirin, statin, will follow 2-D echo    Spinal stenosis, lumbar status post recent hospitalization 2/2-2/6 - Denies  any significant worsening pain, completed Medrol Dosepak   Code Status: DO NOT RESUSCITATE DVT Prophylaxis:  Lovenox  Family Communication: Discussed in detail with the patient, all imaging results, lab results explained to the patient    Disposition Plan:  Time Spent in minutes 35 minutes  Procedures:  MRI brain   Consultants:   Neurology   infectious disease Cardiology for TEE  Antimicrobials:   Ampicillin 2/17   Medications  Scheduled Meds: .  stroke: mapping our early stages of recovery book   Does not apply Once  . aspirin  300 mg Rectal Daily   Or  . aspirin  325 mg Oral Daily  . enoxaparin (LOVENOX) injection  40 mg Subcutaneous Q24H  . simvastatin  20 mg Oral QPM  . tamsulosin  0.4 mg Oral Daily   Continuous Infusions: . sodium chloride 75 mL/hr (09/14/2017 1818)  . ampicillin (OMNIPEN) IV Stopped (09/19/17 1005)  . levETIRAcetam     PRN Meds:.acetaminophen, gabapentin, senna-docusate   Antibiotics   Anti-infectives (From admission, onward)   Start     Dose/Rate Route Frequency Ordered Stop   09/19/17 0900  ampicillin (OMNIPEN) 2 g in sodium chloride 0.9 % 100 mL IVPB     2 g 300 mL/hr over 20 Minutes Intravenous Every 6 hours 09/19/17 1610          Subjective:   Joe Shah was seen and examined today.  Somewhat confused, low-grade temp 99.53F. Denies any specific complaints. No seizures this morning.  Patient denies dizziness, chest pain, shortness of breath, abdominal pain, N/V/D/C.  Objective:   Vitals:   09/12/2017 2200 09/19/17 0130 09/19/17 0513 09/19/17 0908  BP: 112/71 122/73 114/73 109/72  Pulse: 94 (!) 114 (!) 103 99  Resp: 20 18 18 18   Temp: 98.1 F (36.7 C) 97.9 F (36.6 C) 99.9 F (37.7 C) 98.5 F (36.9 C)  TempSrc: Oral Oral Axillary Oral  SpO2: 96% 99% 95% 95%  Weight:      Height:        Intake/Output Summary (Last 24 hours) at 09/19/2017 1033 Last data filed at 09/19/2017 0148 Gross per 24 hour  Intake 2782.5 ml    Output 350 ml  Net 2432.5 ml     Wt Readings from Last 3 Encounters:  09/20/2017 75.3 kg (166 lb 0.1 oz)  09/04/17 81.6 kg (180 lb)  09/02/17 81.6 kg (180 lb)     Exam  General: Alert and oriented x self and place, NAD, no dysarthria  Eyes:   HEENT:  Atraumatic, normocephalic  Cardiovascular: S1 S2 auscultated, no rubs, murmurs or gallops. Regular rate and rhythm.  Respiratory: Clear to auscultation bilaterally, no wheezing, rales or rhonchi  Gastrointestinal: Soft, nontender, nondistended, + bowel sounds  Ext: no pedal edema bilaterally  Neuro: strength fairly 5/5 in all extremities  Musculoskeletal: No digital cyanosis, clubbing  Skin: No rashes  Psych: somewhat confused, oriented to self and place   Data Reviewed:  I have personally reviewed following labs and imaging studies  Micro Results Recent Results (from the past 240 hour(s))  Blood culture (routine x 2)     Status: None (Preliminary result)   Collection Time: 09/12/2017 11:40 AM  Result Value Ref Range Status   Specimen Description   Final    BLOOD LEFT ANTECUBITAL BOTTLES DRAWN AEROBIC AND ANAEROBIC Performed at [facility], [address], [place], [place] [address]    Special Requests   Final    Blood Culture adequate volume Performed at [facility], [address] [place], [place] [address]    Culture  Setup Time   Final    GRAM POSITIVE COCCI Gram Stain Report Called to,Read Back By and Verified With: LURZ,G @ 0248 ON 09/19/17 BY JUW GS DONE @ [facility] GRAM STAIN REVIEWED-AGREE WITH RESULT 0623 09/19/17 M.CAMPBELL CRITICAL RESULT CALLED TO, READ BACK BY AND VERIFIED WITH: G. ABBOTT, RPHARMD AT 0810 ON 09/19/17 BY C. JESSUP, MLT. Performed at [facility], [place] [address] [place], [place] [address]    Culture GRAM POSITIVE COCCI  Final   Report Status PENDING  Incomplete  Blood Culture ID Panel (Reflexed)     Status: Abnormal   Collection Time: 09/04/2017 11:40 AM  Result Value Ref Range  Status   Enterococcus species DETECTED (A) NOT DETECTED Final    Comment: CRITICAL RESULT CALLED TO, READ BACK BY AND VERIFIED WITH: G. ABBOTT, RPHARMD AT 0810 ON 09/19/17 BY C. JESSUP, MLT.    Vancomycin resistance NOT DETECTED NOT DETECTED Final   Listeria monocytogenes NOT DETECTED NOT DETECTED Final   Staphylococcus species NOT DETECTED NOT DETECTED Final   Staphylococcus aureus NOT DETECTED NOT DETECTED Final   Streptococcus species NOT DETECTED NOT DETECTED Final   Streptococcus agalactiae NOT DETECTED NOT DETECTED Final   Streptococcus pneumoniae NOT DETECTED NOT DETECTED Final   Streptococcus pyogenes NOT DETECTED NOT DETECTED Final   Acinetobacter baumannii NOT DETECTED NOT DETECTED Final   Enterobacteriaceae species NOT DETECTED NOT DETECTED Final   Enterobacter cloacae complex NOT DETECTED NOT DETECTED Final   Escherichia coli NOT DETECTED NOT DETECTED Final   Klebsiella oxytoca NOT DETECTED NOT DETECTED Final   Klebsiella pneumoniae NOT DETECTED NOT DETECTED Final   Proteus species NOT DETECTED NOT DETECTED Final   Serratia marcescens NOT DETECTED NOT DETECTED Final   Haemophilus influenzae NOT DETECTED NOT DETECTED Final   Neisseria meningitidis NOT DETECTED NOT DETECTED Final   Pseudomonas aeruginosa NOT DETECTED NOT DETECTED Final   Candida albicans NOT DETECTED NOT DETECTED Final   Candida glabrata NOT DETECTED NOT DETECTED Final   Candida krusei NOT DETECTED NOT DETECTED Final   Candida parapsilosis NOT DETECTED NOT DETECTED Final   Candida tropicalis NOT DETECTED NOT DETECTED Final    Comment: Performed at [facility], [place] [address], [place], [place] [address]  Blood culture (routine x 2)     Status: None (Preliminary result)   Collection Time: 09/17/2017 11:49 AM  Result Value Ref Range Status   Specimen Description   Final    BLOOD LEFT HAND BOTTLES DRAWN AEROBIC AND ANAEROBIC Performed at [facility], [address] [place], [place] [address]    Special  Requests   Final    Blood Culture results may not be optimal due to an inadequate volume of blood received in culture bottles  Performed at [facility], [address], [place], [place] [address]    Culture  Setup Time   Final    GRAM POSITIVE COCCI Gram Stain Report Called to,Read Back By and Verified With: LURZ,G @ 6045 ON 09/19/17 BY JUW GS DONE @ [facility] GRAM STAIN [place] WITH RESULT 0619 09/19/17 M.CAMPBELL Performed at [facility], [place] [address], [place], [place] [address]    Culture   Final    NO GROWTH < 24 HOURS Performed at [facility], [address] [place], [place] [address]    Report Status PENDING  Incomplete    Radiology Reports Dg Chest 2 View  Result Date: 09/04/2017 CLINICAL DATA:  Cough, fatigue EXAM: CHEST  2 VIEW COMPARISON:  09/10/2011 FINDINGS: There are low lung volumes. There is no focal parenchymal opacity. There is no pleural effusion or pneumothorax. The heart and mediastinal contours are unremarkable. 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 IMPRESSION: No active cardiopulmonary disease. Electronically Signed   By: Kathreen Devoid   On: 09/04/2017 17:03   Dg Lumbar Spine Complete  Result Date: 09/02/2017 CLINICAL DATA:  Fall this morning with bilateral leg weakness and low back pain. EXAM: LUMBAR SPINE - COMPLETE 4+ VIEW COMPARISON:  KUB 01/28/2016 and lumbar spine 11/29/2009 FINDINGS: Moderate curvature of the lower thoracic/lumbar spine convex right without significant change. Moderate spondylosis throughout the lumbar spine. Moderate disc disease at all levels of the lumbar spine without significant change. Grade 2 anterolisthesis of L3 on L4 with interval worsening. This is likely due to the severe facet arthropathy over the mid to lower lumbar spine. No definite compression fracture. Moderate calcified plaque over the abdominal aorta. IMPRESSION: Moderate spondylosis of the lumbar spine with  moderate multilevel disc disease throughout the lumbar spine. Interval progression of grade 2 anterolisthesis of L3 on L4 likely due to severe facet arthropathy. Electronically Signed   By: Marin Olp M.D.   On: 09/02/2017 21:06   Ct Head Wo Contrast  Result Date: 09/24/2017 CLINICAL DATA:  Altered level of consciousness. Fall 2 weeks ago. Near syncope today. EXAM: CT HEAD WITHOUT CONTRAST TECHNIQUE: Contiguous axial images were obtained from the base of the skull through the vertex without intravenous contrast. COMPARISON:  None. FINDINGS: Brain: Mild atrophy. Negative for hydrocephalus. Negative for acute infarct. Mild chronic white matter changes. Subtle hyper density in the right parietal periventricular white matter measuring approximately 7 mm. This does not appear to represent acute hemorrhage but could be subacute hemorrhage or iron deposition from cavernoma which is favored. Hyperdense tumor is also a possibility. Vascular: Atherosclerotic calcification. Negative for acute vascular thrombosis. Skull: Negative Sinuses/Orbits: Negative Other: None IMPRESSION: Mild atrophy and chronic microvascular ischemia in the white matter. No acute infarct. 7 mm subtle hyperdensity in the right parietal periventricular white matter, favor chronic cavernoma. Subacute hemorrhage and hyperdense tumor are possible. Therefore, MRI brain without with contrast recommended for further evaluation. Electronically Signed   By: Franchot Gallo M.D.   On: 09/13/2017 12:02   Ct Angio Neck W Or Wo Contrast  Result Date: 09/17/2017 CLINICAL DATA:  Syncopal/near syncopal event versus seizure. Scattered bilateral cerebral and cerebellar infarcts on MRI. EXAM: CT ANGIOGRAPHY NECK TECHNIQUE: Multidetector CT imaging of the neck was performed using the standard protocol during bolus administration of intravenous contrast. Multiplanar CT image reconstructions and MIPs were obtained to evaluate the vascular anatomy. Carotid stenosis  measurements (when applicable) are obtained utilizing NASCET criteria, using the  distal internal carotid diameter as the denominator. CONTRAST:  65 mL Isovue 370 COMPARISON:  None. FINDINGS: Aortic arch: Standard 3 vessel aortic arch with mild atherosclerotic plaque. Widely patent brachiocephalic and subclavian arteries with scattered non stenotic plaque. Right carotid system: Patent without evidence of stenosis or dissection. Tortuous mid to distal cervical ICA. Left carotid system: Patent without evidence of significant stenosis or dissection. Mild-to-moderate predominantly calcified plaque in the proximal ICA. Tortuous distal cervical ICA. Vertebral arteries: Patent without evidence of significant stenosis or dissection. Mildly dominant left vertebral artery. Nonstenotic calcified plaque at the right vertebral artery origin. Skeleton: Severe right glenohumeral arthropathy. Severe diffuse cervical disc and right-sided facet degeneration. Other neck: Prior right thyroidectomy. Enlarged and heterogeneous left thyroid lobe with an underlying 4.5 cm mass which laterally deviates the common carotid artery and internal jugular vein and slightly deviates the trachea. Upper chest: Mild centrilobular and paraseptal emphysema. IMPRESSION: 1. Patent cervical carotid and vertebral arteries without stenosis or dissection. Nonstenotic plaque in the left ICA. 2.  Aortic Atherosclerosis (ICD10-I70.0). 3. Prior right thyroidectomy. 4.5 cm left thyroid mass. Recommend correlation with presumed prior outside thyroid imaging. Electronically Signed   By: Logan Bores M.D.   On: 09/26/2017 18:18   Ct Lumbar Spine Wo Contrast  Result Date: 09/13/2017 CLINICAL DATA:  Back pain.  Fall 2 weeks ago. EXAM: CT LUMBAR SPINE WITHOUT CONTRAST TECHNIQUE: Multidetector CT imaging of the lumbar spine was performed without intravenous contrast administration. Multiplanar CT image reconstructions were also generated. COMPARISON:  CT lumbar  09/04/2017, MRI lumbar 09/04/2017 FINDINGS: Segmentation: Normal Alignment: Moderate lumbar scoliosis unchanged. 5 mm anterolisthesis L3-4 unchanged. Vertebrae: Negative for fracture or mass Paraspinal and other soft tissues: Atherosclerotic calcification aorta and iliacs. No retroperitoneal mass. Disc levels: T12-L1: Moderate disc and facet degeneration without significant stenosis L1-2: Moderate disc and facet degeneration. Left foraminal narrowing due to spurring. L2-3: Advanced disc degeneration and spurring on the left causing left foraminal encroachment unchanged. Bilateral facet hypertrophy. Mild narrowing of the canal. Mild right foraminal narrowing. L3-4: 5 mm anterolisthesis with severe facet hypertrophy. Remote laminectomy. Severe facet hypertrophy bilaterally contributing to moderate to severe spinal stenosis. Severe subarticular stenosis bilaterally unchanged. L4-5: Disc degeneration and spurring right greater than left. Severe facet degeneration. Remote laminectomy. Marked bony overgrowth of the facet joints contributing to moderate spinal stenosis. Marked subarticular stenosis bilaterally. L5-S1: Disc degeneration and spurring. Severe facet degeneration. Asymmetric soft tissue in the right foramen suspicious for right foraminal disc protrusion. Correlate with right L5 radicular pain. Severe foraminal encroachment bilaterally due to spurring and disc. IMPRESSION: Advanced multilevel disc and facet degeneration throughout the lumbar spine with moderate scoliosis. Negative for fracture No significant change from prior studies. Electronically Signed   By: Franchot Gallo M.D.   On: 09/13/2017 11:58   Ct Lumbar Spine Wo Contrast  Result Date: 09/04/2017 CLINICAL DATA:  Witnessed fall Friday.  Not ambulatory. EXAM: CT LUMBAR SPINE WITHOUT CONTRAST TECHNIQUE: Multidetector CT imaging of the lumbar spine was performed without intravenous contrast administration. Multiplanar CT image reconstructions were  also generated. COMPARISON:  Plain films 09/02/2017. FINDINGS: Segmentation: Standard. Alignment: Degenerative scoliosis convex RIGHT thoracolumbar junction. Mild anterolisthesis L3 on L4, 3 mm. Vertebrae: No acute fracture or focal pathologic process. Paraspinal and other soft tissues: Negative. Aortic atherosclerosis. Marked bladder distention. There appears to be marked prostate enlargement. Disc levels: L1-L2: Advanced disc space narrowing with osseous spurring. Vacuum phenomenon. LEFT subarticular zone and foraminal zone narrowing. L2-L3: Moderate disc space narrowing. Osseous spurring. Facet arthropathy.  Mild stenosis. No definite subarticular zone narrowing. Significant foraminal narrowing on the LEFT. L3-L4: Moderate disc space narrowing. 3 mm anterolisthesis. Calcified protrusion in the midline. Marked posterior element hypertrophy, primarily facet overgrowth. Severe stenosis. RIGHT greater than LEFT L4 nerve root impingement due to subarticular zone. The RIGHT L3 nerve root may be compressed in the foramen. L4-L5: Severe disc space narrowing with vacuum phenomenon. Osseous spurring. Advanced posterior element hypertrophy. Mild to moderate stenosis. RIGHT sided foraminal zone narrowing could affect the L4 nerve root. L5-S1: Moderate to advanced disc space narrowing. Facet arthropathy. No stenosis or subarticular zone narrowing, but LEFT-sided foraminal narrowing could affect the L5 nerve root. IMPRESSION: Multilevel spondylosis, with varying degrees of stenosis, subarticular zone narrowing and foraminal zone narrowing. 3 mm anterolisthesis L3-4 appears facet mediated. There is no significant slip to suggest ligamentous injury. No visible lower thoracic or lumbar spine compression fracture is evident. Aortic Atherosclerosis (ICD10-I70.0). Marked bladder distention, query bladder outlet obstruction Electronically Signed   By: Staci Righter M.D.   On: 09/04/2017 16:54   Mr Jodene Nam Head Wo Contrast  Result  Date: 09/29/2017 CLINICAL DATA:  Near syncopal event today. Fall 2 weeks ago. Orthostatic in emergency department. EXAM: MRI HEAD WITHOUT AND WITH CONTRAST MRA HEAD WITHOUT CONTRAST TECHNIQUE: Multiplanar, multiecho pulse sequences of the brain and surrounding structures were obtained without and with intravenous contrast. Angiographic images of the head were obtained using MRA technique without contrast. CONTRAST:  26mL MULTIHANCE GADOBENATE DIMEGLUMINE 529 MG/ML IV SOLN COMPARISON:  Head CT 09/10/2017 FINDINGS: MRI HEAD FINDINGS Multiple sequences are mildly to moderately motion degraded. Brain: There are numerous subcentimeter foci of acute infarction scattered throughout both cerebral hemispheres as well as left and possibly right cerebellar hemispheres. The there is cortical involvement in the frontal, parietal, and occipital lobes bilaterally, and there is also involvement of the left centrum semiovale. There is a small chronic infarct in the right cerebellum. Scattered T2 hyperintensities in the cerebral white matter and pons are nonspecific but compatible with mild chronic small vessel ischemic disease. There is mild-to-moderate cerebral atrophy. A 5 mm T2 hyperintense focus with prominent susceptibility artifact in the right parieto-occipital white matter corresponds to the subtle hyperattenuation on CT and is without associated enhancement or edema. Chronic microhemorrhages are noted in the right cerebellum and right parietal lobe. Vascular: Major intracranial vascular flow voids are preserved. Skull and upper cervical spine: Unremarkable bone marrow signal. Sinuses/Orbits: Unremarkable orbits. Paranasal sinuses and mastoid air cells are clear. Other: None. MRA HEAD FINDINGS There is intermittent moderate motion artifact. The visualized distal vertebral arteries are patent to the basilar with the left being mildly dominant. Patent left PICA and bilateral SCA origins are identified. The basilar artery is  widely patent and tortuous. Posterior communicating arteries are not identified. PCAs are patent without evidence of significant proximal stenosis. The internal carotid arteries are patent from skull base to carotid termini without evidence of significant stenosis. ACAs and MCAs are patent without evidence of proximal branch occlusion, A1 stenosis, or M1 stenosis. There is artifactual signal loss in bilateral M2 greater than A2 branches which limits assessment. No aneurysm is identified. IMPRESSION: 1. 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 2. Subcentimeter focus of chronic hemorrhage in the right parieto-occipital white matter compatible with a cavernoma. 3. Mild chronic small vessel ischemic disease and mild-to-moderate cerebral atrophy. 4. Patent circle of Willis without proximal branch occlusion or flow limiting proximal stenosis. Electronically Signed   By: Zenia Resides  Jeralyn Ruths M.D.   On: 09/04/2017 15:26   Mr Brain W And Wo Contrast  Result Date: 09/24/2017 CLINICAL DATA:  Near syncopal event today. Fall 2 weeks ago. Orthostatic in emergency department. EXAM: MRI HEAD WITHOUT AND WITH CONTRAST MRA HEAD WITHOUT CONTRAST TECHNIQUE: Multiplanar, multiecho pulse sequences of the brain and surrounding structures were obtained without and with intravenous contrast. Angiographic images of the head were obtained using MRA technique without contrast. CONTRAST:  41mL MULTIHANCE GADOBENATE DIMEGLUMINE 529 MG/ML IV SOLN COMPARISON:  Head CT 09/16/2017 FINDINGS: MRI HEAD FINDINGS Multiple sequences are mildly to moderately motion degraded. Brain: There are numerous subcentimeter foci of acute infarction scattered throughout both cerebral hemispheres as well as left and possibly right cerebellar hemispheres. The there is cortical involvement in the frontal, parietal, and occipital lobes bilaterally, and there is also involvement of the left centrum  semiovale. There is a small chronic infarct in the right cerebellum. Scattered T2 hyperintensities in the cerebral white matter and pons are nonspecific but compatible with mild chronic small vessel ischemic disease. There is mild-to-moderate cerebral atrophy. A 5 mm T2 hyperintense focus with prominent susceptibility artifact in the right parieto-occipital white matter corresponds to the subtle hyperattenuation on CT and is without associated enhancement or edema. Chronic microhemorrhages are noted in the right cerebellum and right parietal lobe. Vascular: Major intracranial vascular flow voids are preserved. Skull and upper cervical spine: Unremarkable bone marrow signal. Sinuses/Orbits: Unremarkable orbits. Paranasal sinuses and mastoid air cells are clear. Other: None. MRA HEAD FINDINGS There is intermittent moderate motion artifact. The visualized distal vertebral arteries are patent to the basilar with the left being mildly dominant. Patent left PICA and bilateral SCA origins are identified. The basilar artery is widely patent and tortuous. Posterior communicating arteries are not identified. PCAs are patent without evidence of significant proximal stenosis. The internal carotid arteries are patent from skull base to carotid termini without evidence of significant stenosis. ACAs and MCAs are patent without evidence of proximal branch occlusion, A1 stenosis, or M1 stenosis. There is artifactual signal loss in bilateral M2 greater than A2 branches which limits assessment. No aneurysm is identified. IMPRESSION: 1. 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 2. Subcentimeter focus of chronic hemorrhage in the right parieto-occipital white matter compatible with a cavernoma. 3. Mild chronic small vessel ischemic disease and mild-to-moderate cerebral atrophy. 4. Patent circle of Willis without proximal branch occlusion or flow limiting  proximal stenosis. Electronically Signed   By: Logan Bores M.D.   On: 09/19/2017 15:26   Mr Thoracic Spine Wo Contrast  Result Date: 09/04/2017 CLINICAL DATA:  Patient fell 09/02/2017. Severe back pain. Difficult to ambulate. EXAM: MRI THORACIC AND LUMBAR SPINE WITHOUT CONTRAST TECHNIQUE: Multiplanar and multiecho pulse sequences of the thoracic and lumbar spine were obtained without intravenous contrast. COMPARISON:  Plain films 09/02/2017. CT lumbar spine earlier today. FINDINGS: MRI THORACIC SPINE FINDINGS Alignment:  Physiologic. Vertebrae: No acute compression fracture or worrisome osseous lesion. Cord:  No significant cord compression or abnormal cord signal. Paraspinal and other soft tissues: Large goiter, substernal nodule extending on the LEFT. Median sternotomy. Gallstone. RIGHT pleural effusion. RIGHT hepatic cyst, approximate 1 x 2 cm. Disc levels: At T7-8 there is a LEFT paracentral protrusion. This does not result in stenosis or cord flattening. At T8-9 there is a central protrusion associated with posterior element hypertrophy. The T8-9 disc is mildly hyperintense, but there are no signs of infection. There is a RIGHT-sided facet joint  effusion associated with asymmetric bony overgrowth. At T10-11, there is a shallow central protrusion with borderline stenosis. At T11-12, there is asymmetric facet arthropathy on the LEFT. MRI LUMBAR SPINE FINDINGS Segmentation:  Standard. Alignment: 3 mm anterolisthesis L3-4 is facet mediated there is degenerative scoliosis convex RIGHT at the thoracolumbar junction, approximately 30 degrees. Vertebrae: No compression fracture or worrisome osseous lesion. Endplate reactive changes. Conus medullaris and cauda equina: Conus extends to the L1-L2 level. The conus appears normal. The cauda equina is compressed and displaced at multiple levels, which appears to be due to a combination of spondylosis and scoliosis. There is some areas, such as opposite L4 (series 6,  image 22) where the CSF has an appearance mimicking a subdural effusion or hygroma, there are no visible signs of intraspinal hematoma. Paraspinal and other soft tissues: The bladder is no longer distended. There is no retroperitoneal adenopathy. Disc levels: L1-L2: Disc space narrowing. Shallow central protrusion. Asymmetric facet arthropathy on the LEFT. Foraminal narrowing could affect the LEFT L1 nerve root. L2-L3: Disc space narrowing. Annular bulge with osseous spurring. Posterior element hypertrophy. Moderate stenosis. BILATERAL L2 and L3 nerve root impingement are possible. L3-L4: Disc space narrowing. 3 mm anterolisthesis. Central protrusion. Posterior element hypertrophy. Severe stenosis. BILATERAL L4 nerve root impingement in the canal. RIGHT L3 nerve root impingement in the foramen. Interspinous bursa edema, consistent with Baastrup's disease. L4-L5: Disc space narrowing osseous spurring. Annular bulge. Posterior element hypertrophy. Moderate stenosis. RIGHT greater than LEFT L5 nerve root impingement. L5-S1: Disc hyperintensity without signs of infection. Central protrusion. Posterior element hypertrophy. Mild stenosis. BILATERAL subarticular zone and foraminal zone narrowing affecting the L5 and S1 nerve roots. IMPRESSION: MR THORACIC SPINE IMPRESSION No thoracic spine compression fracture or visible hematoma. Multilevel spondylosis, worst at T8-9. mild stenosis at this level without thoracic cord compression. MR LUMBAR SPINE IMPRESSION No lumbar spine compression fracture or visible hematoma. Mild anterolisthesis at L3-4 is degenerative, not posttraumatic. Severe spinal stenosis at L3-4 relates to slip, posterior element hypertrophy, and central protrusion, affecting the L3 and L4 nerve roots. See discussion above. Moderate stenosis at L4-5 and L5-S1, related to disc space narrowing, bony overgrowth, disc material, and posterior element hypertrophy. Electronically Signed   By: Staci Righter M.D.   On:  09/04/2017 19:53   Mr Lumbar Spine Wo Contrast  Result Date: 09/04/2017 CLINICAL DATA:  Patient fell 09/02/2017. Severe back pain. Difficult to ambulate. EXAM: MRI THORACIC AND LUMBAR SPINE WITHOUT CONTRAST TECHNIQUE: Multiplanar and multiecho pulse sequences of the thoracic and lumbar spine were obtained without intravenous contrast. COMPARISON:  Plain films 09/02/2017. CT lumbar spine earlier today. FINDINGS: MRI THORACIC SPINE FINDINGS Alignment:  Physiologic. Vertebrae: No acute compression fracture or worrisome osseous lesion. Cord:  No significant cord compression or abnormal cord signal. Paraspinal and other soft tissues: Large goiter, substernal nodule extending on the LEFT. Median sternotomy. Gallstone. RIGHT pleural effusion. RIGHT hepatic cyst, approximate 1 x 2 cm. Disc levels: At T7-8 there is a LEFT paracentral protrusion. This does not result in stenosis or cord flattening. At T8-9 there is a central protrusion associated with posterior element hypertrophy. The T8-9 disc is mildly hyperintense, but there are no signs of infection. There is a RIGHT-sided facet joint effusion associated with asymmetric bony overgrowth. At T10-11, there is a shallow central protrusion with borderline stenosis. At T11-12, there is asymmetric facet arthropathy on the LEFT. MRI LUMBAR SPINE FINDINGS Segmentation:  Standard. Alignment: 3 mm anterolisthesis L3-4 is facet mediated there is degenerative scoliosis convex RIGHT  at the thoracolumbar junction, approximately 30 degrees. Vertebrae: No compression fracture or worrisome osseous lesion. Endplate reactive changes. Conus medullaris and cauda equina: Conus extends to the L1-L2 level. The conus appears normal. The cauda equina is compressed and displaced at multiple levels, which appears to be due to a combination of spondylosis and scoliosis. There is some areas, such as opposite L4 (series 6, image 22) where the CSF has an appearance mimicking a subdural effusion or  hygroma, there are no visible signs of intraspinal hematoma. Paraspinal and other soft tissues: The bladder is no longer distended. There is no retroperitoneal adenopathy. Disc levels: L1-L2: Disc space narrowing. Shallow central protrusion. Asymmetric facet arthropathy on the LEFT. Foraminal narrowing could affect the LEFT L1 nerve root. L2-L3: Disc space narrowing. Annular bulge with osseous spurring. Posterior element hypertrophy. Moderate stenosis. BILATERAL L2 and L3 nerve root impingement are possible. L3-L4: Disc space narrowing. 3 mm anterolisthesis. Central protrusion. Posterior element hypertrophy. Severe stenosis. BILATERAL L4 nerve root impingement in the canal. RIGHT L3 nerve root impingement in the foramen. Interspinous bursa edema, consistent with Baastrup's disease. L4-L5: Disc space narrowing osseous spurring. Annular bulge. Posterior element hypertrophy. Moderate stenosis. RIGHT greater than LEFT L5 nerve root impingement. L5-S1: Disc hyperintensity without signs of infection. Central protrusion. Posterior element hypertrophy. Mild stenosis. BILATERAL subarticular zone and foraminal zone narrowing affecting the L5 and S1 nerve roots. IMPRESSION: MR THORACIC SPINE IMPRESSION No thoracic spine compression fracture or visible hematoma. Multilevel spondylosis, worst at T8-9. mild stenosis at this level without thoracic cord compression. MR LUMBAR SPINE IMPRESSION No lumbar spine compression fracture or visible hematoma. Mild anterolisthesis at L3-4 is degenerative, not posttraumatic. Severe spinal stenosis at L3-4 relates to slip, posterior element hypertrophy, and central protrusion, affecting the L3 and L4 nerve roots. See discussion above. Moderate stenosis at L4-5 and L5-S1, related to disc space narrowing, bony overgrowth, disc material, and posterior element hypertrophy. Electronically Signed   By: Staci Righter M.D.   On: 09/04/2017 19:53   Dg Chest Port 1 View  Result Date:  09/03/2017 CLINICAL DATA:  Fever and leukocytosis. EXAM: PORTABLE CHEST 1 VIEW COMPARISON:  09/04/2017 FINDINGS: 1234 hours. The lungs are clear without focal pneumonia, edema, pneumothorax or pleural effusion. The cardiopericardial silhouette is within normal limits for size. Degenerative changes noted in the shoulders bilaterally status post median sternotomy. Telemetry leads overlie the chest. IMPRESSION: Low lung volumes without acute findings. Electronically Signed   By: Misty [place] M.D.   On: 09/14/2017 12:50    Lab Data:  CBC: Recent Labs  Lab 09/14/2017 1102  WBC 19.9*  NEUTROABS 17.2*  HGB 14.4  HCT 43.8  MCV 81.0  PLT 478   Basic Metabolic Panel: Recent Labs  Lab 09/13/2017 1102  NA 136  K 4.4  CL 97*  CO2 26  GLUCOSE 189*  BUN 62*  CREATININE 1.55*  CALCIUM 9.7   GFR: Estimated Creatinine Clearance: 43.2 mL/min (A) (by C-G formula based on SCr of 1.55 mg/dL (H)). Liver Function Tests: Recent Labs  Lab 09/17/2017 1102  AST 84*  ALT 151*  ALKPHOS 152*  BILITOT 1.0  PROT 6.5  ALBUMIN 2.5*   No results for input(s): LIPASE, AMYLASE in the last 168 hours. No results for input(s): AMMONIA in the last 168 hours. Coagulation Profile: No results for input(s): INR, PROTIME in the last 168 hours. Cardiac Enzymes: Recent Labs  Lab 09/08/2017 1102 09/20/2017 1648  TROPONINI 0.05* 0.05*   BNP (last 3 results) No results for input(s):  PROBNP in the last 8760 hours. HbA1C: No results for input(s): HGBA1C in the last 72 hours. CBG: No results for input(s): GLUCAP in the last 168 hours. Lipid Profile: No results for input(s): CHOL, HDL, LDLCALC, TRIG, CHOLHDL, LDLDIRECT in the last 72 hours. Thyroid Function Tests: No results for input(s): TSH, T4TOTAL, FREET4, T3FREE, THYROIDAB in the last 72 hours. Anemia Panel: No results for input(s): VITAMINB12, FOLATE, FERRITIN, TIBC, IRON, RETICCTPCT in the last 72 hours. Urine analysis:    Component Value Date/Time    COLORURINE YELLOW 09/16/2017 [place] 09/21/2017 1245   LABSPEC 1.018 09/20/2017 1245   PHURINE 5.0 09/17/2017 1245   GLUCOSEU 50 (A) 09/24/2017 1245   HGBUR SMALL (A) 09/09/2017 1245   BILIRUBINUR NEGATIVE 09/03/2017 1245   KETONESUR NEGATIVE 09/26/2017 1245   PROTEINUR NEGATIVE 09/17/2017 1245   UROBILINOGEN 1.0 08/14/2011 0959   NITRITE NEGATIVE 09/14/2017 1245   LEUKOCYTESUR NEGATIVE 09/07/2017 1245     Morrill Bomkamp M.D. [facility] 09/19/2017, 10:33 AM  Pager: [phone] Between 7am to 7pm - call Pager - [phone]  After 7pm go to [email or web address] - password [email or web address]  Call night coverage person covering after 7pm

## 2017-09-19 NOTE — Progress Notes (Signed)
  Echocardiogram 2D Echocardiogram has been performed.  Joe Shah 09/19/2017, 4:55 PM

## 2017-09-19 NOTE — Consult Note (Signed)
Date of Admission:  09/28/2017          Reason for Consult: Enterococcal bacteremia with suspicion for prosthetic valve endocarditis with septic emboli to the brain     Referring Provider: Terrilyn Saver auto consult and Dr. Tana Coast   Assessment: 1. 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 Recent hospitalization for fall in context of "viral ilness"  3. Severe spinal stenosis sp steroids 4. Porcine AVR 5 hx of bladder cancer rx with BCG 6.  Seizure 7.  Atrial fibrillation  Plan: 1. Ampicillin IV and daptomycin in case this is an ampicillin resistant enterococcal species 2. Check 2D echocardiogram and he will need a transesophageal echocardiogram 3. He has a very high likelihood of having endocarditis and if this is an ampicillin sensitive she is would end up treating him with dual therapy with ampicillin and ceftriaxone 4. Repeating blood cultures  Dr. Baxter Flattery will take over the service tomorrow.  Principal Problem:   Ischemic stroke [facility]) Active Problems:   Essential hypertension   Aortic valve Replacement   Paroxysmal atrial fibrillation (HCC)   CAD (coronary artery disease)   Spinal stenosis, lumbar   Elevated troponin   Seizure (HCC)   Bacteremia due to Enterococcus   Scheduled Meds: .  stroke: mapping our early stages of recovery book   Does not apply Once  . aspirin  300 mg Rectal Daily   Or  . aspirin  325 mg Oral Daily  . enoxaparin (LOVENOX) injection  40 mg Subcutaneous Q24H  . simvastatin  20 mg Oral QPM  . tamsulosin  0.4 mg Oral Daily   Continuous Infusions: . sodium chloride 75 mL/hr (09/29/2017 1818)  . ampicillin (OMNIPEN) IV Stopped (09/19/17 1005)  . DAPTOmycin (CUBICIN)  IV 600 mg (09/19/17 1250)  . levETIRAcetam     PRN Meds:.acetaminophen, gabapentin, senna-docusate  HPI: Joe Shah is a 77 y.o. 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  While that was going on he had a fall and was brought to the emergency department where he was imaged an MRI of his spine showed multilevel spinal stenosis.  He was evaluated by Dr. Trenton Gammon with neurosurgery and given corticosteroids and pain control in the hospital.  Patient was discharged to home on Medrol Dosepak, which he completed on 2/13.  This morning, his son was helping him get dressed, with the patient fell back on the bed became extremely rigid.  This lasted for approximately 45-90 seconds, followed by what appeared to be 5 minutes of confusion and disorientation.  Son reports no generalized shaking or rhythmic movements.  MRI has shown multiple embolic CVA's.  Cultures are growing enterococcus in 2 out of 2 cultures.  Sensitivities are pending but there is no vancomycin resistance by B CID.  He claims that he was recently treated for urinary tract infection by his primary care doctor but I cannot find documentation for that and his family also think that this is not something they remember.  Currently he has some right lower extremity weakness which was apparently present after the fall along with some delays in ability to name objects and events.   Review of Systems: Review of Systems  Constitutional: Positive for chills, fever and malaise/fatigue. Negative for diaphoresis and weight loss.  HENT: Positive for congestion. Negative for hearing loss, sore throat and tinnitus.   Eyes: Negative for  blurred vision and double vision.  Respiratory: Negative for cough, sputum production, shortness of breath and wheezing.   Cardiovascular: Negative for chest pain, palpitations and leg swelling.  Gastrointestinal: Negative for abdominal pain, blood in stool, constipation, diarrhea, heartburn, melena, nausea and vomiting.  Genitourinary: Negative for dysuria, flank pain and hematuria.  Musculoskeletal: Negative for back pain, falls, joint pain and  myalgias.  Skin: Negative for itching and rash.  Neurological: Positive for dizziness, focal weakness, seizures, weakness and headaches. Negative for sensory change and loss of consciousness.  Endo/Heme/Allergies: Bruises/bleeds easily.  Psychiatric/Behavioral: Negative for depression, memory loss and suicidal ideas. The patient is not nervous/anxious.     Past Medical History:  Diagnosis Date  . ADD (attention deficit disorder with hyperactivity)   . Aortic stenosis   . Aortic valve disorders   . Arthritis   . Bruises easily   . Cancer [facility])    bladder cancer  . Head injury    at age 3 from [place]  . Heart murmur   . HOH (hard of hearing)   . Hyperlipidemia    takes Zocor daily  . Ischemic stroke ([place]) 09/06/2017  . Microscopic hematuria    managed by medical MD Dr.Cynthia Melina Copa in [place]  . Other and unspecified hyperlipidemia   . Other B-complex deficiencies   . Scoliosis   . Shortness of breath    with exertion   . Unspecified essential hypertension    takes Accupril daily  . Urinary frequency     Social History   Tobacco Use  . Smoking status: Former Smoker    Packs/day: 0.50    Years: 35.00    Pack years: 17.50    Types: Cigarettes    Start date: 12/29/1956    Last attempt to quit: 08/03/2001    Years since quitting: 16.1  . Smokeless tobacco: Former Systems developer    Types: Snuff    Quit date: 08/17/2011  . Tobacco comment: quit smoking 10+yrs ago;dips snuff  Substance Use Topics  . Alcohol use: Yes    Alcohol/week: 0.6 oz    Types: 1 Glasses of wine per week    Comment: one glass per night  . Drug use: No    Family History  Problem Relation Age of Onset  . Heart failure Mother        died 54  . Brain cancer Father        died 8  . Anesthesia problems Neg Hx   . Hypotension Neg Hx   . Malignant hyperthermia Neg Hx   . Pseudochol deficiency Neg Hx    No Known Allergies  OBJECTIVE: Blood pressure 109/72, pulse 99, temperature 98.5 F (36.9 C),  temperature source Oral, resp. rate 18, height 6' (1.829 m), weight 166 lb 0.1 oz (75.3 kg), SpO2 95 %.  Physical Exam  Constitutional: No distress.  HENT:  Head: Normocephalic and atraumatic.  Right Ear: External ear normal.  Left Ear: External ear normal.  Nose: Nose normal.  Mouth/Throat: Oropharynx is clear and moist. No oropharyngeal exudate.  Eyes: Conjunctivae and EOM are normal. Pupils are equal, round, and reactive to light. No scleral icterus.  Neck: Normal range of motion. Neck supple.  Cardiovascular: Normal rate. An irregularly irregular rhythm present. Exam reveals no gallop and no friction rub.  Murmur heard.  Systolic murmur is present with a grade of 2/6. Loudest at RUSB  Pulmonary/Chest: Effort normal and breath sounds normal. No respiratory distress. He has no wheezes. He has no rales.  Abdominal: Soft. Bowel sounds are normal. He exhibits no distension. There is no tenderness. There is no rebound.  Musculoskeletal: Normal range of motion. He exhibits no edema or tenderness.  Lymphadenopathy:    He has no cervical adenopathy.  Neurological: He is alert.  He has reduced strength in his right leg with hip flexion though this is supposedly chronic.  He has difficulty naming some things when he is giving his history and there is delay in his speaking.  Skin: Skin is warm and dry. No rash noted. He is not diaphoretic. No erythema. No pallor.  Multiple ecchymoses but no Janeway's or Osler's nodes  Psychiatric: Mood and affect normal.  Nursing note and vitals reviewed.   Lab Results Lab Results  Component Value Date   WBC 23.3 (H) 09/19/2017   HGB 11.9 (L) 09/19/2017   HCT 36.0 (L) 09/19/2017   MCV 81.4 09/19/2017   PLT 180 09/19/2017    Lab Results  Component Value Date   CREATININE 1.17 09/19/2017   BUN 38 (H) 09/19/2017   NA 136 09/19/2017   K 4.0 09/19/2017   CL 104 09/19/2017   CO2 21 (L) 09/19/2017    Lab Results  Component Value Date   ALT 151 (H)  09/21/2017   AST 84 (H) 09/28/2017   ALKPHOS 152 (H) 09/04/2017   BILITOT 1.0 09/30/2017     Microbiology: Recent Results (from the past 240 hour(s))  Blood culture (routine x 2)     Status: None (Preliminary result)   Collection Time: 09/29/2017 11:40 AM  Result Value Ref Range Status   Specimen Description   Final    BLOOD LEFT ANTECUBITAL BOTTLES DRAWN AEROBIC AND ANAEROBIC Performed at [facility], [address], [place], [place] [address]    Special Requests   Final    Blood Culture adequate volume Performed at [facility], [address], [place], [place] [address]    Culture  Setup Time   Final    GRAM POSITIVE COCCI Gram Stain Report Called to,Read Back By and Verified With: LURZ,G @ 3790 ON 09/19/17 BY JUW GS DONE @ [facility] GRAM STAIN REVIEWED-AGREE WITH RESULT 0623 09/19/17 [facility].[place] CRITICAL RESULT CALLED TO, READ BACK BY AND VERIFIED WITH: G. ABBOTT, RPHARMD AT 0810 ON 09/19/17 BY C. JESSUP, MLT. Performed at [facility], [place] [address], [place], [place] [address]    Culture GRAM POSITIVE COCCI  Final   Report Status PENDING  Incomplete  Blood Culture ID Panel (Reflexed)     Status: Abnormal   Collection Time: 09/15/2017 11:40 AM  Result Value Ref Range Status   Enterococcus species DETECTED (A) NOT DETECTED Final    Comment: CRITICAL RESULT CALLED TO, READ BACK BY AND VERIFIED WITH: G. ABBOTT, RPHARMD AT 0810 ON 09/19/17 BY C. JESSUP, MLT.    Vancomycin resistance NOT DETECTED NOT DETECTED Final   Listeria monocytogenes NOT DETECTED NOT DETECTED Final   Staphylococcus species NOT DETECTED NOT DETECTED Final   Staphylococcus aureus NOT DETECTED NOT DETECTED Final   Streptococcus species NOT DETECTED NOT DETECTED Final   Streptococcus agalactiae NOT DETECTED NOT DETECTED Final   Streptococcus pneumoniae NOT DETECTED NOT DETECTED Final   Streptococcus pyogenes NOT DETECTED NOT DETECTED Final   Acinetobacter baumannii NOT DETECTED NOT DETECTED Final    Enterobacteriaceae species NOT DETECTED NOT DETECTED Final   Enterobacter cloacae complex NOT DETECTED NOT DETECTED Final   Escherichia coli NOT DETECTED NOT DETECTED Final   Klebsiella oxytoca NOT DETECTED NOT DETECTED  Final   Klebsiella pneumoniae NOT DETECTED NOT DETECTED Final   Proteus species NOT DETECTED NOT DETECTED Final   Serratia marcescens NOT DETECTED NOT DETECTED Final   Haemophilus influenzae NOT DETECTED NOT DETECTED Final   Neisseria meningitidis NOT DETECTED NOT DETECTED Final   Pseudomonas aeruginosa NOT DETECTED NOT DETECTED Final   Candida albicans NOT DETECTED NOT DETECTED Final   Candida glabrata NOT DETECTED NOT DETECTED Final   Candida krusei NOT DETECTED NOT DETECTED Final   Candida parapsilosis NOT DETECTED NOT DETECTED Final   Candida tropicalis NOT DETECTED NOT DETECTED Final    Comment: Performed at [facility], [place] [address], [place], [place] [address]  Blood culture (routine x 2)     Status: None (Preliminary result)   Collection Time: 09/28/2017 11:49 AM  Result Value Ref Range Status   Specimen Description   Final    BLOOD LEFT HAND BOTTLES DRAWN AEROBIC AND ANAEROBIC Performed at [facility], [address], [place], [place] [address]    Special Requests   Final    Blood Culture results may not be optimal due to an inadequate volume of blood received in culture bottles Performed at [facility] [address], [place], [place] [address]    Culture  Setup Time   Final    GRAM POSITIVE COCCI Gram Stain Report Called to,Read Back By and Verified With: LURZ,G @ 6962 ON 09/19/17 BY JUW GS DONE @ [facility] GRAM STAIN [place] WITH RESULT 0619 09/19/17 M.[place]BE[place] Performed at [facility], [place] [address] [place], [place] [address]    Culture   Final    NO GROWTH < 24 HOURS Performed at [facility], [address], [place], [place] [address]    Report Status PENDING  Incomplete    [name], [place] for Infectious  [place] [phone] pager  09/19/2017, 1:33 PM

## 2017-09-20 ENCOUNTER — Inpatient Hospital Stay (HOSPITAL_COMMUNITY): Payer: Medicare Other

## 2017-09-20 ENCOUNTER — Encounter (HOSPITAL_COMMUNITY): Payer: Self-pay | Admitting: *Deleted

## 2017-09-20 ENCOUNTER — Encounter (HOSPITAL_COMMUNITY): Admission: EM | Disposition: E | Payer: Self-pay | Source: Home / Self Care | Attending: Internal Medicine

## 2017-09-20 DIAGNOSIS — I6300 Cerebral infarction due to thrombosis of unspecified precerebral artery: Secondary | ICD-10-CM

## 2017-09-20 DIAGNOSIS — I330 Acute and subacute infective endocarditis: Secondary | ICD-10-CM

## 2017-09-20 DIAGNOSIS — I6389 Other cerebral infarction: Secondary | ICD-10-CM

## 2017-09-20 DIAGNOSIS — Z952 Presence of prosthetic heart valve: Secondary | ICD-10-CM

## 2017-09-20 DIAGNOSIS — I639 Cerebral infarction, unspecified: Secondary | ICD-10-CM

## 2017-09-20 DIAGNOSIS — T826XXD Infection and inflammatory reaction due to cardiac valve prosthesis, subsequent encounter: Secondary | ICD-10-CM

## 2017-09-20 DIAGNOSIS — G9340 Encephalopathy, unspecified: Secondary | ICD-10-CM

## 2017-09-20 DIAGNOSIS — R740 Nonspecific elevation of levels of transaminase and lactic acid dehydrogenase [LDH]: Secondary | ICD-10-CM

## 2017-09-20 DIAGNOSIS — I959 Hypotension, unspecified: Secondary | ICD-10-CM

## 2017-09-20 HISTORY — PX: TEE WITHOUT CARDIOVERSION: SHX5443

## 2017-09-20 LAB — LIPID PANEL
CHOL/HDL RATIO: 6 ratio
Cholesterol: 108 mg/dL (ref 0–200)
HDL: 18 mg/dL — AB (ref >40)
LDL CALC: 67 mg/dL (ref 0–99)
Triglycerides: 114 mg/dL (ref <150)
VLDL: 23 mg/dL (ref 0–40)

## 2017-09-20 LAB — CK: Total CK: <5 U/L — ABNORMAL LOW (ref 49–397)

## 2017-09-20 LAB — HEMOGLOBIN A1C
HEMOGLOBIN A1C: 7.4 % — AB (ref 4.8–5.6)
MEAN PLASMA GLUCOSE: 165.68 mg/dL

## 2017-09-20 LAB — HIV ANTIBODY (ROUTINE TESTING W REFLEX): HIV SCREEN 4TH GENERATION: NONREACTIVE

## 2017-09-20 LAB — TROPONIN I: Troponin I: <0.03 ng/mL (ref <0.03)

## 2017-09-20 SURGERY — ECHOCARDIOGRAM, TRANSESOPHAGEAL
Anesthesia: Moderate Sedation

## 2017-09-20 MED ORDER — SODIUM CHLORIDE 0.9 % IV SOLN: INTRAVENOUS | Status: DC

## 2017-09-20 MED ORDER — SIMVASTATIN 20 MG PO TABS
20.0000 mg | ORAL_TABLET | Freq: Every evening | ORAL | Status: DC
  Administered 2017-09-21: 20 mg via ORAL
  Filled 2017-09-20 (×2): qty 1

## 2017-09-20 MED ORDER — MIDAZOLAM HCL 10 MG/2ML IJ SOLN
INTRAMUSCULAR | Status: DC | PRN
  Administered 2017-09-20 (×2): 2 mg via INTRAVENOUS
  Administered 2017-09-20: 3 mg via INTRAVENOUS

## 2017-09-20 MED ORDER — FENTANYL CITRATE (PF) 100 MCG/2ML IJ SOLN
INTRAMUSCULAR | Status: AC
  Filled 2017-09-20: qty 2

## 2017-09-20 MED ORDER — GENTAMICIN SULFATE 40 MG/ML IJ SOLN
110.0000 mg | Freq: Two times a day (BID) | INTRAMUSCULAR | Status: DC
  Administered 2017-09-20 – 2017-09-21 (×3): 110 mg via INTRAVENOUS
  Filled 2017-09-20 (×5): qty 2.75

## 2017-09-20 MED ORDER — FENTANYL CITRATE (PF) 100 MCG/2ML IJ SOLN
INTRAMUSCULAR | Status: DC | PRN
  Administered 2017-09-20: 50 ug via INTRAVENOUS
  Administered 2017-09-20: 25 ug via INTRAVENOUS

## 2017-09-20 MED ORDER — SIMVASTATIN 40 MG PO TABS: 40.0000 mg | ORAL_TABLET | Freq: Every evening | ORAL | Status: DC

## 2017-09-20 MED ORDER — SODIUM CHLORIDE 0.9 % IV SOLN
750.0000 mg | INTRAVENOUS | Status: DC
  Administered 2017-09-20: 750 mg via INTRAVENOUS
  Filled 2017-09-20: qty 15

## 2017-09-20 MED ORDER — MIDAZOLAM HCL 5 MG/ML IJ SOLN
INTRAMUSCULAR | Status: AC
  Filled 2017-09-20: qty 2

## 2017-09-20 MED ORDER — BUTAMBEN-TETRACAINE-BENZOCAINE 2-2-14 % EX AERO
INHALATION_SPRAY | CUTANEOUS | Status: DC | PRN
  Administered 2017-09-20: 2 via TOPICAL

## 2017-09-20 MED ORDER — SODIUM CHLORIDE 0.9 % IV SOLN
2.0000 g | INTRAVENOUS | Status: DC
  Administered 2017-09-20 – 2017-09-22 (×10): 2 g via INTRAVENOUS
  Filled 2017-09-20 (×15): qty 2000

## 2017-09-20 NOTE — CV Procedure (Signed)
Procedure: TEE  Sedation: Versed 7 mg IV, Fentanyl 75 mcg IV  Indication: Endocarditis  Findings: Please see echo section for full report.  Normal LV size with mild focal basal septal hypertrophy.  EF 60-65%.  Normal RV size and systolic function.  Mild left atrial enlargement, no LA appendage thrombus.  Normal right atrium.  No PFO/ASD by color doppler. Trivial TR.  Trivial mitral regurgitation, no significant stenosis.  There was a bioprosthetic aortic valve.  There was extensive vegetation that appeared to involve each leaflet. No significant regurgitation noted.  Mean gradient across the aortic valve measured 56 mmHg.  Normal caliber aorta, grade III plaque descending thoracic aorta.    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   Joe Shah 09/19/2017 2:33 PM

## 2017-09-20 NOTE — Progress Notes (Signed)
    [facility] has been requested to perform a transesophageal echocardiogram on Dr. Mallie Mussel for stroke.  After careful review of history and examination, the risks and benefits of transesophageal echocardiogram have been explained including risks of esophageal damage, perforation (1:10,000 risk), bleeding, pharyngeal hematoma as well as other potential complications associated with conscious sedation including aspiration, arrhythmia, respiratory failure and death. Alternatives to treatment were discussed, questions were answered. Patient is willing to proceed.  TEE - Dr. Aundra Dubin @ 1400. Keep NPO. Meds with sips.   Leanor Kail, PA-C 09/18/2017 12:14 PM

## 2017-09-20 NOTE — Progress Notes (Signed)
Pharmacy Antibiotic Note  Joe Shah is a 77 y.o. male admitted on 09/05/2017 with bacteremia.  Pharmacy has been consulted for gentamicin dosing. Also on ampicillin per ID.  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   Plan: Cont Ampicillin 2g IV q4h for improving renal function Gentamicin 110 mg IV q12h (~3 mg/kg/day per IDSA guidelines) Follow c/s, renal function, gentamicin trough at steady-state, ID plans Consider resuming statin since dapto d/c'd  Height: 6' (182.9 cm) Weight: 166 lb 0.1 oz (75.3 kg) IBW/kg (Calculated) : 77.6  Temp (24hrs), Avg:98.9 F (37.2 C), Min:98 F (36.7 C), Max:99.5 F (37.5 C)  Recent Labs  Lab 09/25/2017 1102 09/16/2017 1116 09/12/2017 1319 09/19/17 1103  WBC 19.9*  --   --  23.3*  CREATININE 1.55*  --   --  1.17  LATICACIDVEN  --  2.14* 1.60  --     Estimated Creatinine Clearance: 57.2 mL/min (by C-G formula based on SCr of 1.17 mg/dL).    No Known Allergies  Antimicrobials this admission: Ampicillin 2/17 >>  Daptomycin 2/17 >> 2/18 Gentamicin 2/18 >>  Dose adjustments this admission: 2/18 - increased ampicillin to 2g IV q4h for CrCl>50; increased Dapto to 10 mg/kg to cover for Enterococcal Endocarditis  Microbiology results: 2/16 BCx: 2/2 Enterococcus faecalis 2/16 UCx:  Enterococcus faecalis  Thank you for allowing pharmacy to be a part of this patient's care.  Renold Genta, PharmD, BCPS Clinical Pharmacist Phone for today - [place] - [phone] 09/20/2017 6:18 PM

## 2017-09-20 NOTE — Progress Notes (Signed)
[facility]  Patient Status: Active (receiving services up to time of hospitalization)  [facility] is providing the following services: PT  If patient discharges after hours, please call [phone].   Joe Shah 09/08/2017, 11:58 AM

## 2017-09-20 NOTE — Plan of Care (Signed)
Discussed POC w/pt and family, denies question/concerns at this time.

## 2017-09-20 NOTE — Progress Notes (Signed)
In recovery, patient became tachypneic and attempted to cough up phlegm, but unable to do so. Patient suctioned and order received for CXR.  Xray performed and Dr. Aundra Dubin at bedside.  Courtlanda, bedside RN, notified.  Patient transferred back to hospital room.

## 2017-09-20 NOTE — Interval H&P Note (Signed)
History and Physical Interval Note:  09/16/2017 1:58 PM  LEXINGTON KROTZ  has presented today for surgery, with the diagnosis of STROKE  The various methods of treatment have been discussed with the patient and family. After consideration of risks, benefits and other options for treatment, the patient has consented to  Procedure(s): TRANSESOPHAGEAL ECHOCARDIOGRAM (TEE) (N/A) as a surgical intervention .  The patient's history has been reviewed, patient examined, no change in status, stable for surgery.  I have reviewed the patient's chart and labs.  Questions were answered to the patient's satisfaction.     Satsuki Zillmer [employer]

## 2017-09-20 NOTE — Care Management Note (Signed)
Case Management Note  Patient Details  Name: Joe Shah MRN: [id number] Date of Birth: 01-05-1941  Subjective/Objective:     Pt admitted with intractable pain and found positive for CVA. He is from home with his spouse.                Action/Plan: Recommendations are for SNF. CM following for discharge disposition.    Expected Discharge Date:  09/21/17               Expected Discharge Plan:  Skilled Nursing Facility  In-House Referral:  Clinical Social Work  Discharge planning Services     Post Acute Care Choice:    Choice offered to:     DME Arranged:    DME Agency:     HH Arranged:    HH Agency:     Status of Service:  In process, will continue to follow  If discussed at Long Length of Stay Meetings, dates discussed:    Additional Comments:  Pollie Friar, RN 09/30/2017, 4:01 PM

## 2017-09-20 NOTE — Progress Notes (Signed)
[facility]  Date of Admission:  09/07/2017     Total days of antibiotics  1  Daptomycin 2/17  Ampicillin 2/17         Patient ID: Joe Shah is a 77 y.o. male with enterococcal bacteremia with suspicion for aortic prosthetic valve endocarditis with septic emboli to brain.   Principal Problem:   Cerebrovascular accident (CVA) [facility]) Active Problems:   Essential hypertension   Aortic valve Replacement   Paroxysmal atrial fibrillation (HCC)   CAD (coronary artery disease)   Spinal stenosis, lumbar   Elevated troponin   New onset seizure ([place])   Bacteremia due to Enterococcus   Syncope   Prosthetic valve endocarditis ([place])   Septic embolism ([place])   .  stroke: mapping our early stages of recovery book   Does not apply Once  . aspirin  300 mg Rectal Daily   Or  . aspirin  325 mg Oral Daily  . enoxaparin (LOVENOX) injection  40 mg Subcutaneous Q24H  . simvastatin  20 mg Oral QPM  . tamsulosin  0.4 mg Oral Daily    INTERVAL HX: Family is present during our conversation. Joe Shah is a little confused and requesting an update as to what is going on. Needs frequent re-orientation during our conversation. No speech difficulty. Does have some LUE weakness and left wrist/FA pain d/t a recently infiltrated PIV site. Thirsty and would like a shower.   Temp max 99.3, WBC up 19.9 - 23.3  No Known Allergies  OBJECTIVE: Vitals:   09/19/17 2154 09/24/2017 0200 09/24/2017 0513 09/19/2017 0754  BP: 123/88 131/86 114/73 104/60  Pulse: (!) 112 (!) 111 (!) 103 (!) 102  Resp: 18 20 20 20   Temp: 98 F (36.7 C) 98.7 F (37.1 C) 99.3 F (37.4 C) 99.1 F (37.3 C)  TempSrc: Oral Axillary Oral Oral  SpO2: 96% 96% 97% 92%  Weight:      Height:       Body mass index is 22.51 kg/m.  Physical Exam  Constitutional: He is well-developed, well-nourished, and in no distress.  HENT:  Mouth/Throat: No oral lesions. Normal dentition. No dental caries.    Oral mucosa dry, coating over tongue.   Eyes: No scleral icterus.  Cardiovascular: Normal rate and regular rhythm.  Murmur: 2/6 systolic over RUSB. Pulmonary/Chest: Effort normal and breath sounds normal.  Abdominal: Soft. He exhibits no distension. There is no tenderness.  Lymphadenopathy:    He has no cervical adenopathy.  Neurological: He is alert. He is disoriented. He displays weakness. He displays facial symmetry. No cranial nerve deficit.  Some LUE weakness - required right arm assistance initially to raise left up however he was able to hold LUE in air without waver. Adequate distal sensation.    Skin: Skin is warm and dry. No rash noted.  Psychiatric: Mood and affect normal.  Vitals reviewed.  Lab Results Lab Results  Component Value Date   WBC 23.3 (H) 09/19/2017   HGB 11.9 (L) 09/19/2017   HCT 36.0 (L) 09/19/2017   MCV 81.4 09/19/2017   PLT 180 09/19/2017    Lab Results  Component Value Date   CREATININE 1.17 09/19/2017   BUN 38 (H) 09/19/2017   NA 136 09/19/2017   K 4.0 09/19/2017   CL 104 09/19/2017   CO2 21 (L) 09/19/2017    Lab Results  Component Value Date   ALT 151 (H) 09/21/2017   AST 84 (  H) 09/11/2017   ALKPHOS 152 (H) 09/06/2017   BILITOT 1.0 09/11/2017     Microbiology: BCx 2/16 >> Enterococcus on BCID 1/2 bottles with GPCs   ASSESSMENT: 77 y.o. male admitted with acute stroke symptoms and found to have enterococcal bacteremia in setting of known prosthetic aortic valve. Discussed how symptom presentation (stroke sx, bacteremia and known PV) would raise our concern for endocarditis which seemed to make good sense to the patient's family. Likely will need rehab vs SNF stay after hospitalization. Discussed PICC line and need for prolonged treatment. They have many questions about where this bacteria came from - he has had a lot of back pain he has been treated for recently but no history of significant skin wounds, abdominal pain or urinary tract  infections. He did have some heavy aortic calcifications seen on 2017 echo (tissue valve replacement 2013 by Dr Joe Shah). He does have a murmur on exam today.   PLAN: 1. Will change abtx to  Ampicillin + Joe Shah for PVE due to e.faecalis 2. If patient has slight increase in Cr, will have low threshold to change to ampicillin plus ceftriaxone in dual-beta lactam treatment  3. Repeat blood cultures today.    Joe Madeira, MSN, NP-C [facility] Cell: [phone] Pager: [phone]  09/05/2017  9:35 AM  ------------- See attending note

## 2017-09-20 NOTE — Progress Notes (Signed)
Pharmacy Antibiotic Note  Joe Shah is a 77 y.o. male admitted on 09/05/2017 with bacteremia.  Pharmacy has been consulted for daptomycin dosing. Also on ampicillin per ID.  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   Still awaiting sensitivities for the Enterococcus - given confirmed endocarditis, will increase Daptomcyin dosing to 10 mg/kg starting on 2/19 as today's dose has already been made. Noted plans to transition Dapto >> Rocephin for syngergy if isolate is amp-sensitive. . Ampicillin dosing remains appropriate for now.   Plan: Cont Ampicillin 2g IV q4h for improving renal function Adjust Daptomycin to 750 mg (10 mg/kg) every 24 hours to start on 2/19 Follow c/s, renal function, CK qMonday, TEE, ID plans Statin on hold  Height: 6' (182.9 cm) Weight: 166 lb 0.1 oz (75.3 kg) IBW/kg (Calculated) : 77.6  Temp (24hrs), Avg:98.8 F (37.1 C), Min:98 F (36.7 C), Max:99.5 F (37.5 C)  Recent Labs  Lab 09/26/2017 1102 09/08/2017 1116 09/11/2017 1319 09/19/17 1103  WBC 19.9*  --   --  23.3*  CREATININE 1.55*  --   --  1.17  LATICACIDVEN  --  2.14* 1.60  --     Estimated Creatinine Clearance: 57.2 mL/min (by C-G formula based on SCr of 1.17 mg/dL).    No Known Allergies  Antimicrobials this admission: Ampicillin 2/17 >>  Daptomycin 2/17 >>   Dose adjustments this admission: 2/18 - increased ampicillin to 2g IV q4h for CrCl>50; increased Dapto to 10 mg/kg to cover for Enterococcal Endocarditis  Microbiology results: 2/16 BCx: 2/2 Enterococcus faecalis 2/16 UCx:  Enterococcus faecalis  Thank you for allowing pharmacy to be a part of this patient's care.  Alycia Rossetti, PharmD, BCPS Clinical Pharmacist Pager: [phone] Clinical phone for 09/16/2017 from 7a-3:30p: [phone] If after 3:30p, please call main pharmacy at: [phone] 09/20/2017 3:08 PM

## 2017-09-20 NOTE — Progress Notes (Signed)
NEUROHOSPITALISTS STROKE TEAM - DAILY PROGRESS NOTE   ADMISSION HISTORY: Joe Shah is an 77 y.o. 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  The patient is a poor historian. According to chart the patient was getting dressed in the morning.His son was helping him when he suddenly fell back on the bed became extremely rigid. Last about 45 seconds followed by 5 minutes of confusion disorientation. Patient taken to ER. 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   Date last known well: 2.16-19 Time last known well: morning tPA Given: no, outside windiw NIHSS: 5 Baseline MRS 1  SUBJECTIVE (INTERVAL HISTORY)  Family is at the bedside. Patient is found laying in bed in NAD. Overall he feels his condition is gradually improving. Voices no new complaints. No new events reported overnight.   OBJECTIVE Lab Results: CBC:  Recent Labs  Lab 09/11/2017 1102 09/19/17 1103  WBC 19.9* 23.3*  HGB 14.4 11.9*  HCT 43.8 36.0*  MCV 81.0 81.4  PLT 160 180   BMP: Recent Labs  Lab 09/24/2017 1102 09/19/17 1103  NA 136 136  K 4.4 4.0  CL 97* 104  CO2 26 21*  GLUCOSE 189* 185*  BUN 62* 38*  CREATININE 1.55* 1.17  CALCIUM 9.7 8.6*   Liver Function Tests:  Recent Labs  Lab 09/11/2017 1102  AST 84*  ALT 151*  ALKPHOS 152*  BILITOT 1.0  PROT 6.5  ALBUMIN 2.5*   Cardiac Enzymes:  Recent Labs  Lab 09/03/2017 1102 09/15/2017 1648 09/27/2017 0417  CKTOTAL  --   --  <5*  TROPONINI 0.05* 0.05*  --    Microbiology:  Recent Results (from the past 240 hour(s))  Blood culture (routine x 2)     Status:  Abnormal (Preliminary result)   Collection Time: 09/08/2017 11:40 AM  Result Value Ref Range Status   Specimen Description   Final    BLOOD LEFT ANTECUBITAL BOTTLES DRAWN AEROBIC AND ANAEROBIC Performed at [facility], [address], [place], [place] [address]    Special Requests   Final    Blood Culture adequate volume Performed at [facility], [address] [place], [place] [address]    Culture  Setup Time   Final    GRAM POSITIVE COCCI Gram Stain Report Called to,Read Back By and Verified With: LURZ,G @ 4401 ON 09/19/17 BY JUW GS DONE @ [facility] GRAM STAIN REVIEWED-AGREE WITH RESULT 0623 09/19/17 M.CAMPBELL CRITICAL RESULT CALLED TO, READ BACK BY AND VERIFIED WITH: G. ABBOTT, RPHARMD AT 0810 ON 09/19/17 BY C. JESSUP, MLT.    Culture (A)  Final    ENTEROCOCCUS FAECALIS SUSCEPTIBILITIES TO FOLLOW Performed at [facility], [place] [address], [place], [place] [address]    Report Status PENDING  Incomplete  Blood Culture ID Panel (Reflexed)     Status: Abnormal   Collection Time: 09/14/2017 11:40 AM  Result Value Ref Range Status   Enterococcus species DETECTED (A) NOT DETECTED Final    Comment: CRITICAL RESULT CALLED TO, READ BACK BY AND VERIFIED WITH: G. ABBOTT, RPHARMD AT 0810 ON 09/19/17 BY C. JESSUP, MLT.    Vancomycin resistance NOT DETECTED NOT DETECTED Final   Listeria monocytogenes NOT DETECTED NOT DETECTED Final   Staphylococcus  species NOT DETECTED NOT DETECTED Final   Staphylococcus aureus NOT DETECTED NOT DETECTED Final   Streptococcus species NOT DETECTED NOT DETECTED Final   Streptococcus agalactiae NOT DETECTED NOT DETECTED Final   Streptococcus pneumoniae NOT DETECTED NOT DETECTED Final   Streptococcus pyogenes NOT DETECTED NOT DETECTED Final   Acinetobacter baumannii NOT DETECTED NOT DETECTED Final   Enterobacteriaceae species NOT DETECTED NOT DETECTED Final   Enterobacter cloacae complex NOT DETECTED NOT DETECTED Final   Escherichia coli NOT DETECTED NOT DETECTED  Final   Klebsiella oxytoca NOT DETECTED NOT DETECTED Final   Klebsiella pneumoniae NOT DETECTED NOT DETECTED Final   Proteus species NOT DETECTED NOT DETECTED Final   Serratia marcescens NOT DETECTED NOT DETECTED Final   Haemophilus influenzae NOT DETECTED NOT DETECTED Final   Neisseria meningitidis NOT DETECTED NOT DETECTED Final   Pseudomonas aeruginosa NOT DETECTED NOT DETECTED Final   Candida albicans NOT DETECTED NOT DETECTED Final   Candida glabrata NOT DETECTED NOT DETECTED Final   Candida krusei NOT DETECTED NOT DETECTED Final   Candida parapsilosis NOT DETECTED NOT DETECTED Final   Candida tropicalis NOT DETECTED NOT DETECTED Final    Comment: Performed at [facility], [place] [address] [place], [place] [address]  Blood culture (routine x 2)     Status: Abnormal (Preliminary result)   Collection Time: 09/10/2017 11:49 AM  Result Value Ref Range Status   Specimen Description   Final    BLOOD LEFT HAND BOTTLES DRAWN AEROBIC AND ANAEROBIC Performed at [facility], [address] [place], [place] [address]    Special Requests   Final    Blood Culture results may not be optimal due to an inadequate volume of blood received in culture bottles Performed at [facility], [address] [place], [place] [address]    Culture  Setup Time   Final    GRAM POSITIVE COCCI Gram Stain Report Called to,Read Back By and Verified With: LURZ,G @ 9794 ON 09/19/17 BY JUW GS DONE @ [facility] GRAM STAIN [place] WITH RESULT 0619 09/19/17 M.CAMPBELL IN BOTH AEROBIC AND ANAEROBIC BOTTLES Performed at [facility], [place] [address], [place], [place] [address]    Culture ENTEROCOCCUS FAECALIS (A)  Final   Report Status PENDING  Incomplete  Urine Culture     Status: Abnormal (Preliminary result)   Collection Time: 09/23/2017 12:45 PM  Result Value Ref Range Status   Specimen Description   Final    URINE, CATHETERIZED Performed at [facility], [address] [place], [place] [address]     Special Requests   Final    NONE Performed at [facility], [address], [place], [place] [address]    Culture 70,000 COLONIES/mL ENTEROCOCCUS FAECALIS (A)  Final   Report Status PENDING  Incomplete   Urinalysis:  Recent Labs  Lab 09/03/2017 1245  COLORURINE YELLOW  APPEARANCEUR CLEAR  LABSPEC 1.018  PHURINE 5.0  GLUCOSEU 50*  HGBUR SMALL*  BILIRUBINUR NEGATIVE  KETONESUR NEGATIVE  PROTEINUR NEGATIVE  NITRITE NEGATIVE  LEUKOCYTESUR NEGATIVE    PHYSICAL EXAM Temp:  [98 F (36.7 C)-99.5 F (37.5 C)] 98.7 F (37.1 C) (02/18 1603) Pulse Rate:  [88-112] 98 (02/18 1603) Resp:  [18-33] 26 (02/18 1603) BP: (104-147)/(47-116) 125/67 (02/18 1603) SpO2:  [90 %-99 %] 90 % (02/18 1603) General - Well nourished, well developed, in no apparent distress HEENT-  Normocephalic,  Cardiovascular - Regular rate and rhythm  Respiratory - Lungs clear bilaterally. No wheezing. Abdomen - soft  and non-tender, BS normal Extremities- no edema or cyanosis Mental Status: Alert,confused.Oriented to himself and place  Speech fluent without evidence of aphasia. Able to follow simple commands without difficulty. Diminished attention, registration and recall. No aphasia or apraxia or dysarthria Cranial Nerves: II: Visual fields grossly normal,  III,IV, VI: ptosis not present, extra-ocular motions intact bilaterally, pupils equal, round, reactive to light and accommodation V,VII: smile symmetric, facial light touch sensation normal bilaterally VIII: hearing normal bilaterally IX,X: uvula rises symmetrically XI: bilateral shoulder shrug XII: midline tongue extension Motor: Right : Upper extremity   4/5    Left:     Upper extremity   5/5  Lower extremity   5/5     Lower extremity   5/5 Tone and bulk:normal tone throughout; no atrophy noted Sensory: Pinprick and light touch intact throughout, bilaterally Deep Tendon Reflexes: did not assess  Plantars: Right: downgoing   Left:  downgoing Cerebellar: normal finger-to-nose, normal rapid alternating movements and normal heel-to-shin test Gait: normal gait and station   IMAGING: I have personally reviewed the radiological images below and agree with the radiology interpretations. Ct Angio Neck W Or Wo Contrast Result Date: 09/05/2017 IMPRESSION: 1. Patent cervical carotid and vertebral arteries without stenosis or dissection. Nonstenotic plaque in the left ICA. 2.  Aortic Atherosclerosis (ICD10-I70.0). 3. Prior right thyroidectomy. 4.5 cm left thyroid mass. Recommend correlation with presumed prior outside thyroid imaging. Electronically Signed   By: Logan Bores M.D.   On: 09/25/2017 18:18   MRI HEAD WITHOUT AND WITH CONTRAST MRA HEAD WITHOUT CONTRAST 09/19/2017 15:26 IMPRESSION: 1. 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 2. Subcentimeter focus of chronic hemorrhage in the right parieto-occipital white matter compatible with a cavernoma. 3. Mild chronic small vessel ischemic disease and mild-to-moderate cerebral atrophy. 4. Patent circle of Willis without proximal branch occlusion or flow limiting proximal stenosis.  Echocardiogram:                                        Study Conclusions  - Procedure narrative: Transthoracic echocardiography. Image   quality was adequate. The study was technically difficult, as a   result of poor patient compliance. - Left ventricle: The cavity size was normal. Wall thickness was   normal. Systolic function was vigorous. The estimated ejection   fraction was in the range of 65% to 70%. Wall motion was normal;   there were no regional wall motion abnormalities. The study is   not technically sufficient to allow evaluation of LV diastolic   function. - Aortic valve: Poorly visualized. Transvalvular velocity was   minimally increased. There was no regurgitation. - Mitral valve: Calcified  annulus. Borderline stenosis. Trivial MR. - Left atrium: The atrium was normal in size. Impressions:- Very technically difficult study. Cannot evaluate for   endocarditis. LVEF 65-70%, normal wall thickness, normal wall   motion, grade 1 DD, indeterminate LV filling pressure, aortic   valve calcification with minimally increased valve gradient, MAC   with borderline mitral stenosis and trivial MR, norma LA size.  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   EEG:  Impression: This predominantly drowsy and asleep EEG is abnormal due to mild diffuse background slowing.      IMPRESSION: Joe Shah is a 76 y.o. 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  MRI reveals:  Scattered small acute infarcts throughout both cerebral and cerebellar hemispheres suspicious for watershed ischemia.  Suspected Etiology: likely cardioembolic source, +enterococcus bacteremia with vegetation on Prosthetic cardiac valve Resultant Symptoms: confusion and right sided weakness Stroke Risk Factors: diabetes mellitus, hyperlipidemia and hypertension Other Stroke Risk Factors: Advanced age, Aortic stenosis/valve, Hx AFIB, Hx Bladder Cancer, Hx stroke  Outstanding Stroke Work-up Studies:    Work up  Completed at this time  PLAN  09/12/2017: Continue Aspirin/ Statin for now Frequent neuro checks Telemetry monitoring PT/OT/SLP Consult PM & Rehab Consult Case Management /MSW Ongoing aggressive stroke risk factor management Patient counseled to be compliant with his  antithrombotic medications Patient counseled on Lifestyle modifications including, Diet, Exercise, and Stress Follow up with [facility] in 6 weeks  HX OF STROKES: Subcentimeter focus of chronic hemorrhage in the right parieto-occipital white matter   Hx of Paroxysmal AFIB: Will need AC therapy prior to discharge   SEIZURES: EEG- No seizure activity, + slowing. Event likely syncopal. No seizure activity reported overnight Continue Keppra for now Recommend repeat EEG in a few months Maintain Seizure precautions Follow up with outpatient Neurology  HYPERTENSION: Stable, Avoid Hypotension and Dehydration Permissive hypertension (OK if <220/120) for 24-48 hours post stroke and then gradually normalized within 5-7 days. SLong term BP goal normotensive. May slowly restart home B/P medications after 48 hours Home Meds: Cozaar  HYPERLIPIDEMIA:    Component Value Date/Time   CHOL 108 09/17/2017 0417   TRIG 114 09/03/2017 0417   TRIG 113 06/21/2013 1038   HDL 18 (L) 09/15/2017 0417   HDL 56 06/21/2013 1038   CHOLHDL 6.0 09/30/2017 0417   VLDL 23 09/12/2017 0417   LDLCALC 67 09/29/2017 0417   LDLCALC 116 (H) 06/21/2013 1038  Home Meds:  Zocor 20 mg LDL  goal < 70 Continued on  Zocor 20 mg daily Continue statin at discharge  DIABETES: Lab Results  Component Value Date   HGBA1C 7.4 (H) 09/23/2017   No results for input(s): GLUCAP in the last 168 hours. HgbA1c goal < 7.0 Currently on: Novolog Continue CBG monitoring and SSI to maintain glucose 140-180 mg/dl DM education   Other Active Problems: Principal Problem:   Cerebrovascular accident (CVA) ([place]) Active Problems:   Essential hypertension   Aortic valve Replacement   Paroxysmal atrial fibrillation ([facility])   CAD (coronary artery disease)   Spinal stenosis, lumbar   Elevated troponin   New onset seizure ([place])   Bacteremia due to Enterococcus   Syncope   Prosthetic valve endocarditis ([place])   Septic  embolism [facility])    Hospital day # 2 VTE prophylaxis: Lovenox  Diet : Fall precautions Seizure precautions Seizure precautions   FAMILY UPDATES: family at bedside  TEAM UPDATES: Rai, Vernelle Emerald, MD   Prior Home Stroke Medications:  aspirin 81 mg daily  Discharge Stroke Meds:  Please discharge patient on TBD  Disposition: 01-Home or Self Care Therapy Recs:               PENDING Follow Up:  Follow-up Information    Garvin Fila, MD. Schedule an appointment as soon as possible for a visit in 6 week(s).   Specialties:  Neurology, Radiology Contact information: [address] [place] [address] [phone]          Octavio Graves, DO -PCP Follow up in 1-2 weeks      Assessment & plan discussed with with attending physician and they are in agreement.    Mary Sella, ANP-C Stroke Neurology Team 09/17/2017 4:12 PM I have personally examined this patient, reviewed notes, independently viewed imaging studies, participated in medical decision making and plan of care.ROS completed by me personally and pertinent positives fully documented  I have made any additions or clarifications directly to the above note. Agree with note above. He has presented with daughter mental status and episode of the fourth loss of consciousness confusion possible seizure. MRI shows multiple embolic infarcts with positive blood cultures strong suspicion for bacterial endocarditis given his prosthetic heart valve. Check transesophageal echocardiogram to look for vegetations. Long discussion with the patient and family at the bedside and answered questions. Discussed with Dr. Joya Gaskins. Continue antibiotics for presumed endocarditis. Greater than 50% time during this 35 minute visit was spent on counseling and coordination of care for his embolic strokes, endocarditis and answering questions  Antony Contras, MD Medical Director [place] Pager: [phone] 09/13/2017 5:51  PM  To contact Stroke Continuity provider, please refer to [email or web address] After hours, contact General Neurology

## 2017-09-20 NOTE — Progress Notes (Addendum)
[facility]                                                                              Patient Demographics  Joe Shah, is a 77 y.o. male, DOB - 1941/05/10, RCV:[id number]  Admit date - 09/09/2017   Admitting Physician Truett Mainland, DO  Outpatient Primary MD for the patient is Octavio Graves, DO  Outpatient specialists:   LOS - 2  days   Medical records reviewed and are as summarized below:    Chief Complaint  Patient presents with  . Loss of Consciousness       Brief summary   Admit note by Dr. Nehemiah Settle on 2/16:   Joe Shah is a 77 y.o. male with a history of htn, hlp, AS s/p porcine valve replacement in 2013 by Dr. 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  Patient was discharged to home on Medrol Dosepak, which he completed on 2/13 and supposed to follow with Dr. Annette Stable neurosurgery in 2 weeks.   On the morning of admission 09/28/2017, his son was helping him get dressed, with the patient fell back on the bed became extremely rigid, lasted for ~ 45-90 seconds, followed by 5 minutes of confusion and disorientation.  Son reported no generalized shaking or rhythmic movements.  There is no prior history of seizures, head injury, stroke. There was a concern whether the patient has been having chills over the past couple of days.  Assessment & Plan    Principal Problem: Acute embolic stroke - Presented with confusion, right arm weakness following the syncopal versus seizure episode - MRI brain showed multiple bilateral small infarcts in the cerebellar and cerebellum region, predominantly in the watershed territory, could be an cardio embolic given paroxysmal atrial fibrillation,  enterococcus bacteremia - MRA showed patent circle of Willis without proximal brought occlusion of flow limiting  proximal stenosis - Neurology consulted, recommended aspirin 325 mg daily and consider starting anticoagulation, will follow stroke team recommendations -Continue Lipitor 80 mg daily, LDL 67 -Hemoglobin A1c 7.4 -2D echo showed EF of 10-25%, grade 1 diastolic dysfunction.  -TEE planned today due to enterococcus bacteremia    Active Problems:   Bacteremia due to enterococcus - Blood cultures positive for enterococcus, sensitivities pending - Unclear source, patient has porcine aortic valve, replacement in 2013 for  - Recent admission for acute back pain, lumbar stenosis 2/2-2/6, reviewed discharge summary by Dr. Annette Stable, does not appear to have any interventions done, were discharged on pain medications and Medrol Dosepak - UA negative for UTI, chest x-ray negative for pneumonia, no complaints of abdominal symptoms or pain -TEE today -Started on ampicillin and daptomycin per ID Addendum 2:53 PM  Received call from Dr. Aundra Dubin, patient has extensive vegetation involving each leaflet of bioprosthetic aortic valve with severely elevated mean gradient 56 mmHg.  Opening of the valve impaired by the vegetation.  Recommended TCTS consult -TCTS consult called and d/w Ryan, previous aortic valve repair by Dr. Servando Snare    Mildly elevated troponin -  No complaints of chest pain, troponins 2 flat 0.05, likely due to acute stroke and enterococcus bacteremia -2D echo showed EF of 65-70%, no regional wall motion abnormalities  Seizure - Per neurology, likely syncope however could have a seizure in the setting of embolic infarcts -Neurology recommended no AEDs at this time, EEG showed mild diffuse background slowing, no epileptiform discharges.     Essential hypertension -BP currently soft, stable hold antihypertensives     Aortic valve Replacement with porcine valve -TEE today to rule out endocarditis    Paroxysmal atrial fibrillation (HCC) - Currently rate controlled, patient was not on any  anticoagulation due to paroxysmal atrial fibrillation after the aortic valve surgery, no further recurrence -Follow neurology recommendations regarding anticoagulation    CAD (coronary artery disease) - Currently no chest pain, continue aspirin, statin, will follow 2-D echo    Spinal stenosis, lumbar status post recent hospitalization 2/2-2/6 - Denies any significant worsening pain, completed Medrol Dosepak   Code Status: DO NOT RESUSCITATE DVT Prophylaxis:  Lovenox  Family Communication: Discussed in detail with the patient, all imaging results, lab results explained to the patient, daughters and son at the bedside   Disposition Plan:  Time Spent in minutes 35 minutes  Procedures:  MRI brain   Consultants:   Neurology   infectious disease Cardiology for TEE  Antimicrobials:   Ampicillin 2/17   Medications  Scheduled Meds: . [MAR Hold]  stroke: mapping our early stages of recovery book   Does not apply Once  . [MAR Hold] aspirin  300 mg Rectal Daily   Or  . [MAR Hold] aspirin  325 mg Oral Daily  . [MAR Hold] enoxaparin (LOVENOX) injection  40 mg Subcutaneous Q24H  . [MAR Hold] simvastatin  20 mg Oral QPM  . [MAR Hold] tamsulosin  0.4 mg Oral Daily   Continuous Infusions: . sodium chloride 75 mL/hr (09/11/2017 1818)  . sodium chloride    . [MAR Hold] ampicillin (OMNIPEN) IV    . [MAR Hold] DAPTOmycin (CUBICIN)  IV Stopped (09/19/17 1518)  . [MAR Hold] levETIRAcetam Stopped (09/19/2017 0602)   PRN Meds:.[MAR Hold] acetaminophen, butamben-tetracaine-benzocaine, fentaNYL, [MAR Hold] gabapentin, midazolam, [MAR Hold] senna-docusate   Antibiotics   Anti-infectives (From admission, onward)   Start     Dose/Rate Route Frequency Ordered Stop   09/06/2017 1200  [MAR Hold]  ampicillin (OMNIPEN) 2 g in sodium chloride 0.9 % 100 mL IVPB     (MAR Hold since 09/28/2017 1309)   2 g 300 mL/hr over 20 Minutes Intravenous Every 4 hours 09/27/2017 1014     09/19/17 1200  [MAR Hold]   DAPTOmycin (CUBICIN) 600 mg in sodium chloride 0.9 % IVPB     (MAR Hold since 09/25/2017 1309)   600 mg 224 mL/hr over 30 Minutes Intravenous Every 24 hours 09/19/17 1116     09/19/17 0900  ampicillin (OMNIPEN) 2 g in sodium chloride 0.9 % 100 mL IVPB  Status:  Discontinued     2 g 300 mL/hr over 20 Minutes Intravenous Every 6 hours 09/19/17 0819 09/05/2017 1014        Subjective:   Joe Shah was seen and examined today.  Physically appears deconditioned and weaker however per daughter at the bedside cognitively improving.  No seizures or acute issues overnight.  Temp 99.1 this morning. Patient denies dizziness, chest pain, shortness of breath, abdominal pain, N/V/D/C.  Objective:   Vitals:   09/27/2017 0754 09/23/2017 1232 09/23/2017 1316 09/21/2017 1355  BP: 104/60 120/73 [place] Kitchen)  110/47 (!) 132/116  Pulse: (!) 102 93 93 89  Resp: 20 19 20  (!) 27  Temp: 99.1 F (37.3 C)  98.7 F (37.1 C)   TempSrc: Oral  Oral   SpO2: 92% 92% 94% 99%  Weight:      Height:        Intake/Output Summary (Last 24 hours) at 09/06/2017 1401 Last data filed at 09/10/2017 0504 Gross per 24 hour  Intake 2542 ml  Output 1050 ml  Net 1492 ml     Wt Readings from Last 3 Encounters:  09/04/2017 75.3 kg (166 lb 0.1 oz)  09/04/17 81.6 kg (180 lb)  09/02/17 81.6 kg (180 lb)     Exam   General: Alert and oriented x 3, NAD  Eyes:   HEENT:   Cardiovascular: S1 S2, regular No pedal edema b/l  Respiratory: Clear to auscultation bilaterally, no wheezing, rales or rhonchi  Gastrointestinal: Soft, nontender, nondistended, + bowel sounds  Ext: no pedal edema bilaterally  Neuro: no new deficits  Musculoskeletal: No digital cyanosis, clubbing  Skin: No rashes  Psych: Normal affect and demeanor, alert and oriented x3    Data Reviewed:  I have personally reviewed following labs and imaging studies  Micro Results Recent Results (from the past 240 hour(s))  Blood culture (routine x 2)     Status: None  (Preliminary result)   Collection Time: 09/27/2017 11:40 AM  Result Value Ref Range Status   Specimen Description   Final    BLOOD LEFT ANTECUBITAL BOTTLES DRAWN AEROBIC AND ANAEROBIC Performed at [facility], [address] [place], [place] [address]    Special Requests   Final    Blood Culture adequate volume Performed at [facility], [address], [place], [place] [address]    Culture  Setup Time   Final    GRAM POSITIVE COCCI Gram Stain Report Called to,Read Back By and Verified With: LURZ,G @ 0938 ON 09/19/17 BY JUW GS DONE @ [facility] GRAM STAIN REVIEWED-AGREE WITH RESULT 0623 09/19/17 M.[facility] CRITICAL RESULT CALLED TO, READ BACK BY AND VERIFIED WITH: G. ABBOTT, RPHARMD AT 0810 ON 09/19/17 BY C. JESSUP, MLT.    Culture   Final    GRAM POSITIVE COCCI IDENTIFICATION AND SUSCEPTIBILITIES TO FOLLOW Performed at [facility], [place] [address], [place], [place] [address]    Report Status PENDING  Incomplete  Blood Culture ID Panel (Reflexed)     Status: Abnormal   Collection Time: 09/13/2017 11:40 AM  Result Value Ref Range Status   Enterococcus species DETECTED (A) NOT DETECTED Final    Comment: CRITICAL RESULT CALLED TO, READ BACK BY AND VERIFIED WITH: G. ABBOTT, RPHARMD AT 0810 ON 09/19/17 BY C. JESSUP, MLT.    Vancomycin resistance NOT DETECTED NOT DETECTED Final   Listeria monocytogenes NOT DETECTED NOT DETECTED Final   Staphylococcus species NOT DETECTED NOT DETECTED Final   Staphylococcus aureus NOT DETECTED NOT DETECTED Final   Streptococcus species NOT DETECTED NOT DETECTED Final   Streptococcus agalactiae NOT DETECTED NOT DETECTED Final   Streptococcus pneumoniae NOT DETECTED NOT DETECTED Final   Streptococcus pyogenes NOT DETECTED NOT DETECTED Final   Acinetobacter baumannii NOT DETECTED NOT DETECTED Final   Enterobacteriaceae species NOT DETECTED NOT DETECTED Final   Enterobacter cloacae complex NOT DETECTED NOT DETECTED Final   Escherichia coli NOT DETECTED NOT  DETECTED Final   Klebsiella oxytoca NOT DETECTED NOT DETECTED Final   Klebsiella pneumoniae NOT DETECTED NOT DETECTED Final   Proteus species  NOT DETECTED NOT DETECTED Final   Serratia marcescens NOT DETECTED NOT DETECTED Final   Haemophilus influenzae NOT DETECTED NOT DETECTED Final   Neisseria meningitidis NOT DETECTED NOT DETECTED Final   Pseudomonas aeruginosa NOT DETECTED NOT DETECTED Final   Candida albicans NOT DETECTED NOT DETECTED Final   Candida glabrata NOT DETECTED NOT DETECTED Final   Candida krusei NOT DETECTED NOT DETECTED Final   Candida parapsilosis NOT DETECTED NOT DETECTED Final   Candida tropicalis NOT DETECTED NOT DETECTED Final    Comment: Performed at [facility], [place] [address], [place], [place] [address]  Blood culture (routine x 2)     Status: None (Preliminary result)   Collection Time: 09/14/2017 11:49 AM  Result Value Ref Range Status   Specimen Description   Final    BLOOD LEFT HAND BOTTLES DRAWN AEROBIC AND ANAEROBIC Performed at [facility], [address], [place], [place] [address]    Special Requests   Final    Blood Culture results may not be optimal due to an inadequate volume of blood received in culture bottles Performed at [facility], [address] [place], [place] [address]    Culture  Setup Time   Final    GRAM POSITIVE COCCI Gram Stain Report Called to,Read Back By and Verified With: LURZ,G @ 0626 ON 09/19/17 BY JUW GS DONE @ [facility] GRAM STAIN [place] WITH RESULT 0619 09/19/17 M.CAMPBE[facility] IN BOTH AEROBIC AND ANAEROBIC BOTTLES    Culture   Final    GRAM POSITIVE COCCI IDENTIFICATION AND SUSCEPTIBILITIES TO FOLLOW Performed at [facility], [place] [address], [place], [place] [address]    Report Status PENDING  Incomplete  Urine Culture     Status: None (Preliminary result)   Collection Time: 09/16/2017 12:45 PM  Result Value Ref Range Status   Specimen Description   Final    URINE, CATHETERIZED Performed at [facility], [address] [place], [place] [address]    Special Requests   Final    NONE Performed at [facility], [address] [place], [place] [address]    Culture   Final    CULTURE REINCUBATED FOR BETTER GROWTH Performed at [facility], [place] [address] [place], [place] [address]    Report Status PENDING  Incomplete    Radiology Reports Dg Chest 2 View  Result Date: 09/04/2017 CLINICAL DATA:  Cough, fatigue EXAM: CHEST  2 VIEW COMPARISON:  09/10/2011 FINDINGS: There are low lung volumes. There is no focal parenchymal opacity. There is no pleural effusion or pneumothorax. The heart and mediastinal contours are unremarkable. 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 IMPRESSION: No active cardiopulmonary disease. Electronically Signed   By: Kathreen Devoid   On: 09/04/2017 17:03   Dg Lumbar Spine Complete  Result Date: 09/02/2017 CLINICAL DATA:  Fall this morning with bilateral leg weakness and low back pain. EXAM: LUMBAR SPINE - COMPLETE 4+ VIEW COMPARISON:  KUB 01/28/2016 and lumbar spine 11/29/2009 FINDINGS: Moderate curvature of the lower thoracic/lumbar spine convex right without significant change. Moderate spondylosis throughout the lumbar spine. Moderate disc disease at all levels of the lumbar spine without significant change. Grade 2 anterolisthesis of L3 on L4 with interval worsening. This is likely due to the severe facet arthropathy over the mid to lower lumbar spine. No definite compression fracture. Moderate calcified plaque over the abdominal aorta. IMPRESSION: Moderate spondylosis of the lumbar spine with moderate multilevel disc disease  throughout the lumbar spine. Interval progression of grade 2 anterolisthesis of L3 on L4 likely due to severe facet arthropathy. Electronically Signed   By: Marin Olp M.D.   On: 09/02/2017 21:06   Ct Head Wo Contrast  Result Date: 09/21/2017 CLINICAL DATA:  Altered level of  consciousness. Fall 2 weeks ago. Near syncope today. EXAM: CT HEAD WITHOUT CONTRAST TECHNIQUE: Contiguous axial images were obtained from the base of the skull through the vertex without intravenous contrast. COMPARISON:  None. FINDINGS: Brain: Mild atrophy. Negative for hydrocephalus. Negative for acute infarct. Mild chronic white matter changes. Subtle hyper density in the right parietal periventricular white matter measuring approximately 7 mm. This does not appear to represent acute hemorrhage but could be subacute hemorrhage or iron deposition from cavernoma which is favored. Hyperdense tumor is also a possibility. Vascular: Atherosclerotic calcification. Negative for acute vascular thrombosis. Skull: Negative Sinuses/Orbits: Negative Other: None IMPRESSION: Mild atrophy and chronic microvascular ischemia in the white matter. No acute infarct. 7 mm subtle hyperdensity in the right parietal periventricular white matter, favor chronic cavernoma. Subacute hemorrhage and hyperdense tumor are possible. Therefore, MRI brain without with contrast recommended for further evaluation. Electronically Signed   By: Franchot Gallo M.D.   On: 09/28/2017 12:02   Ct Angio Neck W Or Wo Contrast  Result Date: 09/26/2017 CLINICAL DATA:  Syncopal/near syncopal event versus seizure. Scattered bilateral cerebral and cerebellar infarcts on MRI. EXAM: CT ANGIOGRAPHY NECK TECHNIQUE: Multidetector CT imaging of the neck was performed using the standard protocol during bolus administration of intravenous contrast. Multiplanar CT image reconstructions and MIPs were obtained to evaluate the vascular anatomy. Carotid stenosis measurements (when applicable) are obtained utilizing NASCET criteria, using the distal internal carotid diameter as the denominator. CONTRAST:  65 mL Isovue 370 COMPARISON:  None. FINDINGS: Aortic arch: Standard 3 vessel aortic arch with mild atherosclerotic plaque. Widely patent brachiocephalic and subclavian  arteries with scattered non stenotic plaque. Right carotid system: Patent without evidence of stenosis or dissection. Tortuous mid to distal cervical ICA. Left carotid system: Patent without evidence of significant stenosis or dissection. Mild-to-moderate predominantly calcified plaque in the proximal ICA. Tortuous distal cervical ICA. Vertebral arteries: Patent without evidence of significant stenosis or dissection. Mildly dominant left vertebral artery. Nonstenotic calcified plaque at the right vertebral artery origin. Skeleton: Severe right glenohumeral arthropathy. Severe diffuse cervical disc and right-sided facet degeneration. Other neck: Prior right thyroidectomy. Enlarged and heterogeneous left thyroid lobe with an underlying 4.5 cm mass which laterally deviates the common carotid artery and internal jugular vein and slightly deviates the trachea. Upper chest: Mild centrilobular and paraseptal emphysema. IMPRESSION: 1. Patent cervical carotid and vertebral arteries without stenosis or dissection. Nonstenotic plaque in the left ICA. 2.  Aortic Atherosclerosis (ICD10-I70.0). 3. Prior right thyroidectomy. 4.5 cm left thyroid mass. Recommend correlation with presumed prior outside thyroid imaging. Electronically Signed   By: Logan Bores M.D.   On: 09/14/2017 18:18   Ct Lumbar Spine Wo Contrast  Result Date: 09/13/2017 CLINICAL DATA:  Back pain.  Fall 2 weeks ago. EXAM: CT LUMBAR SPINE WITHOUT CONTRAST TECHNIQUE: Multidetector CT imaging of the lumbar spine was performed without intravenous contrast administration. Multiplanar CT image reconstructions were also generated. COMPARISON:  CT lumbar 09/04/2017, MRI lumbar 09/04/2017 FINDINGS: Segmentation: Normal Alignment: Moderate lumbar scoliosis unchanged. 5 mm anterolisthesis L3-4 unchanged. Vertebrae: Negative for fracture or mass Paraspinal and other soft tissues: Atherosclerotic calcification aorta and iliacs. No retroperitoneal mass. Disc levels: T12-L1:  Moderate disc and facet degeneration without  significant stenosis L1-2: Moderate disc and facet degeneration. Left foraminal narrowing due to spurring. L2-3: Advanced disc degeneration and spurring on the left causing left foraminal encroachment unchanged. Bilateral facet hypertrophy. Mild narrowing of the canal. Mild right foraminal narrowing. L3-4: 5 mm anterolisthesis with severe facet hypertrophy. Remote laminectomy. Severe facet hypertrophy bilaterally contributing to moderate to severe spinal stenosis. Severe subarticular stenosis bilaterally unchanged. L4-5: Disc degeneration and spurring right greater than left. Severe facet degeneration. Remote laminectomy. Marked bony overgrowth of the facet joints contributing to moderate spinal stenosis. Marked subarticular stenosis bilaterally. L5-S1: Disc degeneration and spurring. Severe facet degeneration. Asymmetric soft tissue in the right foramen suspicious for right foraminal disc protrusion. Correlate with right L5 radicular pain. Severe foraminal encroachment bilaterally due to spurring and disc. IMPRESSION: Advanced multilevel disc and facet degeneration throughout the lumbar spine with moderate scoliosis. Negative for fracture No significant change from prior studies. Electronically Signed   By: Franchot Gallo M.D.   On: 09/20/2017 11:58   Ct Lumbar Spine Wo Contrast  Result Date: 09/04/2017 CLINICAL DATA:  Witnessed fall Friday.  Not ambulatory. EXAM: CT LUMBAR SPINE WITHOUT CONTRAST TECHNIQUE: Multidetector CT imaging of the lumbar spine was performed without intravenous contrast administration. Multiplanar CT image reconstructions were also generated. COMPARISON:  Plain films 09/02/2017. FINDINGS: Segmentation: Standard. Alignment: Degenerative scoliosis convex RIGHT thoracolumbar junction. Mild anterolisthesis L3 on L4, 3 mm. Vertebrae: No acute fracture or focal pathologic process. Paraspinal and other soft tissues: Negative. Aortic atherosclerosis.  Marked bladder distention. There appears to be marked prostate enlargement. Disc levels: L1-L2: Advanced disc space narrowing with osseous spurring. Vacuum phenomenon. LEFT subarticular zone and foraminal zone narrowing. L2-L3: Moderate disc space narrowing. Osseous spurring. Facet arthropathy. Mild stenosis. No definite subarticular zone narrowing. Significant foraminal narrowing on the LEFT. L3-L4: Moderate disc space narrowing. 3 mm anterolisthesis. Calcified protrusion in the midline. Marked posterior element hypertrophy, primarily facet overgrowth. Severe stenosis. RIGHT greater than LEFT L4 nerve root impingement due to subarticular zone. The RIGHT L3 nerve root may be compressed in the foramen. L4-L5: Severe disc space narrowing with vacuum phenomenon. Osseous spurring. Advanced posterior element hypertrophy. Mild to moderate stenosis. RIGHT sided foraminal zone narrowing could affect the L4 nerve root. L5-S1: Moderate to advanced disc space narrowing. Facet arthropathy. No stenosis or subarticular zone narrowing, but LEFT-sided foraminal narrowing could affect the L5 nerve root. IMPRESSION: Multilevel spondylosis, with varying degrees of stenosis, subarticular zone narrowing and foraminal zone narrowing. 3 mm anterolisthesis L3-4 appears facet mediated. There is no significant slip to suggest ligamentous injury. No visible lower thoracic or lumbar spine compression fracture is evident. Aortic Atherosclerosis (ICD10-I70.0). Marked bladder distention, query bladder outlet obstruction Electronically Signed   By: Staci Righter M.D.   On: 09/04/2017 16:54   Mr Jodene Nam Head Wo Contrast  Result Date: 09/09/2017 CLINICAL DATA:  Near syncopal event today. Fall 2 weeks ago. Orthostatic in emergency department. EXAM: MRI HEAD WITHOUT AND WITH CONTRAST MRA HEAD WITHOUT CONTRAST TECHNIQUE: Multiplanar, multiecho pulse sequences of the brain and surrounding structures were obtained without and with intravenous contrast.  Angiographic images of the head were obtained using MRA technique without contrast. CONTRAST:  30mL MULTIHANCE GADOBENATE DIMEGLUMINE 529 MG/ML IV SOLN COMPARISON:  Head CT 09/16/2017 FINDINGS: MRI HEAD FINDINGS Multiple sequences are mildly to moderately motion degraded. Brain: There are numerous subcentimeter foci of acute infarction scattered throughout both cerebral hemispheres as well as left and possibly right cerebellar hemispheres. The there is cortical involvement in the frontal, parietal, and occipital  lobes bilaterally, and there is also involvement of the left centrum semiovale. There is a small chronic infarct in the right cerebellum. Scattered T2 hyperintensities in the cerebral white matter and pons are nonspecific but compatible with mild chronic small vessel ischemic disease. There is mild-to-moderate cerebral atrophy. A 5 mm T2 hyperintense focus with prominent susceptibility artifact in the right parieto-occipital white matter corresponds to the subtle hyperattenuation on CT and is without associated enhancement or edema. Chronic microhemorrhages are noted in the right cerebellum and right parietal lobe. Vascular: Major intracranial vascular flow voids are preserved. Skull and upper cervical spine: Unremarkable bone marrow signal. Sinuses/Orbits: Unremarkable orbits. Paranasal sinuses and mastoid air cells are clear. Other: None. MRA HEAD FINDINGS There is intermittent moderate motion artifact. The visualized distal vertebral arteries are patent to the basilar with the left being mildly dominant. Patent left PICA and bilateral SCA origins are identified. The basilar artery is widely patent and tortuous. Posterior communicating arteries are not identified. PCAs are patent without evidence of significant proximal stenosis. The internal carotid arteries are patent from skull base to carotid termini without evidence of significant stenosis. ACAs and MCAs are patent without evidence of proximal branch  occlusion, A1 stenosis, or M1 stenosis. There is artifactual signal loss in bilateral M2 greater than A2 branches which limits assessment. No aneurysm is identified. IMPRESSION: 1. 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 2. Subcentimeter focus of chronic hemorrhage in the right parieto-occipital white matter compatible with a cavernoma. 3. Mild chronic small vessel ischemic disease and mild-to-moderate cerebral atrophy. 4. Patent circle of Willis without proximal branch occlusion or flow limiting proximal stenosis. Electronically Signed   By: Logan Bores M.D.   On: 09/28/2017 15:26   Mr Brain W And Wo Contrast  Result Date: 09/19/2017 CLINICAL DATA:  Near syncopal event today. Fall 2 weeks ago. Orthostatic in emergency department. EXAM: MRI HEAD WITHOUT AND WITH CONTRAST MRA HEAD WITHOUT CONTRAST TECHNIQUE: Multiplanar, multiecho pulse sequences of the brain and surrounding structures were obtained without and with intravenous contrast. Angiographic images of the head were obtained using MRA technique without contrast. CONTRAST:  57mL MULTIHANCE GADOBENATE DIMEGLUMINE 529 MG/ML IV SOLN COMPARISON:  Head CT 09/29/2017 FINDINGS: MRI HEAD FINDINGS Multiple sequences are mildly to moderately motion degraded. Brain: There are numerous subcentimeter foci of acute infarction scattered throughout both cerebral hemispheres as well as left and possibly right cerebellar hemispheres. The there is cortical involvement in the frontal, parietal, and occipital lobes bilaterally, and there is also involvement of the left centrum semiovale. There is a small chronic infarct in the right cerebellum. Scattered T2 hyperintensities in the cerebral white matter and pons are nonspecific but compatible with mild chronic small vessel ischemic disease. There is mild-to-moderate cerebral atrophy. A 5 mm T2 hyperintense focus with prominent susceptibility artifact  in the right parieto-occipital white matter corresponds to the subtle hyperattenuation on CT and is without associated enhancement or edema. Chronic microhemorrhages are noted in the right cerebellum and right parietal lobe. Vascular: Major intracranial vascular flow voids are preserved. Skull and upper cervical spine: Unremarkable bone marrow signal. Sinuses/Orbits: Unremarkable orbits. Paranasal sinuses and mastoid air cells are clear. Other: None. MRA HEAD FINDINGS There is intermittent moderate motion artifact. The visualized distal vertebral arteries are patent to the basilar with the left being mildly dominant. Patent left PICA and bilateral SCA origins are identified. The basilar artery is widely patent and tortuous. Posterior communicating arteries are not identified. PCAs  are patent without evidence of significant proximal stenosis. The internal carotid arteries are patent from skull base to carotid termini without evidence of significant stenosis. ACAs and MCAs are patent without evidence of proximal branch occlusion, A1 stenosis, or M1 stenosis. There is artifactual signal loss in bilateral M2 greater than A2 branches which limits assessment. No aneurysm is identified. IMPRESSION: 1. 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 2. Subcentimeter focus of chronic hemorrhage in the right parieto-occipital white matter compatible with a cavernoma. 3. Mild chronic small vessel ischemic disease and mild-to-moderate cerebral atrophy. 4. Patent circle of Willis without proximal branch occlusion or flow limiting proximal stenosis. Electronically Signed   By: Logan Bores M.D.   On: 09/19/2017 15:26   Mr Thoracic Spine Wo Contrast  Result Date: 09/04/2017 CLINICAL DATA:  Patient fell 09/02/2017. Severe back pain. Difficult to ambulate. EXAM: MRI THORACIC AND LUMBAR SPINE WITHOUT CONTRAST TECHNIQUE: Multiplanar and multiecho pulse sequences  of the thoracic and lumbar spine were obtained without intravenous contrast. COMPARISON:  Plain films 09/02/2017. CT lumbar spine earlier today. FINDINGS: MRI THORACIC SPINE FINDINGS Alignment:  Physiologic. Vertebrae: No acute compression fracture or worrisome osseous lesion. Cord:  No significant cord compression or abnormal cord signal. Paraspinal and other soft tissues: Large goiter, substernal nodule extending on the LEFT. Median sternotomy. Gallstone. RIGHT pleural effusion. RIGHT hepatic cyst, approximate 1 x 2 cm. Disc levels: At T7-8 there is a LEFT paracentral protrusion. This does not result in stenosis or cord flattening. At T8-9 there is a central protrusion associated with posterior element hypertrophy. The T8-9 disc is mildly hyperintense, but there are no signs of infection. There is a RIGHT-sided facet joint effusion associated with asymmetric bony overgrowth. At T10-11, there is a shallow central protrusion with borderline stenosis. At T11-12, there is asymmetric facet arthropathy on the LEFT. MRI LUMBAR SPINE FINDINGS Segmentation:  Standard. Alignment: 3 mm anterolisthesis L3-4 is facet mediated there is degenerative scoliosis convex RIGHT at the thoracolumbar junction, approximately 30 degrees. Vertebrae: No compression fracture or worrisome osseous lesion. Endplate reactive changes. Conus medullaris and cauda equina: Conus extends to the L1-L2 level. The conus appears normal. The cauda equina is compressed and displaced at multiple levels, which appears to be due to a combination of spondylosis and scoliosis. There is some areas, such as opposite L4 (series 6, image 22) where the CSF has an appearance mimicking a subdural effusion or hygroma, there are no visible signs of intraspinal hematoma. Paraspinal and other soft tissues: The bladder is no longer distended. There is no retroperitoneal adenopathy. Disc levels: L1-L2: Disc space narrowing. Shallow central protrusion. Asymmetric facet  arthropathy on the LEFT. Foraminal narrowing could affect the LEFT L1 nerve root. L2-L3: Disc space narrowing. Annular bulge with osseous spurring. Posterior element hypertrophy. Moderate stenosis. BILATERAL L2 and L3 nerve root impingement are possible. L3-L4: Disc space narrowing. 3 mm anterolisthesis. Central protrusion. Posterior element hypertrophy. Severe stenosis. BILATERAL L4 nerve root impingement in the canal. RIGHT L3 nerve root impingement in the foramen. Interspinous bursa edema, consistent with Baastrup's disease. L4-L5: Disc space narrowing osseous spurring. Annular bulge. Posterior element hypertrophy. Moderate stenosis. RIGHT greater than LEFT L5 nerve root impingement. L5-S1: Disc hyperintensity without signs of infection. Central protrusion. Posterior element hypertrophy. Mild stenosis. BILATERAL subarticular zone and foraminal zone narrowing affecting the L5 and S1 nerve roots. IMPRESSION: MR THORACIC SPINE IMPRESSION No thoracic spine compression fracture or visible hematoma. Multilevel spondylosis, worst at T8-9. mild stenosis at  this level without thoracic cord compression. MR LUMBAR SPINE IMPRESSION No lumbar spine compression fracture or visible hematoma. Mild anterolisthesis at L3-4 is degenerative, not posttraumatic. Severe spinal stenosis at L3-4 relates to slip, posterior element hypertrophy, and central protrusion, affecting the L3 and L4 nerve roots. See discussion above. Moderate stenosis at L4-5 and L5-S1, related to disc space narrowing, bony overgrowth, disc material, and posterior element hypertrophy. Electronically Signed   By: Staci Righter M.D.   On: 09/04/2017 19:53   Mr Lumbar Spine Wo Contrast  Result Date: 09/04/2017 CLINICAL DATA:  Patient fell 09/02/2017. Severe back pain. Difficult to ambulate. EXAM: MRI THORACIC AND LUMBAR SPINE WITHOUT CONTRAST TECHNIQUE: Multiplanar and multiecho pulse sequences of the thoracic and lumbar spine were obtained without intravenous  contrast. COMPARISON:  Plain films 09/02/2017. CT lumbar spine earlier today. FINDINGS: MRI THORACIC SPINE FINDINGS Alignment:  Physiologic. Vertebrae: No acute compression fracture or worrisome osseous lesion. Cord:  No significant cord compression or abnormal cord signal. Paraspinal and other soft tissues: Large goiter, substernal nodule extending on the LEFT. Median sternotomy. Gallstone. RIGHT pleural effusion. RIGHT hepatic cyst, approximate 1 x 2 cm. Disc levels: At T7-8 there is a LEFT paracentral protrusion. This does not result in stenosis or cord flattening. At T8-9 there is a central protrusion associated with posterior element hypertrophy. The T8-9 disc is mildly hyperintense, but there are no signs of infection. There is a RIGHT-sided facet joint effusion associated with asymmetric bony overgrowth. At T10-11, there is a shallow central protrusion with borderline stenosis. At T11-12, there is asymmetric facet arthropathy on the LEFT. MRI LUMBAR SPINE FINDINGS Segmentation:  Standard. Alignment: 3 mm anterolisthesis L3-4 is facet mediated there is degenerative scoliosis convex RIGHT at the thoracolumbar junction, approximately 30 degrees. Vertebrae: No compression fracture or worrisome osseous lesion. Endplate reactive changes. Conus medullaris and cauda equina: Conus extends to the L1-L2 level. The conus appears normal. The cauda equina is compressed and displaced at multiple levels, which appears to be due to a combination of spondylosis and scoliosis. There is some areas, such as opposite L4 (series 6, image 22) where the CSF has an appearance mimicking a subdural effusion or hygroma, there are no visible signs of intraspinal hematoma. Paraspinal and other soft tissues: The bladder is no longer distended. There is no retroperitoneal adenopathy. Disc levels: L1-L2: Disc space narrowing. Shallow central protrusion. Asymmetric facet arthropathy on the LEFT. Foraminal narrowing could affect the LEFT L1  nerve root. L2-L3: Disc space narrowing. Annular bulge with osseous spurring. Posterior element hypertrophy. Moderate stenosis. BILATERAL L2 and L3 nerve root impingement are possible. L3-L4: Disc space narrowing. 3 mm anterolisthesis. Central protrusion. Posterior element hypertrophy. Severe stenosis. BILATERAL L4 nerve root impingement in the canal. RIGHT L3 nerve root impingement in the foramen. Interspinous bursa edema, consistent with Baastrup's disease. L4-L5: Disc space narrowing osseous spurring. Annular bulge. Posterior element hypertrophy. Moderate stenosis. RIGHT greater than LEFT L5 nerve root impingement. L5-S1: Disc hyperintensity without signs of infection. Central protrusion. Posterior element hypertrophy. Mild stenosis. BILATERAL subarticular zone and foraminal zone narrowing affecting the L5 and S1 nerve roots. IMPRESSION: MR THORACIC SPINE IMPRESSION No thoracic spine compression fracture or visible hematoma. Multilevel spondylosis, worst at T8-9. mild stenosis at this level without thoracic cord compression. MR LUMBAR SPINE IMPRESSION No lumbar spine compression fracture or visible hematoma. Mild anterolisthesis at L3-4 is degenerative, not posttraumatic. Severe spinal stenosis at L3-4 relates to slip, posterior element hypertrophy, and central protrusion, affecting the L3 and L4 nerve roots. See  discussion above. Moderate stenosis at L4-5 and L5-S1, related to disc space narrowing, bony overgrowth, disc material, and posterior element hypertrophy. Electronically Signed   By: Staci Righter M.D.   On: 09/04/2017 19:53   Dg Chest Port 1 View  Result Date: 09/19/2017 CLINICAL DATA:  Fever and leukocytosis. EXAM: PORTABLE CHEST 1 VIEW COMPARISON:  09/04/2017 FINDINGS: 1234 hours. The lungs are clear without focal pneumonia, edema, pneumothorax or pleural effusion. The cardiopericardial silhouette is within normal limits for size. Degenerative changes noted in the shoulders bilaterally status post  median sternotomy. Telemetry leads overlie the chest. IMPRESSION: Low lung volumes without acute findings. Electronically Signed   By: Misty Stanley M.D.   On: 09/21/2017 12:50    Lab Data:  CBC: Recent Labs  Lab 09/04/2017 1102 09/19/17 1103  WBC 19.9* 23.3*  NEUTROABS 17.2*  --   HGB 14.4 11.9*  HCT 43.8 36.0*  MCV 81.0 81.4  PLT 160 010   Basic Metabolic Panel: Recent Labs  Lab 09/17/2017 1102 09/19/17 1103  NA 136 136  K 4.4 4.0  CL 97* 104  CO2 26 21*  GLUCOSE 189* 185*  BUN 62* 38*  CREATININE 1.55* 1.17  CALCIUM 9.7 8.6*   GFR: Estimated Creatinine Clearance: 57.2 mL/min (by C-G formula based on SCr of 1.17 mg/dL). Liver Function Tests: Recent Labs  Lab 09/04/2017 1102  AST 84*  ALT 151*  ALKPHOS 152*  BILITOT 1.0  PROT 6.5  ALBUMIN 2.5*   No results for input(s): LIPASE, AMYLASE in the last 168 hours. No results for input(s): AMMONIA in the last 168 hours. Coagulation Profile: No results for input(s): INR, PROTIME in the last 168 hours. Cardiac Enzymes: Recent Labs  Lab 09/30/2017 1102 09/07/2017 1648 09/07/2017 0417  CKTOTAL  --   --  <5*  TROPONINI 0.05* 0.05*  --    BNP (last 3 results) No results for input(s): PROBNP in the last 8760 hours. HbA1C: Recent Labs    09/04/2017 0417  HGBA1C 7.4*   CBG: No results for input(s): GLUCAP in the last 168 hours. Lipid Profile: Recent Labs    09/10/2017 0417  CHOL 108  HDL 18*  LDLCALC 67  TRIG 114  CHOLHDL 6.0   Thyroid Function Tests: No results for input(s): TSH, T4TOTAL, FREET4, T3FREE, THYROIDAB in the last 72 hours. Anemia Panel: No results for input(s): VITAMINB12, FOLATE, FERRITIN, TIBC, IRON, RETICCTPCT in the last 72 hours. Urine analysis:    Component Value Date/Time   COLORURINE YELLOW 09/21/2017 [place] 09/09/2017 1245   LABSPEC 1.018 09/07/2017 1245   PHURINE 5.0 09/29/2017 1245   GLUCOSEU 50 (A) 09/06/2017 1245   HGBUR SMALL (A) 09/13/2017 1245   BILIRUBINUR  NEGATIVE 09/03/2017 1245   KETONESUR NEGATIVE 09/29/2017 1245   PROTEINUR NEGATIVE 09/20/2017 1245   UROBILINOGEN 1.0 08/14/2011 0959   NITRITE NEGATIVE 09/05/2017 1245   LEUKOCYTESUR NEGATIVE 09/14/2017 1245     Ripudeep Rai M.D. [facility] 09/23/2017, 2:01 PM  Pager: [phone] Between 7am to 7pm - call Pager - [phone]  After 7pm go to [email or web address] - password [email or web address]  Call night coverage person covering after 7pm

## 2017-09-20 NOTE — Progress Notes (Signed)
Pharmacy Antibiotic Note  Joe Shah is a 77 y.o. male admitted on 09/21/2017 with bacteremia.  Pharmacy has been consulted for daptomycin dosing. Also on ampicillin per ID.  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 Will need TEE per ID . Per discussion with Dr. Tommy Medal, will cover with ampicillin and daptomycin until speciation/sensitivity results since patient likely has septic emboli. CK this AM is <5, SCr improved to 1.17.  Plan: Ampicillin to 2g IV q4h for improving renal function Daptomycin 600mg  (8mg /kg) IV q24h Follow c/s, renal function, CK qMonday, TEE, ID plans Statin on hold  Height: 6' (182.9 cm) Weight: 166 lb 0.1 oz (75.3 kg) IBW/kg (Calculated) : 77.6  Temp (24hrs), Avg:98.8 F (37.1 C), Min:98 F (36.7 C), Max:99.3 F (37.4 C)  Recent Labs  Lab 09/27/2017 1102 09/30/2017 1116 09/13/2017 1319 09/19/17 1103  WBC 19.9*  --   --  23.3*  CREATININE 1.55*  --   --  1.17  LATICACIDVEN  --  2.14* 1.60  --     Estimated Creatinine Clearance: 57.2 mL/min (by C-G formula based on SCr of 1.17 mg/dL).    No Known Allergies  Antimicrobials this admission: Ampicillin 2/17 >>  Daptomycin 2/17 >>   Dose adjustments this admission: 2/18 - increased ampicillin to 2g IV q4h for CrCl>50  Microbiology results: 2/16 BCx: GPC 1/2 2/16 BCID: enterococcus 2/16 UCx:     Elicia Lamp, PharmD, BCPS Clinical Pharmacist Clinical phone for 09/10/2017 until 3:30pm: [phone] If after 3:30pm, please call main pharmacy at: [phone] 09/19/2017 10:18 AM

## 2017-09-20 NOTE — Progress Notes (Signed)
PT Cancellation Note  Patient Details Name: Joe Shah MRN: [id number] DOB: [birth year]   Cancelled Treatment:    Reason Eval/Treat Not Completed: (P) Patient at procedure or test/unavailable Pt having endoscopic procedure. PT will follow back for treatment tomorrow.  Sheina Mcleish B. Migdalia Dk PT, DPT Acute Rehabilitation  [phone] Pager [phone]     [place] 09/10/2017, 2:20 PM

## 2017-09-20 NOTE — Procedures (Signed)
ELECTROENCEPHALOGRAM REPORT  Date of Study: 09/19/2017  Patient's Name: CABE LASHLEY MRN: [id number] Date of Birth: 1940-10-06  Referring Provider: Dr. Loma Boston  Clinical History: This is a 77 year old man with altered mental status.  Medications: Keppra  Technical Summary: A multichannel digital EEG recording measured by the international 10-20 system with electrodes applied with paste and impedances below 5000 ohms performed as portable with EKG monitoring in a predominantly drowsy and asleep patient.  Hyperventilation and photic stimulation were not performed.  The digital EEG was referentially recorded, reformatted, and digitally filtered in a variety of bipolar and referential montages for optimal display.   Description: The patient is predominantly drowsy and asleep during the recording.  During brief period of wakefulness, there is a poorly sustained symmetric, low voltage 7 Hz posterior dominant rhythm that poorly attenuates with eye opening and eye closure. This is admixed with a small amount of diffuse 4-5 Hz theta slowing of the background.  During drowsiness and sleep, there is an increase in theta and delta slowing of the background with poorly formed vertex waves and sleep spindles were seen.  Hyperventilation and photic stimulation were not performed. There was a single sharp transient seen on the right frontal region that appears artifactual. There were no clear epileptiform discharges or electrographic seizures seen.    EKG lead was unremarkable.  Impression: This predominantly drowsy and asleep EEG is abnormal due to mild diffuse background slowing.   Clinical Correlation of the above findings indicates diffuse cerebral dysfunction that is non-specific in etiology and can be seen with hypoxic/ischemic injury, toxic/metabolic encephalopathies, neurodegenerative disorders, medication effect, or due to excessive drowsiness.  The absence of epileptiform discharges does  not rule out a clinical diagnosis of epilepsy.  Clinical correlation is advised.   Ellouise Newer, M.D.

## 2017-09-20 NOTE — Progress Notes (Signed)
EEG completed, results pending. 

## 2017-09-20 NOTE — Progress Notes (Addendum)
[name] for Infectious Disease    Date of Admission:  09/29/2017   Total days of antibiotics 2        Day 2 amp        Day 2 daptomycin           ID: Joe Shah is a 77 y.o. 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 No significant regurgitation noted. 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   I asked the family if their father had been complaining of worsening shortness of breath, decrease exercise indurance which they felt may have happened over several months  Denied recent fever chills. Has had several falls but not loss of consciousness.   Medications:  .  stroke: mapping our early stages of recovery book   Does not apply Once  . aspirin  300 mg Rectal Daily   Or  . aspirin  325 mg Oral Daily  . enoxaparin (LOVENOX) injection  40 mg Subcutaneous Q24H  . simvastatin  20 mg Oral QPM  . tamsulosin  0.4 mg Oral Daily    Objective: Vital signs in last 24 hours: Temp:  [98 F (36.7 C)-99.5 F (37.5 C)] 98.7 F (37.1 C) (02/18 1603) Pulse Rate:  [88-112] 98 (02/18 1603) Resp:  [18-33] 26 (02/18 1603) BP: (104-147)/(47-116) 125/67 (02/18 1603) SpO2:  [90 %-99 %] 90 % (02/18 1603)  gen = sleeping, resting without distress. Open eyes to name HEENT = MMM, OP clear Neck = supple, no LAD pulm = CTAB no w/c/r Cors = + soft  systolic murmur BH at LUSB Ext= no c/c/e Skin = warm Neuro =deferred   Lab Results Recent Labs    09/10/2017 1102 09/19/17 1103  WBC 19.9* 23.3*  HGB 14.4 11.9*  HCT 43.8 36.0*  NA 136 136  K 4.4 4.0  CL 97* 104  CO2 26 21*  BUN 62* 38*  CREATININE 1.55* 1.17   Liver Panel Recent Labs    09/29/2017 1102  PROT 6.5  ALBUMIN 2.5*  AST 84*  ALT 151*  ALKPHOS 152*  BILITOT 1.0    Microbiology: 2/16 blood cx e.faecalis 2/18 blood cx pending Studies/Results: Dg Chest Port 1 View  Result Date: 09/04/2017 CLINICAL DATA:  Tachypnea. EXAM: PORTABLE CHEST 1 VIEW COMPARISON:  09/11/2017 and 09/04/2017 radiographs. FINDINGS: 1510 hr. Lower lung volumes. The heart size and mediastinal contours are stable status post median sternotomy and aortic valve replacement. There is aortic atherosclerosis. There is new patchy left lower lobe airspace disease. There are possible small bilateral pleural effusions. No edema or pneumothorax. Advanced glenohumeral arthropathic changes are present bilaterally. IMPRESSION: New patchy left lower lobe opacity may reflect atelectasis or early pneumonia. Possible small bilateral pleural effusions. Electronically Signed   By: Richardean Sale M.D.   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 The patient has e.faecalis which is usually ampicillin sensitive  Will recommend to change abtx to the following ampicillin plus gentamicin at this time to see if kidney function can tolerate. If see a rise in Cr then can switch to a dual b-lactam regimen of amp plus high dose ceftriaxone which also is in treatment guidelines.  Please have CT surgery evaluate patient for any surgical options, though I suspect it maybe limited options his AVR was originally done by Dr Servando Snare in 2013. Sees dr Grace Bushy, his cardiologist on annual basis, Last seen in may 2018  Has repeat blood cx  pending. Please wait for any picc line placement until can document bacteremia has cleared.  Cerebral infarction = likely from septic emboli originating from PVE. Currently followed by stroke team.   transaminitis = possibly due to hypotension. Continue to monitor  Spent 40 min with family discussing recent findings  [facility] for Infectious Diseases Cell: [phone] Pager: [phone]  09/12/2017, 6:02 PM

## 2017-09-21 ENCOUNTER — Other Ambulatory Visit: Payer: Self-pay

## 2017-09-21 ENCOUNTER — Encounter (HOSPITAL_COMMUNITY): Payer: Self-pay | Admitting: Cardiology

## 2017-09-21 ENCOUNTER — Inpatient Hospital Stay (HOSPITAL_COMMUNITY): Payer: Medicare Other

## 2017-09-21 DIAGNOSIS — I350 Nonrheumatic aortic (valve) stenosis: Secondary | ICD-10-CM

## 2017-09-21 DIAGNOSIS — I480 Paroxysmal atrial fibrillation: Secondary | ICD-10-CM

## 2017-09-21 DIAGNOSIS — I10 Essential (primary) hypertension: Secondary | ICD-10-CM

## 2017-09-21 LAB — BASIC METABOLIC PANEL
Anion gap: 12 (ref 5–15)
BUN: 22 mg/dL — AB (ref 6–20)
CHLORIDE: 106 mmol/L (ref 101–111)
CO2: 20 mmol/L — AB (ref 22–32)
Calcium: 8.2 mg/dL — ABNORMAL LOW (ref 8.9–10.3)
Creatinine, Ser: 1.01 mg/dL (ref 0.61–1.24)
GFR calc Af Amer: >60 mL/min (ref >60)
GLUCOSE: 117 mg/dL — AB (ref 65–99)
POTASSIUM: 3.4 mmol/L — AB (ref 3.5–5.1)
Sodium: 138 mmol/L (ref 135–145)

## 2017-09-21 LAB — URINE CULTURE

## 2017-09-21 LAB — CULTURE, BLOOD (ROUTINE X 2): Special Requests: ADEQUATE

## 2017-09-21 LAB — HCV COMMENT:

## 2017-09-21 LAB — TROPONIN I
Troponin I: <0.03 ng/mL (ref <0.03)
Troponin I: <0.03 ng/mL (ref <0.03)

## 2017-09-21 LAB — URIC ACID: URIC ACID, SERUM: 3.4 mg/dL — AB (ref 4.4–7.6)

## 2017-09-21 LAB — C-REACTIVE PROTEIN: CRP: 20.6 mg/dL — AB (ref <1.0)

## 2017-09-21 LAB — HEPATITIS C ANTIBODY (REFLEX): HCV Ab: 0.1 s/co ratio (ref 0.0–0.9)

## 2017-09-21 LAB — SEDIMENTATION RATE: SED RATE: 85 mm/h — AB (ref 0–16)

## 2017-09-21 MED ORDER — POTASSIUM CHLORIDE CRYS ER 20 MEQ PO TBCR
40.0000 meq | EXTENDED_RELEASE_TABLET | Freq: Once | ORAL | Status: AC
  Administered 2017-09-21: 40 meq via ORAL
  Filled 2017-09-21: qty 2

## 2017-09-21 NOTE — Progress Notes (Signed)
Paged MD about Pt left shoulder pain. 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 Left shoulder/arm hurts when moving. Xray has been.

## 2017-09-21 NOTE — Progress Notes (Signed)
RN received call from telemetry informing her pt has been having frequent HR brady and longer pauses. Longest pause was 3.08sec at 1538 and at 1605 pt had HR 37 with 2.44sec pause. Dr. Tana Coast notified. Pt alert and verbally responsive in bed with call light within reach and family at bedside. Will closely monitor pt. Delia Heady RN

## 2017-09-21 NOTE — Progress Notes (Signed)
New order received from Dr. Tana Coast. Pt upgraded to progressive care; pt placed on continuous monitor with frequent VS. Pt family remain at bedside. Will continue to closely monitor pt. Delia Heady RN

## 2017-09-21 NOTE — Progress Notes (Signed)
NEUROHOSPITALISTS STROKE TEAM - DAILY PROGRESS NOTE   ADMISSION HISTORY: Joe Shah is an 77 y.o. 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  The patient is a poor historian. According to chart the patient was getting dressed in the morning.His son was helping him when he suddenly fell back on the bed became extremely rigid. Last about 45 seconds followed by 5 minutes of confusion disorientation. Patient taken to ER. 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   Date last known well: 2.16-19 Time last known well: morning tPA Given: no, outside windiw NIHSS: 5 Baseline MRS 1  SUBJECTIVE (INTERVAL HISTORY)  Family is at the bedside. Patient is found laying in bed in NAD. Overall he feels his condition is gradually improving. Voices no new complaints. No new events reported overnight. TEE done yesterday shows vegetations on both cusps of prosteheic aortic valve   OBJECTIVE Lab Results: CBC:  Recent Labs  Lab 09/04/2017 1102 09/19/17 1103  WBC 19.9* 23.3*  HGB 14.4 11.9*  HCT 43.8 36.0*  MCV 81.0 81.4  PLT 160 180   BMP: Recent Labs  Lab 09/09/2017 1102 09/19/17 1103 09/21/17 0217  NA 136 136 138  K 4.4 4.0 3.4*  CL 97* 104 106  CO2 26 21* 20*  GLUCOSE 189* 185* 117*  BUN 62* 38* 22*  CREATININE 1.55* 1.17 1.01  CALCIUM 9.7 8.6* 8.2*   Liver Function Tests:  Recent Labs  Lab 09/10/2017 1102  AST 84*  ALT 151*  ALKPHOS 152*  BILITOT 1.0  PROT 6.5  ALBUMIN 2.5*   Cardiac Enzymes:  Recent Labs  Lab 09/07/2017 1102 09/25/2017 1648 09/27/2017 0417 09/23/2017 2049 09/21/17 0217  09/21/17 0829  CKTOTAL  --   --  <5*  --   --   --   TROPONINI 0.05* 0.05*  --  <0.03 <0.03 <0.03   Microbiology:  Recent Results (from the past 240 hour(s))  Blood culture (routine x 2)     Status: Abnormal   Collection Time: 09/04/2017 11:40 AM  Result Value Ref Range Status   Specimen Description   Final    BLOOD LEFT ANTECUBITAL BOTTLES DRAWN AEROBIC AND ANAEROBIC Performed at [facility], [address], [place], [place] [address]    Special Requests   Final    Blood Culture adequate volume Performed at [facility], [address], [place], [place] [address]    Culture  Setup Time   Final    GRAM POSITIVE COCCI Gram Stain Report Called to,Read Back By and Verified With: LURZ,G @ 3086 ON 09/19/17 BY JUW GS DONE @ [facility] GRAM STAIN REVIEWED-AGREE WITH RESULT 0623 09/19/17 [facility] CRITICAL RESULT CALLED TO, READ BACK BY AND VERIFIED WITH: G. ABBOTT, RPHARMD AT 0810 ON 09/19/17 BY C. JESSUP, MLT. Performed at [facility], [place] [address], [place], [place] [address]    Culture ENTEROCOCCUS FAECALIS (A)  Final   Report Status 09/21/2017 FINAL  Final   Organism ID, Bacteria ENTEROCOCCUS FAECALIS  Final      Susceptibility   Enterococcus faecalis - MIC*    AMPICILLIN <=2 SENSITIVE Sensitive     VANCOMYCIN 2 SENSITIVE Sensitive     GENTAMICIN SYNERGY SENSITIVE Sensitive     *  ENTEROCOCCUS FAECALIS  Blood Culture ID Panel (Reflexed)     Status: Abnormal   Collection Time: 09/19/2017 11:40 AM  Result Value Ref Range Status   Enterococcus species DETECTED (A) NOT DETECTED Final    Comment: CRITICAL RESULT CALLED TO, READ BACK BY AND VERIFIED WITH: G. ABBOTT, RPHARMD AT 0810 ON 09/19/17 BY C. JESSUP, MLT.    Vancomycin resistance NOT DETECTED NOT DETECTED Final   Listeria monocytogenes NOT DETECTED NOT DETECTED Final   Staphylococcus species NOT DETECTED NOT DETECTED Final   Staphylococcus aureus NOT DETECTED NOT DETECTED Final   Streptococcus species NOT DETECTED NOT DETECTED  Final   Streptococcus agalactiae NOT DETECTED NOT DETECTED Final   Streptococcus pneumoniae NOT DETECTED NOT DETECTED Final   Streptococcus pyogenes NOT DETECTED NOT DETECTED Final   Acinetobacter baumannii NOT DETECTED NOT DETECTED Final   Enterobacteriaceae species NOT DETECTED NOT DETECTED Final   Enterobacter cloacae complex NOT DETECTED NOT DETECTED Final   Escherichia coli NOT DETECTED NOT DETECTED Final   Klebsiella oxytoca NOT DETECTED NOT DETECTED Final   Klebsiella pneumoniae NOT DETECTED NOT DETECTED Final   Proteus species NOT DETECTED NOT DETECTED Final   Serratia marcescens NOT DETECTED NOT DETECTED Final   Haemophilus influenzae NOT DETECTED NOT DETECTED Final   Neisseria meningitidis NOT DETECTED NOT DETECTED Final   Pseudomonas aeruginosa NOT DETECTED NOT DETECTED Final   Candida albicans NOT DETECTED NOT DETECTED Final   Candida glabrata NOT DETECTED NOT DETECTED Final   Candida krusei NOT DETECTED NOT DETECTED Final   Candida parapsilosis NOT DETECTED NOT DETECTED Final   Candida tropicalis NOT DETECTED NOT DETECTED Final    Comment: Performed at [facility], [place] [address], [place], [place] [address]  Blood culture (routine x 2)     Status: Abnormal   Collection Time: 09/06/2017 11:49 AM  Result Value Ref Range Status   Specimen Description   Final    BLOOD LEFT HAND BOTTLES DRAWN AEROBIC AND ANAEROBIC Performed at [facility], [address] [place], [place] [address]    Special Requests   Final    Blood Culture results may not be optimal due to an inadequate volume of blood received in culture bottles Performed at [facility], [address] [place], [place] [address]    Culture  Setup Time   Final    GRAM POSITIVE COCCI Gram Stain Report Called to,Read Back By and Verified With: LURZ,G @ 5681 ON 09/19/17 BY JUW GS DONE @ [facility] GRAM STAIN [place] WITH RESULT 0619 09/19/17 M.CAMPBE[place] IN BOTH AEROBIC AND ANAEROBIC BOTTLES    Culture (A)  Final     ENTEROCOCCUS FAECALIS SUSCEPTIBILITIES PERFORMED ON PREVIOUS CULTURE WITHIN THE LAST 5 DAYS. Performed at [facility], [place] [address] [place], [place] [address]    Report Status 09/21/2017 FINAL  Final  Urine Culture     Status: Abnormal   Collection Time: 09/17/2017 12:45 PM  Result Value Ref Range Status   Specimen Description   Final    URINE, CATHETERIZED Performed at [facility], [address], [place], [place] [address]    Special Requests   Final    NONE Performed at [facility], [address], [place], [place] [address]    Culture 70,000 COLONIES/mL ENTEROCOCCUS FAECALIS (A)  Final   Report Status 09/21/2017 FINAL  Final   Organism ID, Bacteria ENTEROCOCCUS FAECALIS (A)  Final      Susceptibility   Enterococcus faecalis - MIC*    AMPICILLIN <=  2 SENSITIVE Sensitive     LEVOFLOXACIN 1 SENSITIVE Sensitive     NITROFURANTOIN <=16 SENSITIVE Sensitive     VANCOMYCIN 2 SENSITIVE Sensitive     * 70,000 COLONIES/mL ENTEROCOCCUS FAECALIS  Culture, blood (routine x 2)     Status: None (Preliminary result)   Collection Time: 09/07/2017 11:59 AM  Result Value Ref Range Status   Specimen Description BLOOD LEFT ANTECUBITAL  Final   Special Requests   Final    BOTTLES DRAWN AEROBIC AND ANAEROBIC Blood Culture results may not be optimal due to an excessive volume of blood received in culture bottles   Culture   Final    NO GROWTH 1 DAY Performed at [facility], [place] [address], [place], [place] [address]    Report Status PENDING  Incomplete  Culture, blood (routine x 2)     Status: None (Preliminary result)   Collection Time: 09/15/2017 12:10 PM  Result Value Ref Range Status   Specimen Description BLOOD LEFT HAND  Final   Special Requests   Final    BOTTLES DRAWN AEROBIC AND ANAEROBIC Blood Culture adequate volume   Culture   Final    NO GROWTH 1 DAY Performed at [facility], [place] [address] [place], [place] [address]    Report Status PENDING  Incomplete    Urinalysis:  Recent Labs  Lab 09/11/2017 [place]  LABSPEC 1.018  PHURINE 5.0  GLUCOSEU 50*  HGBUR SMALL*  BILIRUBINUR NEGATIVE  KETONESUR NEGATIVE  PROTEINUR NEGATIVE  NITRITE NEGATIVE  LEUKOCYTESUR NEGATIVE    PHYSICAL EXAM Temp:  [98.2 F (36.8 C)-103.1 F (39.5 C)] 99.1 F (37.3 C) (02/19 1600) Pulse Rate:  [48-98] 49 (02/19 1600) Resp:  [18-22] 18 (02/19 1600) BP: (109-125)/(57-76) 109/64 (02/19 1600) SpO2:  [91 %-100 %] 100 % (02/19 1600) General - Well nourished, well developed elderly Caucasian male, in no apparent distress HEENT-  Normocephalic,  Cardiovascular - Regular rate and rhythm  Respiratory - Lungs clear bilaterally. No wheezing. Abdomen - soft and non-tender, BS normal Extremities- no edema or cyanosis Mental Status: Alert,confused.Oriented to himself and place  Speech fluent without evidence of aphasia. Able to follow simple commands without difficulty. Diminished attention, registration and recall. No aphasia or apraxia or dysarthria Cranial Nerves: II: Visual fields grossly normal,  III,IV, VI: ptosis not present, extra-ocular motions intact bilaterally, pupils equal, round, reactive to light and accommodation V,VII: smile symmetric, facial light touch sensation normal bilaterally VIII: hearing normal bilaterally IX,X: uvula rises symmetrically XI: bilateral shoulder shrug XII: midline tongue extension Motor: Right : Upper extremity   4/5    Left:     Upper extremity   5/5  Lower extremity   5/5     Lower extremity   5/5 Tone and bulk:normal tone throughout; no atrophy noted Sensory: Pinprick and light touch intact throughout, bilaterally Deep Tendon Reflexes: did not assess  Plantars: Right: downgoing   Left: downgoing Cerebellar: normal finger-to-nose, normal rapid alternating movements and normal heel-to-shin test Gait: normal gait and station   IMAGING: I have personally reviewed the radiological  images below and agree with the radiology interpretations. Ct Angio Neck W Or Wo Contrast Result Date: 09/09/2017 IMPRESSION: 1. Patent cervical carotid and vertebral arteries without stenosis or dissection. Nonstenotic plaque in the left ICA. 2.  Aortic Atherosclerosis (ICD10-I70.0). 3. Prior right thyroidectomy. 4.5 cm left thyroid mass. Recommend correlation with presumed prior outside thyroid imaging. Electronically Signed   By: Zenia Resides  Jeralyn Ruths M.D.   On: 09/17/2017 18:18   MRI HEAD WITHOUT AND WITH CONTRAST MRA HEAD WITHOUT CONTRAST 09/27/2017 15:26 IMPRESSION: 1. 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 2. Subcentimeter focus of chronic hemorrhage in the right parieto-occipital white matter compatible with a cavernoma. 3. Mild chronic small vessel ischemic disease and mild-to-moderate cerebral atrophy. 4. Patent circle of Willis without proximal branch occlusion or flow limiting proximal stenosis.  Echocardiogram:                                        Study Conclusions  - Procedure narrative: Transthoracic echocardiography. Image   quality was adequate. The study was technically difficult, as a   result of poor patient compliance. - Left ventricle: The cavity size was normal. Wall thickness was   normal. Systolic function was vigorous. The estimated ejection   fraction was in the range of 65% to 70%. Wall motion was normal;   there were no regional wall motion abnormalities. The study is   not technically sufficient to allow evaluation of LV diastolic   function. - Aortic valve: Poorly visualized. Transvalvular velocity was   minimally increased. There was no regurgitation. - Mitral valve: Calcified annulus. Borderline stenosis. Trivial MR. - Left atrium: The atrium was normal in size. Impressions:- Very technically difficult study. Cannot evaluate for   endocarditis. LVEF 65-70%, normal wall  thickness, normal wall   motion, grade 1 DD, indeterminate LV filling pressure, aortic   valve calcification with minimally increased valve gradient, MAC   with borderline mitral stenosis and trivial MR, norma LA size.  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   EEG:                                                                    Impression: This predominantly drowsy and asleep EEG is abnormal due to mild diffuse background slowing.      IMPRESSION: Joe Shah is a 77 y.o. 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  MRI reveals:  Scattered small acute infarcts throughout both cerebral and cerebellar hemispheres suspicious for watershed ischemia.  Suspected Etiology: likely cardioembolic source, +enterococcus bacteremia with vegetation on Prosthetic cardiac valve Resultant Symptoms: confusion and right sided weakness Stroke Risk Factors: diabetes mellitus, hyperlipidemia and hypertension Other Stroke Risk Factors: Advanced age, Aortic stenosis/valve, Hx AFIB, Hx Bladder Cancer, Hx stroke  Outstanding Stroke Work-up Studies:    Work up  Completed at this time  PLAN  09/21/2017: Continue Aspirin/ Statin for now Frequent neuro checks Telemetry monitoring PT/OT/SLP Consult PM & Rehab Consult Case Management /MSW Ongoing aggressive stroke risk factor management Patient counseled to be compliant with his antithrombotic medications Patient counseled on Lifestyle modifications including, Diet, Exercise, and Stress Follow up with [facility] in 6 weeks  HX OF STROKES: Subcentimeter  focus of chronic hemorrhage in the right parieto-occipital white matter   Hx of Paroxysmal AFIB: Will need AC therapy prior to discharge   SEIZURES: EEG- No seizure activity, + slowing. Event likely syncopal. No seizure activity reported overnight Continue Keppra for now Recommend repeat EEG in a few months Maintain Seizure precautions Follow up with outpatient Neurology  HYPERTENSION: Stable, Avoid Hypotension and Dehydration Permissive hypertension (OK if <220/120) for 24-48 hours post stroke and then gradually normalized within 5-7 days. SLong term BP goal normotensive. May slowly restart home B/P medications after 48 hours Home Meds: Cozaar  HYPERLIPIDEMIA:    Component Value Date/Time   CHOL 108 09/04/2017 0417   TRIG 114 09/19/2017 0417   TRIG 113 06/21/2013 1038   HDL 18 (L) 09/23/2017 0417   HDL 56 06/21/2013 1038   CHOLHDL 6.0 09/13/2017 0417   VLDL 23 09/24/2017 0417   LDLCALC 67 09/16/2017 0417   LDLCALC 116 (H) 06/21/2013 1038  Home Meds:  Zocor 20 mg LDL  goal < 70 Continued on  Zocor 20 mg daily Continue statin at discharge  DIABETES: Lab Results  Component Value Date   HGBA1C 7.4 (H) 09/27/2017   No results for input(s): GLUCAP in the last 168 hours. HgbA1c goal < 7.0 Currently on: Novolog Continue CBG monitoring and SSI to maintain glucose 140-180 mg/dl DM education   Other Active Problems: Principal Problem:   Cerebrovascular accident (CVA) ([place]) Active Problems:   Essential hypertension   Aortic valve Replacement   Paroxysmal atrial fibrillation ([facility])   CAD (coronary artery disease)   Spinal stenosis, lumbar   Elevated troponin   New onset seizure ([place])   Bacteremia due to Enterococcus   Syncope   Prosthetic valve endocarditis ([place])   Septic embolism [facility])    Hospital day # 3 VTE prophylaxis: Lovenox  Diet : Fall precautions Seizure precautions Seizure precautions Diet regular Room service appropriate? Yes; Fluid consistency:  Thin   FAMILY UPDATES: family at bedside  TEAM UPDATES: Rai, Vernelle Emerald, MD   Prior Home Stroke Medications:  aspirin 81 mg daily  Discharge Stroke Meds:  Please discharge patient on TBD  Disposition: 01-Home or Self Care Therapy Recs:               PENDING Follow Up:  Follow-up Information    Garvin Fila, MD. Schedule an appointment as soon as possible for a visit in 6 week(s).   Specialties:  Neurology, Radiology Contact information: [address] [place] [address] [phone]          Octavio Graves, DO -PCP Follow up in 1-2 weeks       I have personally examined this patient, reviewed notes, independently viewed imaging studies, participated in medical decision making and plan of care.ROS completed by me personally and pertinent positives fully documented  I have made any additions or clarifications directly to the above note.   He has presented with altered mental status and episode of   loss of consciousness and confusion possible seizure. MRI shows multiple embolic infarcts with positive blood cultures strong suspicion for bacterial endocarditis given his prosthetic heart valve. Ttransesophageal echocardiogram confirms vegetations on his prosthetic aortic valve. He will need prolonged antibiotics and likely valve replacement when medically stable.. Long discussion with the patient and family at the bedside and answered questions. Discussed with Dr. Tana Coast.Continue antibiotics for . Greater than 50% time during this 25 minute visit was spent on counseling and coordination of care for his embolic  strokes, endocarditis and answering questions Stroke team will sign off. Kindly call for questions. Antony Contras, MD Medical Director [facility] Pager: [phone] 09/21/2017 4:13 PM  To contact Stroke Continuity provider, please refer to [email or web address] After hours, contact General Neurology

## 2017-09-21 NOTE — Progress Notes (Signed)
[facility]                                                                              Patient Demographics  Joe Shah, is a 77 y.o. male, DOB - 03/25/1941, ZGY:[id number]  Admit date - 09/19/2017   Admitting Physician No admitting provider for patient encounter.  Outpatient Primary MD for the patient is Octavio Graves, DO  Outpatient specialists:   LOS - 3  days   Medical records reviewed and are as summarized below:    Chief Complaint  Patient presents with  . Loss of Consciousness       Brief summary   Admit note by Dr. Nehemiah Settle on 2/16:   Joe Shah is a 77 y.o. male with a history of htn, hlp, AS s/p porcine valve replacement in 2013 by Dr. 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  Patient was discharged to home on Medrol Dosepak, which he completed on 2/13 and supposed to follow with Dr. Annette Stable neurosurgery in 2 weeks.   On the morning of admission 09/16/2017, his son was helping him get dressed, with the patient fell back on the bed became extremely rigid, lasted for ~ 45-90 seconds, followed by 5 minutes of confusion and disorientation.  Son reported no generalized shaking or rhythmic movements.  There is no prior history of seizures, head injury, stroke. There was a concern whether the patient has been having chills over the past couple of days.  Assessment & Plan    Principal Problem: Acute embolic stroke - Presented with confusion, right arm weakness following the syncopal versus seizure episode - MRI brain showed multiple bilateral small infarcts in the cerebellar and cerebellum region, predominantly in the watershed territory, could be an cardio embolic given paroxysmal atrial fibrillation,  enterococcus bacteremia - MRA showed patent circle of Willis without proximal brought  occlusion of flow limiting proximal stenosis -Continue Lipitor 80 mg daily, LDL 67.  Continue aspirin 325 mg daily, stroke team following -Hemoglobin A1c 7.4 -2D echo showed EF of 59-16%, grade 1 diastolic dysfunction.  -TEE showed extensive vegetation on each leaflet of bioprosthetic aortic valve. -Currently on Keppra, EEG showed mild diffuse background slowing, no epileptiform discharges   Enterococcus bacteremia with enterococcus UTI, endocarditis - Blood cultures positive for enterococcus,, urine culture positive for enterococcus - patient has porcine aortic valve replacement in 2013  -TEE showed extensive vegetation involving each leaflet of bioprosthetic aortic valve with severely elevated mean gradient 56 mmHg.  Opening of the valve impaired by the vegetation.   - per ID started on gentamicin and ampicillin. TCTS planning aortic valve replacement after bacteremia clears.   Left shoulder pain, left wrist pain -X-ray showed arthritis, left wrist very tender to touch -Uric acid 3.4, follow ESR, CRP, x-ray of the left wrist - Per daughter,had IV infiltrated in the left forearm, will obtain Doppler ultrasound of the left arm -If above negative, may need to CT/MRI of the left shoulder, wrist to rule out any septic joint  Mildly elevated  troponin - No complaints of chest pain, troponins 2 flat 0.05, likely due to acute stroke and enterococcus bacteremia -2D echo showed EF of 65-70%, no regional wall motion abnormalities  Seizure - Per neurology, likely syncope however could have a seizure in the setting of embolic infarcts -Neurology recommended no AEDs at this time, EEG showed mild diffuse background slowing, no epileptiform discharges.     Essential hypertension -BP currently soft, stable hold antihypertensives     Aortic valve Replacement with porcine valve -TEE today to rule out endocarditis    Paroxysmal atrial fibrillation (HCC) - Currently rate controlled, patient was not  on any anticoagulation due to paroxysmal atrial fibrillation after the aortic valve surgery, no further recurrence -Follow neurology recommendations regarding anticoagulation    CAD (coronary artery disease) - Currently no chest pain, continue aspirin, statin    Spinal stenosis, lumbar status post recent hospitalization 2/2-2/6 - Denies any significant worsening pain, completed Medrol Dosepak   Code Status: DO NOT RESUSCITATE DVT Prophylaxis:  Lovenox  Family Communication: Discussed in detail with the patient, all imaging results, lab results explained to the patient, daughter and wife at the bedside   Disposition Plan: Will likely need skilled nursing facility once medically ready  Time Spent in minutes 35 minutes  Procedures:  MRI brain TEE  Consultants:   Neurology   infectious disease Cardiology for TEE Cardiothoracic surgery  Antimicrobials:   Ampicillin 2/17  Gentamicin 2/18>   Medications  Scheduled Meds: .  stroke: mapping our early stages of recovery book   Does not apply Once  . aspirin  300 mg Rectal Daily   Or  . aspirin  325 mg Oral Daily  . enoxaparin (LOVENOX) injection  40 mg Subcutaneous Q24H  . simvastatin  20 mg Oral QPM  . tamsulosin  0.4 mg Oral Daily   Continuous Infusions: . sodium chloride 75 mL/hr (09/14/2017 2000)  . ampicillin (OMNIPEN) IV Stopped (09/21/17 1239)  . gentamicin Stopped (09/21/17 1135)  . levETIRAcetam Stopped (09/21/17 0535)   PRN Meds:.acetaminophen, gabapentin, senna-docusate   Antibiotics   Anti-infectives (From admission, onward)   Start     Dose/Rate Route Frequency Ordered Stop   09/19/2017 2000  gentamicin (GARAMYCIN) 110 mg in dextrose 5 % 50 mL IVPB     110 mg 105.5 mL/hr over 30 Minutes Intravenous Every 12 hours 09/09/2017 1842     09/03/2017 1515  DAPTOmycin (CUBICIN) 750 mg in sodium chloride 0.9 % IVPB  Status:  Discontinued     750 mg 230 mL/hr over 30 Minutes Intravenous Every 24 hours 09/30/2017 1511  09/11/2017 1812   09/21/2017 1200  ampicillin (OMNIPEN) 2 g in sodium chloride 0.9 % 100 mL IVPB     2 g 300 mL/hr over 20 Minutes Intravenous Every 4 hours 09/21/2017 1014     09/19/17 1200  DAPTOmycin (CUBICIN) 600 mg in sodium chloride 0.9 % IVPB  Status:  Discontinued     600 mg 224 mL/hr over 30 Minutes Intravenous Every 24 hours 09/19/17 1116 09/13/2017 1818   09/19/17 0900  ampicillin (OMNIPEN) 2 g in sodium chloride 0.9 % 100 mL IVPB  Status:  Discontinued     2 g 300 mL/hr over 20 Minutes Intravenous Every 6 hours 09/19/17 0819 09/21/2017 1014        Subjective:   Joe Shah was seen and examined today.  Very frail and deconditioned, complaining of left shoulder pain, left wrist pain, tender to touch.  Overnight no seizures.  Temp 100.1  F today.  Patient denies dizziness, chest pain, shortness of breath, abdominal pain, N/V/D/C.  Objective:   Vitals:   09/21/17 0043 09/21/17 0456 09/21/17 0800 09/21/17 1200  BP: 125/70 (!) 117/57 112/60 (!) 118/57  Pulse: 98 (!) 49 (!) 51 (!) 48  Resp: _0 Temp: 98.9 F (37.2 C) 99.4 F (37.4 C) 100.1 F (37.8 C) 98.2 F (36.8 C)  TempSrc: Oral Oral Oral Oral  SpO2: 97% 99% 99% 100%  Weight:      Height:        Intake/Output Summary (Last 24 hours) at 09/21/2017 1537 Last data filed at 09/21/2017 9244 Gross per 24 hour  Intake 2732.75 ml  Output 800 ml  Net 1932.75 ml     Wt Readings from Last 3 Encounters:  09/11/2017 75.3 kg (166 lb 0.1 oz)  09/04/17 81.6 kg (180 lb)  09/02/17 81.6 kg (180 lb)     Exam   General: Alert and oriented x 3, very frail and deconditioned  Eyes:   HEENT:   normocephalic, normal oropharynx  Cardiovascular: S1 S2 auscultated, 2/6 murmur in RUSB, RRR No pedal edema b/l  Respiratory: Clear to auscultation bilaterally, no wheezing, rales or rhonchi  Gastrointestinal: Soft, nontender, nondistended, + bowel sounds  Ext: no pedal edema bilaterally, left hand/wrist tender to  touch  Neuro: no new deficits  Musculoskeletal: No digital cyanosis, clubbing  Skin: No rashes  Psych: Normal affect and demeanor, alert and oriented x3    Data Reviewed:  I have personally reviewed following labs and imaging studies  Micro Results Recent Results (from the past 240 hour(s))  Blood culture (routine x 2)     Status: Abnormal   Collection Time: 09/10/2017 11:40 AM  Result Value Ref Range Status   Specimen Description   Final    BLOOD LEFT ANTECUBITAL BOTTLES DRAWN AEROBIC AND ANAEROBIC Performed at [facility], [address] [place], [place] [address]    Special Requests   Final    Blood Culture adequate volume Performed at [facility], [address] [place], [place] [address]    Culture  Setup Time   Final    GRAM POSITIVE COCCI Gram Stain Report Called to,Read Back By and Verified With: LURZ,G @ 1657 ON 09/19/17 BY JUW GS DONE @ [facility] GRAM STAIN REVIEWED-AGREE WITH RESULT 0623 09/19/17 M.[facility]AMPBELL CRITICAL RESULT CALLED TO, READ BACK BY AND VERIFIED WITH: G. ABBOTT, RPHARMD AT 0810 ON 09/19/17 BY C. JESSUP, MLT. Performed at [facility], [place] [address] [place], [place] [address]    Culture ENTEROCOCCUS FAECALIS (A)  Final   Report Status 09/21/2017 FINAL  Final   Organism ID, Bacteria ENTEROCOCCUS FAECALIS  Final      Susceptibility   Enterococcus faecalis - MIC*    AMPICILLIN <=2 SENSITIVE Sensitive     VANCOMYCIN 2 SENSITIVE Sensitive     GENTAMICIN SYNERGY SENSITIVE Sensitive     * ENTEROCOCCUS FAECALIS  Blood Culture ID Panel (Reflexed)     Status: Abnormal   Collection Time: 09/15/2017 11:40 AM  Result Value Ref Range Status   Enterococcus species DETECTED (A) NOT DETECTED Final    Comment: CRITICAL RESULT CALLED TO, READ BACK BY AND VERIFIED WITH: G. ABBOTT, RPHARMD AT 0810 ON 09/19/17 BY C. JESSUP, MLT.    Vancomycin resistance NOT DETECTED NOT DETECTED Final   Listeria monocytogenes NOT DETECTED NOT DETECTED Final   Staphylococcus species  NOT DETECTED NOT DETECTED Final   Staphylococcus aureus NOT DETECTED  NOT DETECTED Final   Streptococcus species NOT DETECTED NOT DETECTED Final   Streptococcus agalactiae NOT DETECTED NOT DETECTED Final   Streptococcus pneumoniae NOT DETECTED NOT DETECTED Final   Streptococcus pyogenes NOT DETECTED NOT DETECTED Final   Acinetobacter baumannii NOT DETECTED NOT DETECTED Final   Enterobacteriaceae species NOT DETECTED NOT DETECTED Final   Enterobacter cloacae complex NOT DETECTED NOT DETECTED Final   Escherichia coli NOT DETECTED NOT DETECTED Final   Klebsiella oxytoca NOT DETECTED NOT DETECTED Final   Klebsiella pneumoniae NOT DETECTED NOT DETECTED Final   Proteus species NOT DETECTED NOT DETECTED Final   Serratia marcescens NOT DETECTED NOT DETECTED Final   Haemophilus influenzae NOT DETECTED NOT DETECTED Final   Neisseria meningitidis NOT DETECTED NOT DETECTED Final   Pseudomonas aeruginosa NOT DETECTED NOT DETECTED Final   Candida albicans NOT DETECTED NOT DETECTED Final   Candida glabrata NOT DETECTED NOT DETECTED Final   Candida krusei NOT DETECTED NOT DETECTED Final   Candida parapsilosis NOT DETECTED NOT DETECTED Final   Candida tropicalis NOT DETECTED NOT DETECTED Final    Comment: Performed at [facility], [place] [address], [place], [place] [address]  Blood culture (routine x 2)     Status: Abnormal   Collection Time: 09/27/2017 11:49 AM  Result Value Ref Range Status   Specimen Description   Final    BLOOD LEFT HAND BOTTLES DRAWN AEROBIC AND ANAEROBIC Performed at [facility], [address] [place], [place] [address]    Special Requests   Final    Blood Culture results may not be optimal due to an inadequate volume of blood received in culture bottles Performed at [facility], [address] [place], [place] [address]    Culture  Setup Time   Final    GRAM POSITIVE COCCI Gram Stain Report Called to,Read Back By and Verified With: LURZ,G @ 5400 ON 09/19/17 BY JUW GS  DONE @ [facility] GRAM STAIN [place] WITH RESULT 0619 09/19/17 M.CAMPBELL IN BOTH AEROBIC AND ANAEROBIC BOTTLES    Culture (A)  Final    ENTEROCOCCUS FAECALIS SUSCEPTIBILITIES PERFORMED ON PREVIOUS CULTURE WITHIN THE LAST 5 DAYS. Performed at [facility], [place] [address] [place], [place] [address]    Report Status 09/21/2017 FINAL  Final  Urine Culture     Status: Abnormal   Collection Time: 09/13/2017 12:45 PM  Result Value Ref Range Status   Specimen Description   Final    URINE, CATHETERIZED Performed at [facility], [address], [place], [place] [address]    Special Requests   Final    NONE Performed at [facility], [address], [place], [place] [address]    Culture 70,000 COLONIES/mL ENTEROCOCCUS FAECALIS (A)  Final   Report Status 09/21/2017 FINAL  Final   Organism ID, Bacteria ENTEROCOCCUS FAECALIS (A)  Final      Susceptibility   Enterococcus faecalis - MIC*    AMPICILLIN <=2 SENSITIVE Sensitive     LEVOFLOXACIN 1 SENSITIVE Sensitive     NITROFURANTOIN <=16 SENSITIVE Sensitive     VANCOMYCIN 2 SENSITIVE Sensitive     * 70,000 COLONIES/mL ENTEROCOCCUS FAECALIS  Culture, blood (routine x 2)     Status: None (Preliminary result)   Collection Time: 09/07/2017 11:59 AM  Result Value Ref Range Status   Specimen Description BLOOD LEFT ANTECUBITAL  Final   Special Requests   Final    BOTTLES DRAWN AEROBIC AND ANAEROBIC Blood Culture results may not be optimal due to  an excessive volume of blood received in culture bottles   Culture   Final    NO GROWTH 1 DAY Performed at [facility], [place] [address], [place], [place] [address]    Report Status PENDING  Incomplete  Culture, blood (routine x 2)     Status: None (Preliminary result)   Collection Time: 09/14/2017 12:10 PM  Result Value Ref Range Status   Specimen Description BLOOD LEFT HAND  Final   Special Requests   Final    BOTTLES DRAWN AEROBIC AND ANAEROBIC Blood Culture adequate volume   Culture   Final     NO GROWTH 1 DAY Performed at [facility], [place] [address] [place], [place] [address]    Report Status PENDING  Incomplete    Radiology Reports Dg Chest 2 View  Result Date: 09/04/2017 CLINICAL DATA:  Cough, fatigue EXAM: CHEST  2 VIEW COMPARISON:  09/10/2011 FINDINGS: There are low lung volumes. There is no focal parenchymal opacity. There is no pleural effusion or pneumothorax. The heart and mediastinal contours are unremarkable. 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 IMPRESSION: No active cardiopulmonary disease. Electronically Signed   By: Kathreen Devoid   On: 09/04/2017 17:03   Dg Lumbar Spine Complete  Result Date: 09/02/2017 CLINICAL DATA:  Fall this morning with bilateral leg weakness and low back pain. EXAM: LUMBAR SPINE - COMPLETE 4+ VIEW COMPARISON:  KUB 01/28/2016 and lumbar spine 11/29/2009 FINDINGS: Moderate curvature of the lower thoracic/lumbar spine convex right without significant change. Moderate spondylosis throughout the lumbar spine. Moderate disc disease at all levels of the lumbar spine without significant change. Grade 2 anterolisthesis of L3 on L4 with interval worsening. This is likely due to the severe facet arthropathy over the mid to lower lumbar spine. No definite compression fracture. Moderate calcified plaque over the abdominal aorta. IMPRESSION: Moderate spondylosis of the lumbar spine with moderate multilevel disc disease throughout the lumbar spine. Interval progression of grade 2 anterolisthesis of L3 on L4 likely due to severe facet arthropathy. Electronically Signed   By: Marin Olp M.D.   On: 09/02/2017 21:06   Ct Head Wo Contrast  Result Date: 09/07/2017 CLINICAL DATA:  Altered level of consciousness. Fall 2 weeks ago. Near syncope today. EXAM: CT HEAD WITHOUT CONTRAST TECHNIQUE: Contiguous axial images were obtained from the base of the skull through the vertex without intravenous contrast.  COMPARISON:  None. FINDINGS: Brain: Mild atrophy. Negative for hydrocephalus. Negative for acute infarct. Mild chronic white matter changes. Subtle hyper density in the right parietal periventricular white matter measuring approximately 7 mm. This does not appear to represent acute hemorrhage but could be subacute hemorrhage or iron deposition from cavernoma which is favored. Hyperdense tumor is also a possibility. Vascular: Atherosclerotic calcification. Negative for acute vascular thrombosis. Skull: Negative Sinuses/Orbits: Negative Other: None IMPRESSION: Mild atrophy and chronic microvascular ischemia in the white matter. No acute infarct. 7 mm subtle hyperdensity in the right parietal periventricular white matter, favor chronic cavernoma. Subacute hemorrhage and hyperdense tumor are possible. Therefore, MRI brain without with contrast recommended for further evaluation. Electronically Signed   By: Franchot Gallo M.D.   On: 09/17/2017 12:02   Ct Angio Neck W Or Wo Contrast  Result Date: 09/29/2017 CLINICAL DATA:  Syncopal/near syncopal event versus seizure. Scattered bilateral cerebral and cerebellar infarcts on MRI. EXAM: CT ANGIOGRAPHY NECK TECHNIQUE: Multidetector CT imaging of the neck was performed using the standard protocol during bolus  administration of intravenous contrast. Multiplanar CT image reconstructions and MIPs were obtained to evaluate the vascular anatomy. Carotid stenosis measurements (when applicable) are obtained utilizing NASCET criteria, using the distal internal carotid diameter as the denominator. CONTRAST:  65 mL Isovue 370 COMPARISON:  None. FINDINGS: Aortic arch: Standard 3 vessel aortic arch with mild atherosclerotic plaque. Widely patent brachiocephalic and subclavian arteries with scattered non stenotic plaque. Right carotid system: Patent without evidence of stenosis or dissection. Tortuous mid to distal cervical ICA. Left carotid system: Patent without evidence of  significant stenosis or dissection. Mild-to-moderate predominantly calcified plaque in the proximal ICA. Tortuous distal cervical ICA. Vertebral arteries: Patent without evidence of significant stenosis or dissection. Mildly dominant left vertebral artery. Nonstenotic calcified plaque at the right vertebral artery origin. Skeleton: Severe right glenohumeral arthropathy. Severe diffuse cervical disc and right-sided facet degeneration. Other neck: Prior right thyroidectomy. Enlarged and heterogeneous left thyroid lobe with an underlying 4.5 cm mass which laterally deviates the common carotid artery and internal jugular vein and slightly deviates the trachea. Upper chest: Mild centrilobular and paraseptal emphysema. IMPRESSION: 1. Patent cervical carotid and vertebral arteries without stenosis or dissection. Nonstenotic plaque in the left ICA. 2.  Aortic Atherosclerosis (ICD10-I70.0). 3. Prior right thyroidectomy. 4.5 cm left thyroid mass. Recommend correlation with presumed prior outside thyroid imaging. Electronically Signed   By: Logan Bores M.D.   On: 09/21/2017 18:18   Ct Lumbar Spine Wo Contrast  Result Date: 09/28/2017 CLINICAL DATA:  Back pain.  Fall 2 weeks ago. EXAM: CT LUMBAR SPINE WITHOUT CONTRAST TECHNIQUE: Multidetector CT imaging of the lumbar spine was performed without intravenous contrast administration. Multiplanar CT image reconstructions were also generated. COMPARISON:  CT lumbar 09/04/2017, MRI lumbar 09/04/2017 FINDINGS: Segmentation: Normal Alignment: Moderate lumbar scoliosis unchanged. 5 mm anterolisthesis L3-4 unchanged. Vertebrae: Negative for fracture or mass Paraspinal and other soft tissues: Atherosclerotic calcification aorta and iliacs. No retroperitoneal mass. Disc levels: T12-L1: Moderate disc and facet degeneration without significant stenosis L1-2: Moderate disc and facet degeneration. Left foraminal narrowing due to spurring. L2-3: Advanced disc degeneration and spurring on  the left causing left foraminal encroachment unchanged. Bilateral facet hypertrophy. Mild narrowing of the canal. Mild right foraminal narrowing. L3-4: 5 mm anterolisthesis with severe facet hypertrophy. Remote laminectomy. Severe facet hypertrophy bilaterally contributing to moderate to severe spinal stenosis. Severe subarticular stenosis bilaterally unchanged. L4-5: Disc degeneration and spurring right greater than left. Severe facet degeneration. Remote laminectomy. Marked bony overgrowth of the facet joints contributing to moderate spinal stenosis. Marked subarticular stenosis bilaterally. L5-S1: Disc degeneration and spurring. Severe facet degeneration. Asymmetric soft tissue in the right foramen suspicious for right foraminal disc protrusion. Correlate with right L5 radicular pain. Severe foraminal encroachment bilaterally due to spurring and disc. IMPRESSION: Advanced multilevel disc and facet degeneration throughout the lumbar spine with moderate scoliosis. Negative for fracture No significant change from prior studies. Electronically Signed   By: Franchot Gallo M.D.   On: 09/29/2017 11:58   Ct Lumbar Spine Wo Contrast  Result Date: 09/04/2017 CLINICAL DATA:  Witnessed fall Friday.  Not ambulatory. EXAM: CT LUMBAR SPINE WITHOUT CONTRAST TECHNIQUE: Multidetector CT imaging of the lumbar spine was performed without intravenous contrast administration. Multiplanar CT image reconstructions were also generated. COMPARISON:  Plain films 09/02/2017. FINDINGS: Segmentation: Standard. Alignment: Degenerative scoliosis convex RIGHT thoracolumbar junction. Mild anterolisthesis L3 on L4, 3 mm. Vertebrae: No acute fracture or focal pathologic process. Paraspinal and other soft tissues: Negative. Aortic atherosclerosis. Marked bladder distention. There appears to be marked prostate  enlargement. Disc levels: L1-L2: Advanced disc space narrowing with osseous spurring. Vacuum phenomenon. LEFT subarticular zone and  foraminal zone narrowing. L2-L3: Moderate disc space narrowing. Osseous spurring. Facet arthropathy. Mild stenosis. No definite subarticular zone narrowing. Significant foraminal narrowing on the LEFT. L3-L4: Moderate disc space narrowing. 3 mm anterolisthesis. Calcified protrusion in the midline. Marked posterior element hypertrophy, primarily facet overgrowth. Severe stenosis. RIGHT greater than LEFT L4 nerve root impingement due to subarticular zone. The RIGHT L3 nerve root may be compressed in the foramen. L4-L5: Severe disc space narrowing with vacuum phenomenon. Osseous spurring. Advanced posterior element hypertrophy. Mild to moderate stenosis. RIGHT sided foraminal zone narrowing could affect the L4 nerve root. L5-S1: Moderate to advanced disc space narrowing. Facet arthropathy. No stenosis or subarticular zone narrowing, but LEFT-sided foraminal narrowing could affect the L5 nerve root. IMPRESSION: Multilevel spondylosis, with varying degrees of stenosis, subarticular zone narrowing and foraminal zone narrowing. 3 mm anterolisthesis L3-4 appears facet mediated. There is no significant slip to suggest ligamentous injury. No visible lower thoracic or lumbar spine compression fracture is evident. Aortic Atherosclerosis (ICD10-I70.0). Marked bladder distention, query bladder outlet obstruction Electronically Signed   By: Staci Righter M.D.   On: 09/04/2017 16:54   Mr Jodene Nam Head Wo Contrast  Result Date: 09/04/2017 CLINICAL DATA:  Near syncopal event today. Fall 2 weeks ago. Orthostatic in emergency department. EXAM: MRI HEAD WITHOUT AND WITH CONTRAST MRA HEAD WITHOUT CONTRAST TECHNIQUE: Multiplanar, multiecho pulse sequences of the brain and surrounding structures were obtained without and with intravenous contrast. Angiographic images of the head were obtained using MRA technique without contrast. CONTRAST:  62m MULTIHANCE GADOBENATE DIMEGLUMINE 529 MG/ML IV SOLN COMPARISON:  Head CT 09/16/2017 FINDINGS: MRI  HEAD FINDINGS Multiple sequences are mildly to moderately motion degraded. Brain: There are numerous subcentimeter foci of acute infarction scattered throughout both cerebral hemispheres as well as left and possibly right cerebellar hemispheres. The there is cortical involvement in the frontal, parietal, and occipital lobes bilaterally, and there is also involvement of the left centrum semiovale. There is a small chronic infarct in the right cerebellum. Scattered T2 hyperintensities in the cerebral white matter and pons are nonspecific but compatible with mild chronic small vessel ischemic disease. There is mild-to-moderate cerebral atrophy. A 5 mm T2 hyperintense focus with prominent susceptibility artifact in the right parieto-occipital white matter corresponds to the subtle hyperattenuation on CT and is without associated enhancement or edema. Chronic microhemorrhages are noted in the right cerebellum and right parietal lobe. Vascular: Major intracranial vascular flow voids are preserved. Skull and upper cervical spine: Unremarkable bone marrow signal. Sinuses/Orbits: Unremarkable orbits. Paranasal sinuses and mastoid air cells are clear. Other: None. MRA HEAD FINDINGS There is intermittent moderate motion artifact. The visualized distal vertebral arteries are patent to the basilar with the left being mildly dominant. Patent left PICA and bilateral SCA origins are identified. The basilar artery is widely patent and tortuous. Posterior communicating arteries are not identified. PCAs are patent without evidence of significant proximal stenosis. The internal carotid arteries are patent from skull base to carotid termini without evidence of significant stenosis. ACAs and MCAs are patent without evidence of proximal branch occlusion, A1 stenosis, or M1 stenosis. There is artifactual signal loss in bilateral M2 greater than A2 branches which limits assessment. No aneurysm is identified. IMPRESSION: 1. 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 2. Subcentimeter focus of chronic hemorrhage in the right parieto-occipital white matter compatible with a cavernoma. 3.  Mild chronic small vessel ischemic disease and mild-to-moderate cerebral atrophy. 4. Patent circle of Willis without proximal branch occlusion or flow limiting proximal stenosis. Electronically Signed   By: Logan Bores M.D.   On: 09/23/2017 15:26   Mr Brain W And Wo Contrast  Result Date: 09/25/2017 CLINICAL DATA:  Near syncopal event today. Fall 2 weeks ago. Orthostatic in emergency department. EXAM: MRI HEAD WITHOUT AND WITH CONTRAST MRA HEAD WITHOUT CONTRAST TECHNIQUE: Multiplanar, multiecho pulse sequences of the brain and surrounding structures were obtained without and with intravenous contrast. Angiographic images of the head were obtained using MRA technique without contrast. CONTRAST:  74m MULTIHANCE GADOBENATE DIMEGLUMINE 529 MG/ML IV SOLN COMPARISON:  Head CT 09/14/2017 FINDINGS: MRI HEAD FINDINGS Multiple sequences are mildly to moderately motion degraded. Brain: There are numerous subcentimeter foci of acute infarction scattered throughout both cerebral hemispheres as well as left and possibly right cerebellar hemispheres. The there is cortical involvement in the frontal, parietal, and occipital lobes bilaterally, and there is also involvement of the left centrum semiovale. There is a small chronic infarct in the right cerebellum. Scattered T2 hyperintensities in the cerebral white matter and pons are nonspecific but compatible with mild chronic small vessel ischemic disease. There is mild-to-moderate cerebral atrophy. A 5 mm T2 hyperintense focus with prominent susceptibility artifact in the right parieto-occipital white matter corresponds to the subtle hyperattenuation on CT and is without associated enhancement or edema. Chronic microhemorrhages are noted in the right  cerebellum and right parietal lobe. Vascular: Major intracranial vascular flow voids are preserved. Skull and upper cervical spine: Unremarkable bone marrow signal. Sinuses/Orbits: Unremarkable orbits. Paranasal sinuses and mastoid air cells are clear. Other: None. MRA HEAD FINDINGS There is intermittent moderate motion artifact. The visualized distal vertebral arteries are patent to the basilar with the left being mildly dominant. Patent left PICA and bilateral SCA origins are identified. The basilar artery is widely patent and tortuous. Posterior communicating arteries are not identified. PCAs are patent without evidence of significant proximal stenosis. The internal carotid arteries are patent from skull base to carotid termini without evidence of significant stenosis. ACAs and MCAs are patent without evidence of proximal branch occlusion, A1 stenosis, or M1 stenosis. There is artifactual signal loss in bilateral M2 greater than A2 branches which limits assessment. No aneurysm is identified. IMPRESSION: 1. 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 2. Subcentimeter focus of chronic hemorrhage in the right parieto-occipital white matter compatible with a cavernoma. 3. Mild chronic small vessel ischemic disease and mild-to-moderate cerebral atrophy. 4. Patent circle of Willis without proximal branch occlusion or flow limiting proximal stenosis. Electronically Signed   By: ALogan BoresM.D.   On: 09/04/2017 15:26   Mr Thoracic Spine Wo Contrast  Result Date: 09/04/2017 CLINICAL DATA:  Patient fell 09/02/2017. Severe back pain. Difficult to ambulate. EXAM: MRI THORACIC AND LUMBAR SPINE WITHOUT CONTRAST TECHNIQUE: Multiplanar and multiecho pulse sequences of the thoracic and lumbar spine were obtained without intravenous contrast. COMPARISON:  Plain films 09/02/2017. CT lumbar spine earlier today. FINDINGS: MRI THORACIC SPINE FINDINGS  Alignment:  Physiologic. Vertebrae: No acute compression fracture or worrisome osseous lesion. Cord:  No significant cord compression or abnormal cord signal. Paraspinal and other soft tissues: Large goiter, substernal nodule extending on the LEFT. Median sternotomy. Gallstone. RIGHT pleural effusion. RIGHT hepatic cyst, approximate 1 x 2 cm. Disc levels: At T7-8 there is a LEFT paracentral protrusion. This does not result in stenosis or cord flattening.  At T8-9 there is a central protrusion associated with posterior element hypertrophy. The T8-9 disc is mildly hyperintense, but there are no signs of infection. There is a RIGHT-sided facet joint effusion associated with asymmetric bony overgrowth. At T10-11, there is a shallow central protrusion with borderline stenosis. At T11-12, there is asymmetric facet arthropathy on the LEFT. MRI LUMBAR SPINE FINDINGS Segmentation:  Standard. Alignment: 3 mm anterolisthesis L3-4 is facet mediated there is degenerative scoliosis convex RIGHT at the thoracolumbar junction, approximately 30 degrees. Vertebrae: No compression fracture or worrisome osseous lesion. Endplate reactive changes. Conus medullaris and cauda equina: Conus extends to the L1-L2 level. The conus appears normal. The cauda equina is compressed and displaced at multiple levels, which appears to be due to a combination of spondylosis and scoliosis. There is some areas, such as opposite L4 (series 6, image 22) where the CSF has an appearance mimicking a subdural effusion or hygroma, there are no visible signs of intraspinal hematoma. Paraspinal and other soft tissues: The bladder is no longer distended. There is no retroperitoneal adenopathy. Disc levels: L1-L2: Disc space narrowing. Shallow central protrusion. Asymmetric facet arthropathy on the LEFT. Foraminal narrowing could affect the LEFT L1 nerve root. L2-L3: Disc space narrowing. Annular bulge with osseous spurring. Posterior element hypertrophy. Moderate  stenosis. BILATERAL L2 and L3 nerve root impingement are possible. L3-L4: Disc space narrowing. 3 mm anterolisthesis. Central protrusion. Posterior element hypertrophy. Severe stenosis. BILATERAL L4 nerve root impingement in the canal. RIGHT L3 nerve root impingement in the foramen. Interspinous bursa edema, consistent with Baastrup's disease. L4-L5: Disc space narrowing osseous spurring. Annular bulge. Posterior element hypertrophy. Moderate stenosis. RIGHT greater than LEFT L5 nerve root impingement. L5-S1: Disc hyperintensity without signs of infection. Central protrusion. Posterior element hypertrophy. Mild stenosis. BILATERAL subarticular zone and foraminal zone narrowing affecting the L5 and S1 nerve roots. IMPRESSION: MR THORACIC SPINE IMPRESSION No thoracic spine compression fracture or visible hematoma. Multilevel spondylosis, worst at T8-9. mild stenosis at this level without thoracic cord compression. MR LUMBAR SPINE IMPRESSION No lumbar spine compression fracture or visible hematoma. Mild anterolisthesis at L3-4 is degenerative, not posttraumatic. Severe spinal stenosis at L3-4 relates to slip, posterior element hypertrophy, and central protrusion, affecting the L3 and L4 nerve roots. See discussion above. Moderate stenosis at L4-5 and L5-S1, related to disc space narrowing, bony overgrowth, disc material, and posterior element hypertrophy. Electronically Signed   By: Staci Righter M.D.   On: 09/04/2017 19:53   Mr Lumbar Spine Wo Contrast  Result Date: 09/04/2017 CLINICAL DATA:  Patient fell 09/02/2017. Severe back pain. Difficult to ambulate. EXAM: MRI THORACIC AND LUMBAR SPINE WITHOUT CONTRAST TECHNIQUE: Multiplanar and multiecho pulse sequences of the thoracic and lumbar spine were obtained without intravenous contrast. COMPARISON:  Plain films 09/02/2017. CT lumbar spine earlier today. FINDINGS: MRI THORACIC SPINE FINDINGS Alignment:  Physiologic. Vertebrae: No acute compression fracture or  worrisome osseous lesion. Cord:  No significant cord compression or abnormal cord signal. Paraspinal and other soft tissues: Large goiter, substernal nodule extending on the LEFT. Median sternotomy. Gallstone. RIGHT pleural effusion. RIGHT hepatic cyst, approximate 1 x 2 cm. Disc levels: At T7-8 there is a LEFT paracentral protrusion. This does not result in stenosis or cord flattening. At T8-9 there is a central protrusion associated with posterior element hypertrophy. The T8-9 disc is mildly hyperintense, but there are no signs of infection. There is a RIGHT-sided facet joint effusion associated with asymmetric bony overgrowth. At T10-11, there is a shallow central protrusion with borderline stenosis.  At T11-12, there is asymmetric facet arthropathy on the LEFT. MRI LUMBAR SPINE FINDINGS Segmentation:  Standard. Alignment: 3 mm anterolisthesis L3-4 is facet mediated there is degenerative scoliosis convex RIGHT at the thoracolumbar junction, approximately 30 degrees. Vertebrae: No compression fracture or worrisome osseous lesion. Endplate reactive changes. Conus medullaris and cauda equina: Conus extends to the L1-L2 level. The conus appears normal. The cauda equina is compressed and displaced at multiple levels, which appears to be due to a combination of spondylosis and scoliosis. There is some areas, such as opposite L4 (series 6, image 22) where the CSF has an appearance mimicking a subdural effusion or hygroma, there are no visible signs of intraspinal hematoma. Paraspinal and other soft tissues: The bladder is no longer distended. There is no retroperitoneal adenopathy. Disc levels: L1-L2: Disc space narrowing. Shallow central protrusion. Asymmetric facet arthropathy on the LEFT. Foraminal narrowing could affect the LEFT L1 nerve root. L2-L3: Disc space narrowing. Annular bulge with osseous spurring. Posterior element hypertrophy. Moderate stenosis. BILATERAL L2 and L3 nerve root impingement are possible.  L3-L4: Disc space narrowing. 3 mm anterolisthesis. Central protrusion. Posterior element hypertrophy. Severe stenosis. BILATERAL L4 nerve root impingement in the canal. RIGHT L3 nerve root impingement in the foramen. Interspinous bursa edema, consistent with Baastrup's disease. L4-L5: Disc space narrowing osseous spurring. Annular bulge. Posterior element hypertrophy. Moderate stenosis. RIGHT greater than LEFT L5 nerve root impingement. L5-S1: Disc hyperintensity without signs of infection. Central protrusion. Posterior element hypertrophy. Mild stenosis. BILATERAL subarticular zone and foraminal zone narrowing affecting the L5 and S1 nerve roots. IMPRESSION: MR THORACIC SPINE IMPRESSION No thoracic spine compression fracture or visible hematoma. Multilevel spondylosis, worst at T8-9. mild stenosis at this level without thoracic cord compression. MR LUMBAR SPINE IMPRESSION No lumbar spine compression fracture or visible hematoma. Mild anterolisthesis at L3-4 is degenerative, not posttraumatic. Severe spinal stenosis at L3-4 relates to slip, posterior element hypertrophy, and central protrusion, affecting the L3 and L4 nerve roots. See discussion above. Moderate stenosis at L4-5 and L5-S1, related to disc space narrowing, bony overgrowth, disc material, and posterior element hypertrophy. Electronically Signed   By: Staci Righter M.D.   On: 09/04/2017 19:53   Dg Chest Port 1 View  Result Date: 09/25/2017 CLINICAL DATA:  Tachypnea. EXAM: PORTABLE CHEST 1 VIEW COMPARISON:  09/14/2017 and 09/04/2017 radiographs. FINDINGS: 1510 hr. Lower lung volumes. The heart size and mediastinal contours are stable status post median sternotomy and aortic valve replacement. There is aortic atherosclerosis. There is new patchy left lower lobe airspace disease. There are possible small bilateral pleural effusions. No edema or pneumothorax. Advanced glenohumeral arthropathic changes are present bilaterally. IMPRESSION: New patchy left  lower lobe opacity may reflect atelectasis or early pneumonia. Possible small bilateral pleural effusions. Electronically Signed   By: Richardean Sale M.D.   On: 09/30/2017 15:35   Dg Chest Port 1 View  Result Date: 09/25/2017 CLINICAL DATA:  Fever and leukocytosis. EXAM: PORTABLE CHEST 1 VIEW COMPARISON:  09/04/2017 FINDINGS: 1234 hours. The lungs are clear without focal pneumonia, edema, pneumothorax or pleural effusion. The cardiopericardial silhouette is within normal limits for size. Degenerative changes noted in the shoulders bilaterally status post median sternotomy. Telemetry leads overlie the chest. IMPRESSION: Low lung volumes without acute findings. Electronically Signed   By: Misty Stanley M.D.   On: 09/24/2017 12:50   Dg Shoulder Left Port  Result Date: 09/29/2017 CLINICAL DATA:  New onset left shoulder pain. Fall a couple days ago. EXAM: LEFT SHOULDER - 1 VIEW COMPARISON:  Report from radiograph 06/05/2011. Chest radiograph 09/04/2017. FINDINGS: No acute fracture or dislocation. Severe osteoarthritis of the glenohumeral joint with joint space loss, osteophytes and fragmentation, subchondral sclerosis and cystic change. The acromioclavicular joint is congruent. IMPRESSION: 1. No acute fracture or subluxation. 2. Advanced osteoarthritis of the glenohumeral joint. Electronically Signed   By: Jeb Levering M.D.   On: 09/26/2017 23:46    Lab Data:  CBC: Recent Labs  Lab 09/05/2017 1102 09/19/17 1103  WBC 19.9* 23.3*  NEUTROABS 17.2*  --   HGB 14.4 11.9*  HCT 43.8 36.0*  MCV 81.0 81.4  PLT 160 379   Basic Metabolic Panel: Recent Labs  Lab 09/17/2017 1102 09/19/17 1103 09/21/17 0217  NA 136 136 138  K 4.4 4.0 3.4*  CL 97* 104 106  CO2 26 21* 20*  GLUCOSE 189* 185* 117*  BUN 62* 38* 22*  CREATININE 1.55* 1.17 1.01  CALCIUM 9.7 8.6* 8.2*   GFR: Estimated Creatinine Clearance: 66.3 mL/min (by C-G formula based on SCr of 1.01 mg/dL). Liver Function Tests: Recent Labs    Lab 09/13/2017 1102  AST 84*  ALT 151*  ALKPHOS 152*  BILITOT 1.0  PROT 6.5  ALBUMIN 2.5*   No results for input(s): LIPASE, AMYLASE in the last 168 hours. No results for input(s): AMMONIA in the last 168 hours. Coagulation Profile: No results for input(s): INR, PROTIME in the last 168 hours. Cardiac Enzymes: Recent Labs  Lab 09/08/2017 1102 09/04/2017 1648 09/26/2017 0417 09/26/2017 2049 09/21/17 0217 09/21/17 0829  CKTOTAL  --   --  <5*  --   --   --   TROPONINI 0.05* 0.05*  --  <0.03 <0.03 <0.03   BNP (last 3 results) No results for input(s): PROBNP in the last 8760 hours. HbA1C: Recent Labs    09/25/2017 0417  HGBA1C 7.4*   CBG: No results for input(s): GLUCAP in the last 168 hours. Lipid Profile: Recent Labs    09/30/2017 0417  CHOL 108  HDL 18*  LDLCALC 67  TRIG 114  CHOLHDL 6.0   Thyroid Function Tests: No results for input(s): TSH, T4TOTAL, FREET4, T3FREE, THYROIDAB in the last 72 hours. Anemia Panel: No results for input(s): VITAMINB12, FOLATE, FERRITIN, TIBC, IRON, RETICCTPCT in the last 72 hours. Urine analysis:    Component Value Date/Time   COLORURINE YELLOW 09/03/2017 [place] 09/17/2017 1245   LABSPEC 1.018 09/17/2017 1245   PHURINE 5.0 09/05/2017 1245   GLUCOSEU 50 (A) 09/04/2017 1245   HGBUR SMALL (A) 09/17/2017 1245   BILIRUBINUR NEGATIVE 09/26/2017 1245   KETONESUR NEGATIVE 09/17/2017 1245   PROTEINUR NEGATIVE 09/13/2017 1245   UROBILINOGEN 1.0 08/14/2011 0959   NITRITE NEGATIVE 09/29/2017 1245   LEUKOCYTESUR NEGATIVE 09/21/2017 1245     Flint Hakeem M.D. [facility] 09/21/2017, 3:37 PM  Pager: [phone] Between 7am to 7pm - call Pager - [phone]  After 7pm go to [email or web address] - password [email or web address]  Call night coverage person covering after 7pm

## 2017-09-21 NOTE — Progress Notes (Signed)
Physical Therapy Treatment Patient Details Name: Joe Shah MRN: [id number] DOB: Jan 17, 1941 Today's Date: 09/21/2017    History of Present Illness Pt is a 77 y.o. male with a history of hypertension, hyperlipidemia, aortic stenosis s/p porcine replacement in 2013 by Dr. 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 He presented from home after falling back on his bed, becoming rigit, and having an increase in confusion. MRI revealing scattered small acute infarcts throughout cerebral and cerebellar hemispheres with concern for watershed vs cardioembolic infarcts. 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     PT Comments    Pt is painful and very lethargic today, however willing to participate in LE exercises to maintain ROM and strength. D/c plan remains appropriate. PT will continue to follow acutely.    Follow Up Recommendations  SNF     Equipment Recommendations  None recommended by PT    Recommendations for Other Services       Precautions / Restrictions Precautions Precautions: Fall;Back Precaution Comments: educated for back precautions with family Restrictions Weight Bearing Restrictions: No          Cognition Arousal/Alertness: Lethargic Behavior During Therapy: WFL for tasks assessed/performed Overall Cognitive Status: Impaired/Different from baseline Area of Impairment: Following commands;Safety/judgement;Attention;Orientation;Memory;Awareness;Problem solving                 Orientation Level: Disoriented to;Place;Time;Situation Current Attention Level: Sustained Memory: Decreased short-term memory;Decreased recall of precautions Following Commands: Follows one step commands  consistently;Follows multi-step commands inconsistently Safety/Judgement: Decreased awareness of safety Awareness: Emergent Problem Solving: Slow processing;Decreased initiation;Difficulty sequencing;Requires verbal cues;Requires tactile cues        Exercises General Exercises - Lower Extremity Ankle Circles/Pumps: AROM;Both;10 reps;Supine Heel Slides: AROM;Both;10 reps;Supine Hip ABduction/ADduction: AROM;Both;10 reps;Supine Straight Leg Raises: AROM;Both;10 reps;Supine        Pertinent Vitals/Pain Pain Assessment: Faces Faces Pain Scale: Hurts whole lot Pain Location: back, neck L UE Pain Descriptors / Indicators: Grimacing Pain Intervention(s): Limited activity within patient's tolerance;Monitored during session;Repositioned           PT Goals (current goals can now be found in the care plan section) Acute Rehab PT Goals Patient Stated Goal: to feel better PT Goal Formulation: With patient/family Time For Goal Achievement: 10/03/17 Potential to Achieve Goals: Fair Progress towards PT goals: Not progressing toward goals - comment    Frequency    Min 4X/week      PT Plan Current plan remains appropriate    M-PAC PT "6 Clicks" Daily Activity  Outcome Measure  Difficulty turning over in bed (including adjusting bedclothes, sheets and blankets)?: A Lot Difficulty moving from lying on back to sitting on the side of the bed? : Unable Difficulty sitting down on and standing up from a chair with arms (e.g., wheelchair, bedside commode, etc,.)?: Unable Help needed moving to and from a bed to chair (including a wheelchair)?: Total Help needed walking in hospital room?: Total Help needed climbing 3-5 steps with a railing? : Total 6 Click Score: 7    End of Session   Activity Tolerance: Patient limited by pain Patient left: in chair;with call bell/phone within reach;with family/visitor present;with chair alarm set Nurse Communication: Mobility status PT Visit  Diagnosis: Unsteadiness on feet (R26.81);Other abnormalities of gait and mobility (R26.89);Difficulty in walking, not elsewhere classified (R26.2);Other symptoms and  signs involving the nervous system (R29.898);Pain Pain - part of body: (back, neck, and L UE)     Time: 5396-7289 PT Time Calculation (min) (ACUTE ONLY): 13 min  Charges:  $Therapeutic Exercise: 8-22 mins                    G Codes:       Wynn Alldredge B. Migdalia Dk PT, DPT Acute Rehabilitation  [phone] Pager [phone]     [place] 09/21/2017, 2:43 PM

## 2017-09-21 NOTE — Consult Note (Signed)
Reason for Consult:Aortic valve endocarditis Referring Physician: Triad  Joe Shah is an 77 y.o. male.  HPI: 77 yo man with a history of AVR by EBG in 2013 who presented with a syncopal episode.  Joe Shah is a 77 yo man with a past history of paroxsymal atrial fibrillation, hypertension, hyperlipidemia, bladder cancer, aortic stenosis, AvR in 2013, scoliosis, head injury, stroke, spinal stenosis with cauda equina syndrome, and arthritis.  He was admitted 09/03/2017.  On the morning of admission he was getting dressed when he fell back on the bed and became rigid followed by about 5 minutes of confusion and disorientation.  Was unclear initially if this might be a seizure.  He was brought to the emergency room where a CT of the head showed no acute stroke.  MR showed scattered small acute infarcts throughout the cerebral and cerebellar hemispheres in a watershed distribution.  His troponin was slightly elevated at 0.05 and his white blood cell count was elevated at 19,000.  He has been feeling poorly for a couple of weeks prior to admission.  He was admitted to the hospital in mid January with a viral illness presumed respiratory.  He also was readmitted in early February with a 2-week history of myalgias and fatigue.  He was having marked lumbar pain and was having difficulty voiding.  Patient has begun to complain of left shoulder and left arm pain.  Since admission blood cultures grew enterococcus.  He was started on ampicillin and daptomycin.  He had a transesophageal echocardiogram yesterday which showed vegetations on the prosthetic aortic valve.  There was no aortic insufficiency, but there was a gradient across the valve.  He had a fever to 103.1 last night.  Currently is on ampicillin and gentamicin.  Past Medical History:  Diagnosis Date  . ADD (attention deficit disorder with hyperactivity)   . Aortic stenosis   . Aortic valve disorders   . Arthritis   . Bruises easily   . Cancer  [facility])    bladder cancer  . Head injury    at age 65 from [place]  . Heart murmur   . HOH (hard of hearing)   . Hyperlipidemia    takes Zocor daily  . Ischemic stroke ([place]) 09/29/2017  . Microscopic hematuria    managed by medical MD Dr.Cynthia Melina Copa in [place]  . Other and unspecified hyperlipidemia   . Other B-complex deficiencies   . Scoliosis   . Shortness of breath    with exertion   . Unspecified essential hypertension    takes Accupril daily  . Urinary frequency     Past Surgical History:  Procedure Laterality Date  . AORTIC VALVE REPLACEMENT  08/18/2011   Procedure: AORTIC VALVE REPLACEMENT (AVR);  Surgeon: Grace Isaac, MD;  Location: [facility];  Service: Open Heart Surgery;  Laterality: N/A;  On pump.  Marland Kitchen BACK SURGERY  2009  . CARDIAC CATHETERIZATION  2008   moderate AS. Normal coronary arteries except for mild plaque in LAD  . COLONOSCOPY    . HEMORRHOID SURGERY  30+yrs ago  . KNEE ARTHROSCOPY  15+yrs ago;2012   x 2 left  . THYROIDECTOMY, PARTIAL  20+yrs ago  . TONSILLECTOMY     as a child    Family History  Problem Relation Age of Onset  . Heart failure Mother        died 40  . Brain cancer Father        died 63  . Anesthesia problems  Neg Hx   . Hypotension Neg Hx   . Malignant hyperthermia Neg Hx   . Pseudochol deficiency Neg Hx     Social History:  reports that he quit smoking about 16 years ago. His smoking use included cigarettes. He started smoking about 60 years ago. He has a 17.50 pack-year smoking history. He quit smokeless tobacco use about 6 years ago. His smokeless tobacco use included snuff. He reports that he drinks about 0.6 oz of alcohol per week. He reports that he does not use drugs.  Allergies: No Known Allergies  Medications:  Scheduled: .  stroke: mapping our early stages of recovery book   Does not apply Once  . aspirin  300 mg Rectal Daily   Or  . aspirin  325 mg Oral Daily  . enoxaparin (LOVENOX) injection  40 mg Subcutaneous  Q24H  . simvastatin  20 mg Oral QPM  . tamsulosin  0.4 mg Oral Daily    Results for orders placed or performed during the hospital encounter of 09/14/2017 (from the past 48 hour(s))  Basic metabolic panel     Status: Abnormal   Collection Time: 09/19/17 11:03 AM  Result Value Ref Range   Sodium 136 135 - 145 mmol/L   Potassium 4.0 3.5 - 5.1 mmol/L   Chloride 104 101 - 111 mmol/L   CO2 21 (L) 22 - 32 mmol/L   Glucose, Bld 185 (H) 65 - 99 mg/dL   BUN 38 (H) 6 - 20 mg/dL   Creatinine, Ser 1.17 0.61 - 1.24 mg/dL   Calcium 8.6 (L) 8.9 - 10.3 mg/dL   GFR calc non Af Amer 59 (L) >60 mL/min   GFR calc Af Amer >60 >60 mL/min    Comment: (NOTE) The eGFR has been calculated using the CKD EPI equation. This calculation has not been validated in all clinical situations. eGFR's persistently <60 mL/min signify possible Chronic Kidney Disease.    Anion gap 11 5 - 15    Comment: Performed at [facility] [address] [place], [place] [address]  CBC     Status: Abnormal   Collection Time: 09/19/17 11:03 AM  Result Value Ref Range   WBC 23.3 (H) 4.0 - 10.5 K/uL   RBC 4.42 4.22 - 5.81 MIL/uL   Hemoglobin 11.9 (L) 13.0 - 17.0 g/dL   HCT 36.0 (L) 39.0 - 52.0 %   MCV 81.4 78.0 - 100.0 fL   MCH 26.9 26.0 - 34.0 pg   MCHC 33.1 30.0 - 36.0 g/dL   RDW 14.0 11.5 - 15.5 %   Platelets 180 150 - 400 K/uL    Comment: Performed at [facility], [place] [address], [place], [place] [address]  CK     Status: Abnormal   Collection Time: 09/24/2017  4:17 AM  Result Value Ref Range   Total CK <5 (L) 49 - 397 U/L    Comment: REPEATED TO VERIFY Performed at [facility], [place] [address], [place], [place] [address]   HIV antibody     Status: None   Collection Time: 09/11/2017  4:17 AM  Result Value Ref Range   HIV Screen 4th Generation wRfx Non Reactive Non Reactive    Comment: (NOTE) Performed At: [facility] [place], [place] [id number] Rush Farmer MD  YI:[id number] Performed at [facility], [place] [address], [place], [place] [address]   Hepatitis c antibody (reflex)     Status: None  Collection Time: 09/24/2017  4:17 AM  Result Value Ref Range   HCV Ab 0.1 0.0 - 0.9 s/co ratio    Comment: (NOTE) Performed At: [facility] [place], [place] [id number] Rush Farmer MD [id number] Performed at [facility], [place] [address] [place], [place] [address]   Lipid panel     Status: Abnormal   Collection Time: 09/29/2017  4:17 AM  Result Value Ref Range   Cholesterol 108 0 - 200 mg/dL   Triglycerides 114 <150 mg/dL   HDL 18 (L) >40 mg/dL   Total CHOL/HDL Ratio 6.0 RATIO   VLDL 23 0 - 40 mg/dL   LDL Cholesterol 67 0 - 99 mg/dL    Comment:        Total Cholesterol/HDL:CHD Risk Coronary Heart Disease Risk Table                     Men   Women  1/2 Average Risk   3.4   3.3  Average Risk       5.0   4.4  2 X Average Risk   9.6   7.1  3 X Average Risk  23.4   11.0        Use the calculated Patient Ratio above and the CHD Risk Table to determine the patient's CHD Risk.        ATP III CLASSIFICATION (LDL):  <100     mg/dL   Optimal  100-129  mg/dL   Near or Above                    Optimal  130-159  mg/dL   Borderline  160-189  mg/dL   High  >190     mg/dL   Very High Performed at [facility] [address] [place], [place] [address]   Hemoglobin A1c     Status: Abnormal   Collection Time: 09/16/2017  4:17 AM  Result Value Ref Range   Hgb A1c MFr Bld 7.4 (H) 4.8 - 5.6 %    Comment: (NOTE) Pre diabetes:          5.7%-6.4% Diabetes:              >6.4% Glycemic control for   <7.0% adults with diabetes    Mean Plasma Glucose 165.68 mg/dL    Comment: Performed at [facility] [address], [place], [place] [address]  HCV Comment:     Status: None   Collection Time: 09/27/2017  4:17 AM  Result Value Ref Range   Comment: Comment     Comment: (NOTE) Non reactive HCV antibody  screen is consistent with no HCV infection, unless recent infection is suspected or other evidence exists to indicate HCV infection. Performed At: [facility] [place], [place] [id number] Rush Farmer MD [id number] Performed at [facility], [place] [address] [place], [place] [address]   Troponin I (q 6hr x 3)     Status: None   Collection Time: 09/24/2017  8:49 PM  Result Value Ref Range   Troponin I <0.03 <0.03 ng/mL    Comment: Performed at [facility] [address] [place],  [address]  Basic metabolic panel     Status: Abnormal   Collection Time: 09/21/17  2:17 AM  Result Value Ref Range   Sodium 138 135 - 145 mmol/L   Potassium 3.4 (L) 3.5 - 5.1 mmol/L  Chloride 106 101 - 111 mmol/L   CO2 20 (L) 22 - 32 mmol/L   Glucose, Bld 117 (H) 65 - 99 mg/dL   BUN 22 (H) 6 - 20 mg/dL   Creatinine, Ser 1.01 0.61 - 1.24 mg/dL   Calcium 8.2 (L) 8.9 - 10.3 mg/dL   GFR calc non Af Amer >60 >60 mL/min   GFR calc Af Amer >60 >60 mL/min    Comment: (NOTE) The eGFR has been calculated using the CKD EPI equation. This calculation has not been validated in all clinical situations. eGFR's persistently <60 mL/min signify possible Chronic Kidney Disease.    Anion gap 12 5 - 15    Comment: Performed at [facility] [address], [place], [place] [address]  Troponin I (q 6hr x 3)     Status: None   Collection Time: 09/21/17  2:17 AM  Result Value Ref Range   Troponin I <0.03 <0.03 ng/mL    Comment: Performed at [facility] [address], [place], [place] [address]    Dg Chest Port 1 View  Result Date: 09/07/2017 CLINICAL DATA:  Tachypnea. EXAM: PORTABLE CHEST 1 VIEW COMPARISON:  09/11/2017 and 09/04/2017 radiographs. FINDINGS: 1510 hr. Lower lung volumes. The heart size and mediastinal contours are stable status post median sternotomy and aortic valve replacement. There is aortic atherosclerosis. There is new patchy left lower  lobe airspace disease. There are possible small bilateral pleural effusions. No edema or pneumothorax. Advanced glenohumeral arthropathic changes are present bilaterally. IMPRESSION: New patchy left lower lobe opacity may reflect atelectasis or early pneumonia. Possible small bilateral pleural effusions. Electronically Signed   By: Richardean Sale M.D.   On: 09/04/2017 15:35   Dg Shoulder Left Port  Result Date: 09/30/2017 CLINICAL DATA:  New onset left shoulder pain. Fall a couple days ago. EXAM: LEFT SHOULDER - 1 VIEW COMPARISON:  Report from radiograph 06/05/2011. Chest radiograph 09/04/2017. FINDINGS: No acute fracture or dislocation. Severe osteoarthritis of the glenohumeral joint with joint space loss, osteophytes and fragmentation, subchondral sclerosis and cystic change. The acromioclavicular joint is congruent. IMPRESSION: 1. No acute fracture or subluxation. 2. Advanced osteoarthritis of the glenohumeral joint. Electronically Signed   By: Jeb Levering M.D.   On: 09/25/2017 23:46    Review of Systems  Constitutional: Positive for fever and malaise/fatigue.  HENT: Positive for hearing loss.   Respiratory: Positive for cough.   Cardiovascular: Positive for chest pain.  Genitourinary: Negative for dysuria.  Musculoskeletal: Positive for joint pain.       Left arm and and pain  Neurological: Positive for loss of consciousness and headaches.   Blood pressure 112/60, pulse (!) 51, temperature 100.1 F (37.8 C), temperature source Oral, resp. rate 18, height 6' (1.829 m), weight 166 lb 0.1 oz (75.3 kg), SpO2 99 %. Physical Exam  Vitals reviewed. Constitutional: He is oriented to person, place, and time. No distress.  Elderly male  HENT:  Head: Normocephalic and atraumatic.  Mouth/Throat: No oropharyngeal exudate.  Hard of hearing  Eyes: Conjunctivae and EOM are normal. No scleral icterus.  Neck: Neck supple. No thyromegaly present.  Cardiovascular: Normal rate and regular rhythm.   Murmur (2/6 low pitched murmur RUSB) heard. Respiratory: Effort normal. No respiratory distress. He has no wheezes. He has no rales.  GI: Soft. He exhibits no distension. There is no tenderness.  Musculoskeletal: He exhibits deformity (kyphoscoliosis). He exhibits no edema.  Left hand tender to palpation, good radial pulse brisk cap refill  Lymphadenopathy:    He has no cervical adenopathy.  Neurological: He is alert and oriented to person, place, and time. No cranial nerve deficit. He exhibits abnormal muscle tone (LUE restricted by pain).  Skin: Skin is warm and dry. He is not diaphoretic.   I reviewed the echocardiographic images.  The official report is not yet available.  He does have vegetations on the aortic valve.  No apparent vegetations on other valves.  Assessment/Plan: 77 year old man admitted following a syncopal spell who was found to have multiple embolic strokes.  He grew enterococcus from his blood cultures and also urine culture.  TEE revealed vegetations on the prosthetic aortic valve.  He is still febrile with a fever of 103.1 last night.  Prosthetic aortic valve endocarditis-enterococcus.  He is on appropriate antibiotic therapy with ampicillin and gentamicin.  In all likelihood he will need redo aortic valve replacement.  Prior to that he needs to clear his blood cultures.  Given his age he will likely also need coronary imaging.  Cardiology might prefer coronary CT over catheterization.  But they will need to be involved.  Back pain-previously attributed to a fall.  Initially concerning for potential septic emboli.  May need repeat imaging.  Left shoulder and arm pain-complained of severe pain when I touched his hand.  The hand itself is well perfused there is brisk capillary refill with a good radial pulse.  Plain x-ray of the left shoulder showed arthritis.  May also need to consider further imaging of that area as well.  Dr. Servando Snare is out of town this week.  He will  be back next week.  We will follow.  Melrose Nakayama 09/21/2017, 9:38 AM

## 2017-09-21 NOTE — Progress Notes (Signed)
Pt c/o chest pain. V.S. Done. Temp elevated, giving tylenol. O2 placed on 2L because O2 stat was at 91%. Paged MD. Troponin level ordered. Chest pain resolved.
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
## 2017-09-21 NOTE — Consult Note (Signed)
Cardiology Consultation:   Patient ID: Joe Shah; [id number]; 1941-03-31   Admit date: 09/11/2017 Date of Consult: 09/21/2017  Primary Care Provider: Octavio Graves, DO Primary Cardiologist: Kate Sable, MD   Patient Profile:   Joe Shah is a 77 y.o. male with a hx of bioprosthetic AVR 2013, PAF post-operatively, HTN, HLD, bladder cancer, aortic stenosis, scoliosis, head injury, stroke, spinal stenosis with cauda equina syndrome, and arthritis who is being seen today for the evaluation of bradycardia and endocarditis at the request of Dr. Tana Coast.  History of Present Illness:   Mr. Joe Shah was admitted on 09/28/2017 with syncope and becoming rigid for about 45-90 seconds while he was getting dressed in the morning. This was followed by about 5 minutes of confusion and disorientation. It was unclear if this was seizure activity. In the ED a CT head was negative for acute infarct. A subsequent MR of the brain showed scattered small acute infarcts throughout both cerebral and cerebellar hemispheres suspicious for watershed ischemia. CTA of the neck showed patent cervical carotid and vertebral arteries without stenosis or dissection, nonstenotic plaque in the left ICA.  Apparently the patient had been feeling unwell for the prior couple of weeks. He was also hospitalized in January for viral illness presumed to be respiratory and again in early February for myalgias and fatigue.   On admission the patient had a mild troponin elevation to 0.05 and has come down to <0.03. His WBCs were found to be 19.9 and increased to 23.3. Blood cultures are positive for enterococcus as well as urine culture. ID is following and the patient is on IV gentamicin and ampicillin. He had a TEE done yesterday which showed extensive vegetation involving each leaflet of the bioprosthetic aortic valve with mean gradient of 56 mmHg. There was no aortic insufficiency. He had fever of 103.1 last night.   Today he  developed bradycardia with complete heart block, rates in the 40's. The patient denies chest pain, shortness of breath, palpitations or lightheadedness. He does appear to be mildly dyspneic on exam. He also has edema of the left arm and the family report that he has had pain there. They say that he had an IV infiltrate. They also report that he has complained of chest pain a few times, but he denies this.   Past Medical History:  Diagnosis Date  . ADD (attention deficit disorder with hyperactivity)   . Aortic stenosis   . Aortic valve disorders   . Arthritis   . Bruises easily   . Cancer [facility])    bladder cancer  . Head injury    at age 12 from [place]  . Heart murmur   . HOH (hard of hearing)   . Hyperlipidemia    takes Zocor daily  . Ischemic stroke ([place]) 09/25/2017  . Microscopic hematuria    managed by medical MD Dr.Cynthia Melina Copa in [place]  . Other and unspecified hyperlipidemia   . Other B-complex deficiencies   . Scoliosis   . Shortness of breath    with exertion   . Unspecified essential hypertension    takes Accupril daily  . Urinary frequency     Past Surgical History:  Procedure Laterality Date  . AORTIC VALVE REPLACEMENT  08/18/2011   Procedure: AORTIC VALVE REPLACEMENT (AVR);  Surgeon: Grace Isaac, MD;  Location: [facility];  Service: Open Heart Surgery;  Laterality: N/A;  On pump.  Marland Kitchen BACK SURGERY  2009  . CARDIAC CATHETERIZATION  2008   moderate  AS. Normal coronary arteries except for mild plaque in LAD  . COLONOSCOPY    . HEMORRHOID SURGERY  30+yrs ago  . KNEE ARTHROSCOPY  15+yrs ago;2012   x 2 left  . THYROIDECTOMY, PARTIAL  20+yrs ago  . TONSILLECTOMY     as a child     Home Medications:  Prior to Admission medications   Medication Sig Start Date End Date Taking? Authorizing Provider  acetaminophen (TYLENOL) 325 MG tablet Take 2 tablets (650 mg total) by mouth every 6 (six) hours as needed. 09/02/17  Yes Varney Biles, MD  aspirin 81 MG tablet Take 81  mg by mouth daily.   Yes [provider]  gabapentin (NEURONTIN) 300 MG capsule Take 300 mg by mouth daily as needed (for pain).  06/25/17  Yes [provider]  losartan (COZAAR) 50 MG tablet Take 1 tablet (50 mg total) by mouth daily. 03/17/17 09/19/2017 Yes Herminio Commons, MD  meloxicam (MOBIC) 15 MG tablet Take 15 mg by mouth as needed for pain.  07/25/13  Yes Chipper Herb, MD  naproxen (NAPROSYN) 375 MG tablet Take 1 tablet (375 mg total) by mouth 2 (two) times daily. 09/02/17  Yes Varney Biles, MD  simvastatin (ZOCOR) 20 MG tablet TAKE 1 TABLET DAILY IN THE EVENING 03/22/17  Yes Herminio Commons, MD  sodium chloride (OCEAN) 0.65 % SOLN nasal spray Place 2 sprays into both nostrils as needed for congestion.   Yes [provider]  tamsulosin (FLOMAX) 0.4 MG CAPS capsule Take 1 capsule by mouth daily. 09/24/15  Yes [provider]  diazepam (VALIUM) 2 MG tablet Take 1 tablet (2 mg total) by mouth every 6 (six) hours as needed for anxiety. 09/10/17   Lacretia Leigh, MD  methylPREDNISolone (MEDROL DOSEPAK) 4 MG TBPK tablet follow package directions 09/08/17   Earnie Larsson, MD  oxyCODONE-acetaminophen (PERCOCET/ROXICET) 5-325 MG tablet Take 1-2 tablets by mouth every 4 (four) hours as needed for severe pain. 09/10/17   Lacretia Leigh, MD    Inpatient Medications: Scheduled Meds: .  stroke: mapping our early stages of recovery book   Does not apply Once  . aspirin  300 mg Rectal Daily   Or  . aspirin  325 mg Oral Daily  . enoxaparin (LOVENOX) injection  40 mg Subcutaneous Q24H  . simvastatin  20 mg Oral QPM  . tamsulosin  0.4 mg Oral Daily   Continuous Infusions: . sodium chloride 75 mL/hr (09/14/2017 2000)  . ampicillin (OMNIPEN) IV 2 g (09/21/17 1557)  . gentamicin Stopped (09/21/17 1135)  . levETIRAcetam Stopped (09/21/17 0535)   PRN Meds: acetaminophen, gabapentin, senna-docusate  Allergies:   No Known Allergies  Social History:   Social  History   Socioeconomic History  . Marital status: Married    Spouse name: [name]  . Number of children: 3  . Years of education: Not on file  . Highest education level: Not on file  Social Needs  . Financial resource strain: Not on file  . Food insecurity - worry: Not on file  . Food insecurity - inability: Not on file  . Transportation needs - medical: Not on file  . Transportation needs - non-medical: Not on file  Occupational History  . Occupation: [employer]    Comment: DRIVER   Tobacco Use  . Smoking status: Former Smoker    Packs/day: 0.50    Years: 35.00    Pack years: 17.50    Types: Cigarettes    Start date:  12/29/1956    Last attempt to quit: 08/03/2001    Years since quitting: 16.1  . Smokeless tobacco: Former Systems developer    Types: Snuff    Quit date: 08/17/2011  . Tobacco comment: quit smoking 10+yrs ago;dips snuff  Substance and Sexual Activity  . Alcohol use: Yes    Alcohol/week: 0.6 oz    Types: 1 Glasses of wine per week    Comment: one glass per night  . Drug use: No  . Sexual activity: Yes  Other Topics Concern  . Not on file  Social History Narrative  . Not on file    Family History:    Family History  Problem Relation Age of Onset  . Heart failure Mother        died 42  . Brain cancer Father        died 66  . Anesthesia problems Neg Hx   . Hypotension Neg Hx   . Malignant hyperthermia Neg Hx   . Pseudochol deficiency Neg Hx      ROS:  Please see the history of present illness.   All other ROS reviewed and negative.     Physical Exam/Data:   Vitals:   09/21/17 0456 09/21/17 0800 09/21/17 1200 09/21/17 1600  BP: (!) 117/57 112/60 (!) 118/57 109/64  Pulse: (!) 49 (!) 51 (!) 48 (!) 49  Resp: 18 18 18 18   Temp: 99.4 F (37.4 C) 100.1 F (37.8 C) 98.2 F (36.8 C) 99.1 F (37.3 C)  TempSrc: Oral Oral Oral Oral  SpO2: 99% 99% 100% 100%  Weight:      Height:        Intake/Output Summary (Last 24 hours) at 09/21/2017 1638 Last data  filed at 09/21/2017 0520 Gross per 24 hour  Intake 2732.75 ml  Output 800 ml  Net 1932.75 ml   Filed Weights   09/21/2017 2052  Weight: 166 lb 0.1 oz (75.3 kg)   Body mass index is 22.51 kg/m.  General:  Acutely ill appearing,  in no acute distress HEENT: normal Neck: no JVD Endocrine:  No thryomegaly Vascular:  FA pulses 2+ bilaterally without bruits. Heart murmur heard in carotids Cardiac:  normal S1, S2; Slow rate, 2/6 murmur at RUSB Lungs:  clear to auscultation bilaterally, no wheezing, rhonchi or rales  Abd: soft, nontender, no hepatomegaly  Ext: no edema Musculoskeletal:  No deformities, BUE and BLE strength normal and equal Skin: warm and dry  Neuro:  CNs 2-12 intact, no focal abnormalities noted Psych:  Normal affect   EKG:  The EKG was personally reviewed and demonstrates:  On 09/09/2017 sinus tachycardia at 110 bpm, non-specific T wave changes in leads II,III,AVF and V4-6 Telemetry:  Telemetry was personally reviewed and demonstrates:  Complete heart block in the 40's  Relevant CV Studies:  TEE as above  Laboratory Data:  Chemistry Recent Labs  Lab 09/25/2017 1102 09/19/17 1103 09/21/17 0217  NA 136 136 138  K 4.4 4.0 3.4*  CL 97* 104 106  CO2 26 21* 20*  GLUCOSE 189* 185* 117*  BUN 62* 38* 22*  CREATININE 1.55* 1.17 1.01  CALCIUM 9.7 8.6* 8.2*  GFRNONAA 42* 59* >60  GFRAA 48* >60 >60  ANIONGAP 13 11 12     Recent Labs  Lab 09/19/2017 1102  PROT 6.5  ALBUMIN 2.5*  AST 84*  ALT 151*  ALKPHOS 152*  BILITOT 1.0   Hematology Recent Labs  Lab 09/09/2017 1102 09/19/17 1103  WBC 19.9* 23.3*  RBC 5.41 4.42  HGB 14.4 11.9*  HCT 43.8 36.0*  MCV 81.0 81.4  MCH 26.6 26.9  MCHC 32.9 33.1  RDW 13.8 14.0  PLT 160 180   Cardiac Enzymes Recent Labs  Lab 09/24/2017 1102 09/29/2017 1648 09/12/2017 2049 09/21/17 0217 09/21/17 0829  TROPONINI 0.05* 0.05* <0.03 <0.03 <0.03   No results for input(s): TROPIPOC in the last 168 hours.  BNPNo results for input(s):  BNP, PROBNP in the last 168 hours.  DDimer No results for input(s): DDIMER in the last 168 hours.  Radiology/Studies:  Ct Head Wo Contrast  Result Date: 09/19/2017 CLINICAL DATA:  Altered level of consciousness. Fall 2 weeks ago. Near syncope today. EXAM: CT HEAD WITHOUT CONTRAST TECHNIQUE: Contiguous axial images were obtained from the base of the skull through the vertex without intravenous contrast. COMPARISON:  None. FINDINGS: Brain: Mild atrophy. Negative for hydrocephalus. Negative for acute infarct. Mild chronic white matter changes. Subtle hyper density in the right parietal periventricular white matter measuring approximately 7 mm. This does not appear to represent acute hemorrhage but could be subacute hemorrhage or iron deposition from cavernoma which is favored. Hyperdense tumor is also a possibility. Vascular: Atherosclerotic calcification. Negative for acute vascular thrombosis. Skull: Negative Sinuses/Orbits: Negative Other: None IMPRESSION: Mild atrophy and chronic microvascular ischemia in the white matter. No acute infarct. 7 mm subtle hyperdensity in the right parietal periventricular white matter, favor chronic cavernoma. Subacute hemorrhage and hyperdense tumor are possible. Therefore, MRI brain without with contrast recommended for further evaluation. Electronically Signed   By: Franchot Gallo M.D.   On: 09/25/2017 12:02   Ct Angio Neck W Or Wo Contrast  Result Date: 09/23/2017 CLINICAL DATA:  Syncopal/near syncopal event versus seizure. Scattered bilateral cerebral and cerebellar infarcts on MRI. EXAM: CT ANGIOGRAPHY NECK TECHNIQUE: Multidetector CT imaging of the neck was performed using the standard protocol during bolus administration of intravenous contrast. Multiplanar CT image reconstructions and MIPs were obtained to evaluate the vascular anatomy. Carotid stenosis measurements (when applicable) are obtained utilizing NASCET criteria, using the distal internal carotid diameter  as the denominator. CONTRAST:  65 mL Isovue 370 COMPARISON:  None. FINDINGS: Aortic arch: Standard 3 vessel aortic arch with mild atherosclerotic plaque. Widely patent brachiocephalic and subclavian arteries with scattered non stenotic plaque. Right carotid system: Patent without evidence of stenosis or dissection. Tortuous mid to distal cervical ICA. Left carotid system: Patent without evidence of significant stenosis or dissection. Mild-to-moderate predominantly calcified plaque in the proximal ICA. Tortuous distal cervical ICA. Vertebral arteries: Patent without evidence of significant stenosis or dissection. Mildly dominant left vertebral artery. Nonstenotic calcified plaque at the right vertebral artery origin. Skeleton: Severe right glenohumeral arthropathy. Severe diffuse cervical disc and right-sided facet degeneration. Other neck: Prior right thyroidectomy. Enlarged and heterogeneous left thyroid lobe with an underlying 4.5 cm mass which laterally deviates the common carotid artery and internal jugular vein and slightly deviates the trachea. Upper chest: Mild centrilobular and paraseptal emphysema. IMPRESSION: 1. Patent cervical carotid and vertebral arteries without stenosis or dissection. Nonstenotic plaque in the left ICA. 2.  Aortic Atherosclerosis (ICD10-I70.0). 3. Prior right thyroidectomy. 4.5 cm left thyroid mass. Recommend correlation with presumed prior outside thyroid imaging. Electronically Signed   By: Logan Bores M.D.   On: 09/21/2017 18:18   Ct Lumbar Spine Wo Contrast  Result Date: 09/21/2017 CLINICAL DATA:  Back pain.  Fall 2 weeks ago. EXAM: CT LUMBAR SPINE WITHOUT CONTRAST TECHNIQUE: Multidetector CT imaging of the lumbar spine was performed without intravenous contrast administration. Multiplanar CT  image reconstructions were also generated. COMPARISON:  CT lumbar 09/04/2017, MRI lumbar 09/04/2017 FINDINGS: Segmentation: Normal Alignment: Moderate lumbar scoliosis unchanged. 5 mm  anterolisthesis L3-4 unchanged. Vertebrae: Negative for fracture or mass Paraspinal and other soft tissues: Atherosclerotic calcification aorta and iliacs. No retroperitoneal mass. Disc levels: T12-L1: Moderate disc and facet degeneration without significant stenosis L1-2: Moderate disc and facet degeneration. Left foraminal narrowing due to spurring. L2-3: Advanced disc degeneration and spurring on the left causing left foraminal encroachment unchanged. Bilateral facet hypertrophy. Mild narrowing of the canal. Mild right foraminal narrowing. L3-4: 5 mm anterolisthesis with severe facet hypertrophy. Remote laminectomy. Severe facet hypertrophy bilaterally contributing to moderate to severe spinal stenosis. Severe subarticular stenosis bilaterally unchanged. L4-5: Disc degeneration and spurring right greater than left. Severe facet degeneration. Remote laminectomy. Marked bony overgrowth of the facet joints contributing to moderate spinal stenosis. Marked subarticular stenosis bilaterally. L5-S1: Disc degeneration and spurring. Severe facet degeneration. Asymmetric soft tissue in the right foramen suspicious for right foraminal disc protrusion. Correlate with right L5 radicular pain. Severe foraminal encroachment bilaterally due to spurring and disc. IMPRESSION: Advanced multilevel disc and facet degeneration throughout the lumbar spine with moderate scoliosis. Negative for fracture No significant change from prior studies. Electronically Signed   By: Franchot Gallo M.D.   On: 09/27/2017 11:58   Mr Jodene Nam Head Wo Contrast  Result Date: 09/11/2017 CLINICAL DATA:  Near syncopal event today. Fall 2 weeks ago. Orthostatic in emergency department. EXAM: MRI HEAD WITHOUT AND WITH CONTRAST MRA HEAD WITHOUT CONTRAST TECHNIQUE: Multiplanar, multiecho pulse sequences of the brain and surrounding structures were obtained without and with intravenous contrast. Angiographic images of the head were obtained using MRA technique  without contrast. CONTRAST:  60mL MULTIHANCE GADOBENATE DIMEGLUMINE 529 MG/ML IV SOLN COMPARISON:  Head CT 09/28/2017 FINDINGS: MRI HEAD FINDINGS Multiple sequences are mildly to moderately motion degraded. Brain: There are numerous subcentimeter foci of acute infarction scattered throughout both cerebral hemispheres as well as left and possibly right cerebellar hemispheres. The there is cortical involvement in the frontal, parietal, and occipital lobes bilaterally, and there is also involvement of the left centrum semiovale. There is a small chronic infarct in the right cerebellum. Scattered T2 hyperintensities in the cerebral white matter and pons are nonspecific but compatible with mild chronic small vessel ischemic disease. There is mild-to-moderate cerebral atrophy. A 5 mm T2 hyperintense focus with prominent susceptibility artifact in the right parieto-occipital white matter corresponds to the subtle hyperattenuation on CT and is without associated enhancement or edema. Chronic microhemorrhages are noted in the right cerebellum and right parietal lobe. Vascular: Major intracranial vascular flow voids are preserved. Skull and upper cervical spine: Unremarkable bone marrow signal. Sinuses/Orbits: Unremarkable orbits. Paranasal sinuses and mastoid air cells are clear. Other: None. MRA HEAD FINDINGS There is intermittent moderate motion artifact. The visualized distal vertebral arteries are patent to the basilar with the left being mildly dominant. Patent left PICA and bilateral SCA origins are identified. The basilar artery is widely patent and tortuous. Posterior communicating arteries are not identified. PCAs are patent without evidence of significant proximal stenosis. The internal carotid arteries are patent from skull base to carotid termini without evidence of significant stenosis. ACAs and MCAs are patent without evidence of proximal branch occlusion, A1 stenosis, or M1 stenosis. There is artifactual signal  loss in bilateral M2 greater than A2 branches which limits assessment. No aneurysm is identified. IMPRESSION: 1. 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 2.  Subcentimeter focus of chronic hemorrhage in the right parieto-occipital white matter compatible with a cavernoma. 3. Mild chronic small vessel ischemic disease and mild-to-moderate cerebral atrophy. 4. Patent circle of Willis without proximal branch occlusion or flow limiting proximal stenosis. Electronically Signed   By: Logan Bores M.D.   On: 09/26/2017 15:26   Mr Brain W And Wo Contrast  Result Date: 09/21/2017 CLINICAL DATA:  Near syncopal event today. Fall 2 weeks ago. Orthostatic in emergency department. EXAM: MRI HEAD WITHOUT AND WITH CONTRAST MRA HEAD WITHOUT CONTRAST TECHNIQUE: Multiplanar, multiecho pulse sequences of the brain and surrounding structures were obtained without and with intravenous contrast. Angiographic images of the head were obtained using MRA technique without contrast. CONTRAST:  98mL MULTIHANCE GADOBENATE DIMEGLUMINE 529 MG/ML IV SOLN COMPARISON:  Head CT 09/13/2017 FINDINGS: MRI HEAD FINDINGS Multiple sequences are mildly to moderately motion degraded. Brain: There are numerous subcentimeter foci of acute infarction scattered throughout both cerebral hemispheres as well as left and possibly right cerebellar hemispheres. The there is cortical involvement in the frontal, parietal, and occipital lobes bilaterally, and there is also involvement of the left centrum semiovale. There is a small chronic infarct in the right cerebellum. Scattered T2 hyperintensities in the cerebral white matter and pons are nonspecific but compatible with mild chronic small vessel ischemic disease. There is mild-to-moderate cerebral atrophy. A 5 mm T2 hyperintense focus with prominent susceptibility artifact in the right parieto-occipital white matter corresponds to the  subtle hyperattenuation on CT and is without associated enhancement or edema. Chronic microhemorrhages are noted in the right cerebellum and right parietal lobe. Vascular: Major intracranial vascular flow voids are preserved. Skull and upper cervical spine: Unremarkable bone marrow signal. Sinuses/Orbits: Unremarkable orbits. Paranasal sinuses and mastoid air cells are clear. Other: None. MRA HEAD FINDINGS There is intermittent moderate motion artifact. The visualized distal vertebral arteries are patent to the basilar with the left being mildly dominant. Patent left PICA and bilateral SCA origins are identified. The basilar artery is widely patent and tortuous. Posterior communicating arteries are not identified. PCAs are patent without evidence of significant proximal stenosis. The internal carotid arteries are patent from skull base to carotid termini without evidence of significant stenosis. ACAs and MCAs are patent without evidence of proximal branch occlusion, A1 stenosis, or M1 stenosis. There is artifactual signal loss in bilateral M2 greater than A2 branches which limits assessment. No aneurysm is identified. IMPRESSION: 1. 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 2. Subcentimeter focus of chronic hemorrhage in the right parieto-occipital white matter compatible with a cavernoma. 3. Mild chronic small vessel ischemic disease and mild-to-moderate cerebral atrophy. 4. Patent circle of Willis without proximal branch occlusion or flow limiting proximal stenosis. Electronically Signed   By: Logan Bores M.D.   On: 09/16/2017 15:26   Dg Chest Port 1 View  Result Date: 09/28/2017 CLINICAL DATA:  Tachypnea. EXAM: PORTABLE CHEST 1 VIEW COMPARISON:  09/14/2017 and 09/04/2017 radiographs. FINDINGS: 1510 hr. Lower lung volumes. The heart size and mediastinal contours are stable status post median sternotomy and aortic valve replacement.  There is aortic atherosclerosis. There is new patchy left lower lobe airspace disease. There are possible small bilateral pleural effusions. No edema or pneumothorax. Advanced glenohumeral arthropathic changes are present bilaterally. IMPRESSION: New patchy left lower lobe opacity may reflect atelectasis or early pneumonia. Possible small bilateral pleural effusions. Electronically Signed   By: Richardean Sale M.D.   On: 09/16/2017 15:35   Dg Chest  Port 1 View  Result Date: 09/24/2017 CLINICAL DATA:  Fever and leukocytosis. EXAM: PORTABLE CHEST 1 VIEW COMPARISON:  09/04/2017 FINDINGS: 1234 hours. The lungs are clear without focal pneumonia, edema, pneumothorax or pleural effusion. The cardiopericardial silhouette is within normal limits for size. Degenerative changes noted in the shoulders bilaterally status post median sternotomy. Telemetry leads overlie the chest. IMPRESSION: Low lung volumes without acute findings. Electronically Signed   By: Misty Stanley M.D.   On: 09/24/2017 12:50   Dg Shoulder Left Port  Result Date: 09/25/2017 CLINICAL DATA:  New onset left shoulder pain. Fall a couple days ago. EXAM: LEFT SHOULDER - 1 VIEW COMPARISON:  Report from radiograph 06/05/2011. Chest radiograph 09/04/2017. FINDINGS: No acute fracture or dislocation. Severe osteoarthritis of the glenohumeral joint with joint space loss, osteophytes and fragmentation, subchondral sclerosis and cystic change. The acromioclavicular joint is congruent. IMPRESSION: 1. No acute fracture or subluxation. 2. Advanced osteoarthritis of the glenohumeral joint. Electronically Signed   By: Jeb Levering M.D.   On: 09/29/2017 23:46    Assessment and Plan:   Complete heart block: Pt was in sinus tach on presentation, today developed complete heart block with rates in the 40's-50's. BP stable.   Stroke: MRI shows multiple embolic infarcts with positive blood cultures, strong suspicion for bacterial endocarditis. Pt on aspirin 325  mg and statin.   Bacterial endocarditis: S/P bioprosthetic AVR 2013.  WBCs 19.9 >> 23.3. Blood cultures positive for enterococcus as well as urine culture. ID is following and the patient is on IV gentamicin and ampicillin.  TEE done yesterday showed extensive vegetation involving each leaflet of the bioprosthetic aortic valve with mean gradient of 56 mmHg. There was no aortic insufficiency. He had fever of 103.1 last night. Seen by TCTS. Pt will likely need redo AVR once blood cultures cleared. Per [facility] pt will need evaluation with either cardiac cath or CCTA.  Hypertension: Avoiding hypotension and dehydration with recent stroke. Has been over 48 hours since stroke and can restart home BP meds slowly per neuro. Currently BP is 100's-120's/50's-70's. No need for treatment at this time. (home meds losartan 50)  Hx of afib: Pt had afib postoperatively after his AVR. No known recurrence since then. Has not been anticoagulated.   Hyperlipidemia: On simvastatin at home. Continuing here.    For questions or updates, please contact [place] Please consult [email or web address] for contact info under Cardiology/STEMI.   Signed, Daune Perch, NP  09/21/2017 4:38 PM   History and all data above reviewed.  Patient examined.  I agree with the findings as above.  Very pleasant man with a complex situation.  He has endocarditis.  I did review the images personally and discussed them with Dr. Aundra Dubin.  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  He is on appropriate antibiotics for enterococcus.  He presented with sinus with first degree and Mobitz I and looks to have intermittent high degree HB on tele with reasonable narrow escape rhythm and sustained BPs (No EKG in this rhythm yet.)  He has some dysarthria but is doing relatively well and has been cleared to eat.  Left hemiparesis.  The patient denies any new symptoms such as chest discomfort, neck or arm discomfort. There has  been no new shortness of breath, PND or orthopnea. There have been no reported palpitations, presyncope or syncope.  He has left arm pain where an IV infiltrated.   The patient exam reveals PPJ:[id number] with ectopy, systolic murmur  at the apex and radiating slightly out the outflow tract  ,  Lungs: Decreased breath sounds  ,  Abd: Positive bowel sounds, no rebound no guarding, Ext no lower extremity edema  .  All available labs, radiology testing, previous records reviewed. Agree with documented assessment and plan. Endocarditis:  Agree that with the size of this vegetation he will need surgery.  However, there are no urgent indications and he would be high risk with the recent stroke.  In addition, he needs to have clearance of the bacteremia.  I discussed all of this with his very nice family.  He will need imaging of his coronaries prior to any surgery but we will discuss this.  Would like to avoid cath and might consider CTA.  At this point I will order an EKG and follow on tele.  No indication for temp pacing at this point.  We did not see any evidence of valve ring involvement which would be unusual with this organism.    Jeneen Rinks Kourtnee Lahey  6:28 PM  09/21/2017

## 2017-09-22 ENCOUNTER — Encounter (HOSPITAL_COMMUNITY): Payer: Medicare Other

## 2017-09-22 MED ORDER — MORPHINE SULFATE (PF) 2 MG/ML IV SOLN
2.0000 mg | Freq: Once | INTRAVENOUS | Status: AC
  Administered 2017-09-22: 2 mg via INTRAVENOUS

## 2017-09-22 MED ORDER — SODIUM CHLORIDE 0.9 % IV BOLUS (SEPSIS): 500.0000 mL | Freq: Once | INTRAVENOUS | Status: DC

## 2017-09-22 MED ORDER — MORPHINE SULFATE (PF) 2 MG/ML IV SOLN
INTRAVENOUS | Status: AC
  Filled 2017-09-22: qty 1

## 2017-09-22 MED ORDER — MORPHINE SULFATE (PF) 2 MG/ML IV SOLN
2.0000 mg | Freq: Once | INTRAVENOUS | Status: AC
  Administered 2017-09-22: 2 mg via INTRAVENOUS
  Filled 2017-09-22: qty 1

## 2017-09-25 LAB — CULTURE, BLOOD (ROUTINE X 2)
CULTURE: NO GROWTH
Culture: NO GROWTH
Special Requests: ADEQUATE

## 2017-10-01 NOTE — Progress Notes (Signed)
Increasingly SOB, rapid respirations 30-40  HR 114.  On call notified,new orders received & initiated.  Patient arousable saying, "I'm so tired", diaphoretic,  BP 87/48.

## 2017-10-01 NOTE — Death Summary Note (Signed)
DEATH SUMMARY   Patient Details  Name: Joe Shah MRN: [id number] DOB: [birth year]  Admission/Discharge Information   Admit Date:  2017-10-08  Date of Death: Date of Death: 10/12/2017  Time of Death: Time of Death: 0527  Length of Stay: 4  Referring Physician: Octavio Graves, DO   Reason(s) for Hospitalization  Acute embolic stroke   Diagnoses  Preliminary cause of death: Bacterial endocarditis Secondary Diagnoses (including complications and co-morbidities):     Acute embolic Cerebrovascular accident (CVA) ([place])   Essential hypertension   Aortic valve Replacement   Paroxysmal atrial fibrillation ([facility])   CAD (coronary artery disease)   Spinal stenosis, lumbar   Elevated troponin   New onset seizure ([place])   Bacteremia due to Enterococcus   Syncope   Prosthetic valve endocarditis ([place])   Septic embolism ([place])    Complete heart block with bradycardia    Brief Hospital Course (including significant findings, care, treatment, and services provided and events leading to death)  Joe Shah was a 77 y.o. year old male with a history of htn, hlp, AS s/p porcine valve replacement in 2011-11-19 by Dr. 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 Patient was discharged to home on Medrol Dosepak, which he completed on 2/13 and supposed to follow with Dr. Annette Stable neurosurgery in 2 weeks.  On the morning of admission October 08, 2017, his son was helping him get dressed, with the patient fell back on the bed became extremely rigid, lasted for ~ 45-90 seconds, followed by 5 minutes of confusion and disorientation. Son reported no generalized shaking or rhythmic movements. There is no prior history of seizures, head injury, stroke. There was a concern whether the patient has been having chills over the past couple of days.   Acute embolic  stroke - Patient presented with confusion, right arm weakness following the syncopal versus seizure episode - MRI brain showed multiple bilateral small infarcts in the cerebellar and cerebellum region, predominantly in the watershed territory, could be an cardio embolic given paroxysmal atrial fibrillation,  enterococcus bacteremia - MRA showed patent circle of Willis without proximal brought occlusion of flow limiting proximal stenosis -patient was placed on Lipitor 80 mg daily, LDL 67 and aspirin 325 mg daily. Neurology/stroke team followed patient closely -Hemoglobin A1c 7.4 -2D echo showed EF of 02-54%, grade 1 diastolic dysfunction.  -Patient underwent TEE showed extensive vegetation on each leaflet of bioprosthetic aortic valve. -Patient was placed on Keppra for the seizure episode, EEG showed mild diffuse background slowing, no epileptiform discharges   Enterococcus bacteremia with enterococcus UTI, prosthetic valve endocarditis, complete heart block with pauses  - Blood cultures came back positive for enterococcus,, urine culture positive for enterococcus - patient had porcine aortic valve replacement in 11-19-11  -TEE showed extensive vegetation involving each leaflet of bioprosthetic aortic valve with severely elevated mean gradient 56 mmHg.  Opening of the valve impaired by the vegetation.   - Patient was started on gentamicin and ampicillin by ID (Dr Graylon Good). - TCTS was consulted and recommended aortic valve replacement after bacteremia clears.  - On 2/19 evening, patient developed bradycardia and sinus pauses upto 3 sec. Cardiology was consulted, seen by Dr Percival Spanish, felt patient would need surgery for extensive vegetations but needed bacteremia to clear but high risk due to recent stroke. No indication of pacing.   On Oct 12, 2022 am, 4:42 am, patient had rapid  response with tachycardia, hypotension and hypoxia. Patient was found to have agonal breathing. Patient was DNR status. He passed on  Oct 22, 2017 at 5:27am.    Left shoulder pain, left wrist pain -X-ray showed arthritis, left wrist very tender to touch -Uric acid 3.4, xray of left shoulder negative, left wrist raised possibility of pseudogout.  - Per daughter,had IV infiltrated in the left forearm,  Doppler ultrasound of the left arm was pending    Mildly elevated troponin - No complaints of chest pain, troponins 2 flat 0.05, likely due to acute stroke and enterococcus bacteremia -2D echo showed EF of 65-70%, no regional wall motion abnormalities  Seizure - Per neurology, likely syncope however could have a seizure in the setting of embolic infarcts -Neurology recommended no AEDs at this time, EEG showed mild diffuse background slowing, no epileptiform discharges.     Essential hypertension -BP was soft hence all antihypertensives held     Aortic valve Replacement with porcine valve -TEE as above as #2    Paroxysmal atrial fibrillation ([facility]) - patient was not on any anticoagulation due to paroxysmal atrial fibrillation after the aortic valve surgery, no further recurrence    CAD (coronary artery disease) - patient was placed on aspirin, statin    Spinal stenosis, lumbar status post recent hospitalization 2/2-2/6 - Denied any significant worsening pain, completed Medrol Dosepak       Pertinent Labs and Studies  Significant Diagnostic Studies Dg Chest 2 View  Result Date: 09/04/2017 CLINICAL DATA:  Cough, fatigue EXAM: CHEST  2 VIEW COMPARISON:  09/10/2011 FINDINGS: There are low lung volumes. There is no focal parenchymal opacity. There is no pleural effusion or pneumothorax. The heart and mediastinal contours are unremarkable. 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 IMPRESSION: No active cardiopulmonary disease. Electronically Signed   By: Kathreen Devoid   On: 09/04/2017 17:03   Dg Lumbar Spine Complete  Result Date: 09/02/2017 CLINICAL DATA:   Fall this morning with bilateral leg weakness and low back pain. EXAM: LUMBAR SPINE - COMPLETE 4+ VIEW COMPARISON:  KUB 01/28/2016 and lumbar spine 11/29/2009 FINDINGS: Moderate curvature of the lower thoracic/lumbar spine convex right without significant change. Moderate spondylosis throughout the lumbar spine. Moderate disc disease at all levels of the lumbar spine without significant change. Grade 2 anterolisthesis of L3 on L4 with interval worsening. This is likely due to the severe facet arthropathy over the mid to lower lumbar spine. No definite compression fracture. Moderate calcified plaque over the abdominal aorta. IMPRESSION: Moderate spondylosis of the lumbar spine with moderate multilevel disc disease throughout the lumbar spine. Interval progression of grade 2 anterolisthesis of L3 on L4 likely due to severe facet arthropathy. Electronically Signed   By: Marin Olp M.D.   On: 09/02/2017 21:06   Dg Wrist Complete Left  Result Date: 09/21/2017 CLINICAL DATA:  Left wrist pain. EXAM: LEFT WRIST - COMPLETE 3+ VIEW COMPARISON:  None. FINDINGS: Soft tissue swelling about the wrist more so dorsally. Advanced degenerative cystic change of the carpal bones with osteoarthritic joint space narrowing of the first CMC and triscaphe articulations. Lesser degree of joint space narrowing of the first MCP and interphalangeal joints of the thumb. There is chondrocalcinosis of the triangular fibrocartilage complex. No acute fracture or suspicious osseous lesions. IMPRESSION: 1. Diffuse joint space narrowing and cystic change of the carpal bones. This in conjunction with chondrocalcinosis of the triangular fibrocartilage complex raise the possibility of calcium pyrophosphate deposition disease (pseudogout).  2. Soft tissue swelling about the hand and wrist. No acute fracture. 3. Osteoarthritis of the triscaphe and first [place] articulations. Lesser degree of osteoarthritic joint space narrowing of the thumb.  Electronically Signed   By: Ashley Royalty M.D.   On: 09/21/2017 17:34   Ct Head Wo Contrast  Result Date: 09/23/2017 CLINICAL DATA:  Altered level of consciousness. Fall 2 weeks ago. Near syncope today. EXAM: CT HEAD WITHOUT CONTRAST TECHNIQUE: Contiguous axial images were obtained from the base of the skull through the vertex without intravenous contrast. COMPARISON:  None. FINDINGS: Brain: Mild atrophy. Negative for hydrocephalus. Negative for acute infarct. Mild chronic white matter changes. Subtle hyper density in the right parietal periventricular white matter measuring approximately 7 mm. This does not appear to represent acute hemorrhage but could be subacute hemorrhage or iron deposition from cavernoma which is favored. Hyperdense tumor is also a possibility. Vascular: Atherosclerotic calcification. Negative for acute vascular thrombosis. Skull: Negative Sinuses/Orbits: Negative Other: None IMPRESSION: Mild atrophy and chronic microvascular ischemia in the white matter. No acute infarct. 7 mm subtle hyperdensity in the right parietal periventricular white matter, favor chronic cavernoma. Subacute hemorrhage and hyperdense tumor are possible. Therefore, MRI brain without with contrast recommended for further evaluation. Electronically Signed   By: Franchot Gallo M.D.   On: 09/10/2017 12:02   Ct Angio Neck W Or Wo Contrast  Result Date: 09/05/2017 CLINICAL DATA:  Syncopal/near syncopal event versus seizure. Scattered bilateral cerebral and cerebellar infarcts on MRI. EXAM: CT ANGIOGRAPHY NECK TECHNIQUE: Multidetector CT imaging of the neck was performed using the standard protocol during bolus administration of intravenous contrast. Multiplanar CT image reconstructions and MIPs were obtained to evaluate the vascular anatomy. Carotid stenosis measurements (when applicable) are obtained utilizing NASCET criteria, using the distal internal carotid diameter as the denominator. CONTRAST:  65 mL Isovue 370  COMPARISON:  None. FINDINGS: Aortic arch: Standard 3 vessel aortic arch with mild atherosclerotic plaque. Widely patent brachiocephalic and subclavian arteries with scattered non stenotic plaque. Right carotid system: Patent without evidence of stenosis or dissection. Tortuous mid to distal cervical ICA. Left carotid system: Patent without evidence of significant stenosis or dissection. Mild-to-moderate predominantly calcified plaque in the proximal ICA. Tortuous distal cervical ICA. Vertebral arteries: Patent without evidence of significant stenosis or dissection. Mildly dominant left vertebral artery. Nonstenotic calcified plaque at the right vertebral artery origin. Skeleton: Severe right glenohumeral arthropathy. Severe diffuse cervical disc and right-sided facet degeneration. Other neck: Prior right thyroidectomy. Enlarged and heterogeneous left thyroid lobe with an underlying 4.5 cm mass which laterally deviates the common carotid artery and internal jugular vein and slightly deviates the trachea. Upper chest: Mild centrilobular and paraseptal emphysema. IMPRESSION: 1. Patent cervical carotid and vertebral arteries without stenosis or dissection. Nonstenotic plaque in the left ICA. 2.  Aortic Atherosclerosis (ICD10-I70.0). 3. Prior right thyroidectomy. 4.5 cm left thyroid mass. Recommend correlation with presumed prior outside thyroid imaging. Electronically Signed   By: Logan Bores M.D.   On: 09/29/2017 18:18   Ct Lumbar Spine Wo Contrast  Result Date: 09/03/2017 CLINICAL DATA:  Back pain.  Fall 2 weeks ago. EXAM: CT LUMBAR SPINE WITHOUT CONTRAST TECHNIQUE: Multidetector CT imaging of the lumbar spine was performed without intravenous contrast administration. Multiplanar CT image reconstructions were also generated. COMPARISON:  CT lumbar 09/04/2017, MRI lumbar 09/04/2017 FINDINGS: Segmentation: Normal Alignment: Moderate lumbar scoliosis unchanged. 5 mm anterolisthesis L3-4 unchanged. Vertebrae:  Negative for fracture or mass Paraspinal and other soft tissues: Atherosclerotic calcification aorta and iliacs. No  retroperitoneal mass. Disc levels: T12-L1: Moderate disc and facet degeneration without significant stenosis L1-2: Moderate disc and facet degeneration. Left foraminal narrowing due to spurring. L2-3: Advanced disc degeneration and spurring on the left causing left foraminal encroachment unchanged. Bilateral facet hypertrophy. Mild narrowing of the canal. Mild right foraminal narrowing. L3-4: 5 mm anterolisthesis with severe facet hypertrophy. Remote laminectomy. Severe facet hypertrophy bilaterally contributing to moderate to severe spinal stenosis. Severe subarticular stenosis bilaterally unchanged. L4-5: Disc degeneration and spurring right greater than left. Severe facet degeneration. Remote laminectomy. Marked bony overgrowth of the facet joints contributing to moderate spinal stenosis. Marked subarticular stenosis bilaterally. L5-S1: Disc degeneration and spurring. Severe facet degeneration. Asymmetric soft tissue in the right foramen suspicious for right foraminal disc protrusion. Correlate with right L5 radicular pain. Severe foraminal encroachment bilaterally due to spurring and disc. IMPRESSION: Advanced multilevel disc and facet degeneration throughout the lumbar spine with moderate scoliosis. Negative for fracture No significant change from prior studies. Electronically Signed   By: Franchot Gallo M.D.   On: 09/29/2017 11:58   Ct Lumbar Spine Wo Contrast  Result Date: 09/04/2017 CLINICAL DATA:  Witnessed fall Friday.  Not ambulatory. EXAM: CT LUMBAR SPINE WITHOUT CONTRAST TECHNIQUE: Multidetector CT imaging of the lumbar spine was performed without intravenous contrast administration. Multiplanar CT image reconstructions were also generated. COMPARISON:  Plain films 09/02/2017. FINDINGS: Segmentation: Standard. Alignment: Degenerative scoliosis convex RIGHT thoracolumbar junction. Mild  anterolisthesis L3 on L4, 3 mm. Vertebrae: No acute fracture or focal pathologic process. Paraspinal and other soft tissues: Negative. Aortic atherosclerosis. Marked bladder distention. There appears to be marked prostate enlargement. Disc levels: L1-L2: Advanced disc space narrowing with osseous spurring. Vacuum phenomenon. LEFT subarticular zone and foraminal zone narrowing. L2-L3: Moderate disc space narrowing. Osseous spurring. Facet arthropathy. Mild stenosis. No definite subarticular zone narrowing. Significant foraminal narrowing on the LEFT. L3-L4: Moderate disc space narrowing. 3 mm anterolisthesis. Calcified protrusion in the midline. Marked posterior element hypertrophy, primarily facet overgrowth. Severe stenosis. RIGHT greater than LEFT L4 nerve root impingement due to subarticular zone. The RIGHT L3 nerve root may be compressed in the foramen. L4-L5: Severe disc space narrowing with vacuum phenomenon. Osseous spurring. Advanced posterior element hypertrophy. Mild to moderate stenosis. RIGHT sided foraminal zone narrowing could affect the L4 nerve root. L5-S1: Moderate to advanced disc space narrowing. Facet arthropathy. No stenosis or subarticular zone narrowing, but LEFT-sided foraminal narrowing could affect the L5 nerve root. IMPRESSION: Multilevel spondylosis, with varying degrees of stenosis, subarticular zone narrowing and foraminal zone narrowing. 3 mm anterolisthesis L3-4 appears facet mediated. There is no significant slip to suggest ligamentous injury. No visible lower thoracic or lumbar spine compression fracture is evident. Aortic Atherosclerosis (ICD10-I70.0). Marked bladder distention, query bladder outlet obstruction Electronically Signed   By: Staci Righter M.D.   On: 09/04/2017 16:54   Mr Jodene Nam Head Wo Contrast  Result Date: 09/11/2017 CLINICAL DATA:  Near syncopal event today. Fall 2 weeks ago. Orthostatic in emergency department. EXAM: MRI HEAD WITHOUT AND WITH CONTRAST MRA HEAD  WITHOUT CONTRAST TECHNIQUE: Multiplanar, multiecho pulse sequences of the brain and surrounding structures were obtained without and with intravenous contrast. Angiographic images of the head were obtained using MRA technique without contrast. CONTRAST:  51mL MULTIHANCE GADOBENATE DIMEGLUMINE 529 MG/ML IV SOLN COMPARISON:  Head CT 09/11/2017 FINDINGS: MRI HEAD FINDINGS Multiple sequences are mildly to moderately motion degraded. Brain: There are numerous subcentimeter foci of acute infarction scattered throughout both cerebral hemispheres as well as left and possibly right cerebellar hemispheres.  The there is cortical involvement in the frontal, parietal, and occipital lobes bilaterally, and there is also involvement of the left centrum semiovale. There is a small chronic infarct in the right cerebellum. Scattered T2 hyperintensities in the cerebral white matter and pons are nonspecific but compatible with mild chronic small vessel ischemic disease. There is mild-to-moderate cerebral atrophy. A 5 mm T2 hyperintense focus with prominent susceptibility artifact in the right parieto-occipital white matter corresponds to the subtle hyperattenuation on CT and is without associated enhancement or edema. Chronic microhemorrhages are noted in the right cerebellum and right parietal lobe. Vascular: Major intracranial vascular flow voids are preserved. Skull and upper cervical spine: Unremarkable bone marrow signal. Sinuses/Orbits: Unremarkable orbits. Paranasal sinuses and mastoid air cells are clear. Other: None. MRA HEAD FINDINGS There is intermittent moderate motion artifact. The visualized distal vertebral arteries are patent to the basilar with the left being mildly dominant. Patent left PICA and bilateral SCA origins are identified. The basilar artery is widely patent and tortuous. Posterior communicating arteries are not identified. PCAs are patent without evidence of significant proximal stenosis. The internal carotid  arteries are patent from skull base to carotid termini without evidence of significant stenosis. ACAs and MCAs are patent without evidence of proximal branch occlusion, A1 stenosis, or M1 stenosis. There is artifactual signal loss in bilateral M2 greater than A2 branches which limits assessment. No aneurysm is identified. IMPRESSION: 1. 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 2. Subcentimeter focus of chronic hemorrhage in the right parieto-occipital white matter compatible with a cavernoma. 3. Mild chronic small vessel ischemic disease and mild-to-moderate cerebral atrophy. 4. Patent circle of Willis without proximal branch occlusion or flow limiting proximal stenosis. Electronically Signed   By: Logan Bores M.D.   On: 09/25/2017 15:26   Mr Brain W And Wo Contrast  Result Date: 09/20/2017 CLINICAL DATA:  Near syncopal event today. Fall 2 weeks ago. Orthostatic in emergency department. EXAM: MRI HEAD WITHOUT AND WITH CONTRAST MRA HEAD WITHOUT CONTRAST TECHNIQUE: Multiplanar, multiecho pulse sequences of the brain and surrounding structures were obtained without and with intravenous contrast. Angiographic images of the head were obtained using MRA technique without contrast. CONTRAST:  15mL MULTIHANCE GADOBENATE DIMEGLUMINE 529 MG/ML IV SOLN COMPARISON:  Head CT 09/21/2017 FINDINGS: MRI HEAD FINDINGS Multiple sequences are mildly to moderately motion degraded. Brain: There are numerous subcentimeter foci of acute infarction scattered throughout both cerebral hemispheres as well as left and possibly right cerebellar hemispheres. The there is cortical involvement in the frontal, parietal, and occipital lobes bilaterally, and there is also involvement of the left centrum semiovale. There is a small chronic infarct in the right cerebellum. Scattered T2 hyperintensities in the cerebral white matter and pons are nonspecific but compatible  with mild chronic small vessel ischemic disease. There is mild-to-moderate cerebral atrophy. A 5 mm T2 hyperintense focus with prominent susceptibility artifact in the right parieto-occipital white matter corresponds to the subtle hyperattenuation on CT and is without associated enhancement or edema. Chronic microhemorrhages are noted in the right cerebellum and right parietal lobe. Vascular: Major intracranial vascular flow voids are preserved. Skull and upper cervical spine: Unremarkable bone marrow signal. Sinuses/Orbits: Unremarkable orbits. Paranasal sinuses and mastoid air cells are clear. Other: None. MRA HEAD FINDINGS There is intermittent moderate motion artifact. The visualized distal vertebral arteries are patent to the basilar with the left being mildly dominant. Patent left PICA and bilateral SCA origins are identified. The basilar artery is  widely patent and tortuous. Posterior communicating arteries are not identified. PCAs are patent without evidence of significant proximal stenosis. The internal carotid arteries are patent from skull base to carotid termini without evidence of significant stenosis. ACAs and MCAs are patent without evidence of proximal branch occlusion, A1 stenosis, or M1 stenosis. There is artifactual signal loss in bilateral M2 greater than A2 branches which limits assessment. No aneurysm is identified. IMPRESSION: 1. 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 2. Subcentimeter focus of chronic hemorrhage in the right parieto-occipital white matter compatible with a cavernoma. 3. Mild chronic small vessel ischemic disease and mild-to-moderate cerebral atrophy. 4. Patent circle of Willis without proximal branch occlusion or flow limiting proximal stenosis. Electronically Signed   By: Logan Bores M.D.   On: 09/13/2017 15:26   Mr Thoracic Spine Wo Contrast  Result Date: 09/04/2017 CLINICAL DATA:  Patient  fell 09/02/2017. Severe back pain. Difficult to ambulate. EXAM: MRI THORACIC AND LUMBAR SPINE WITHOUT CONTRAST TECHNIQUE: Multiplanar and multiecho pulse sequences of the thoracic and lumbar spine were obtained without intravenous contrast. COMPARISON:  Plain films 09/02/2017. CT lumbar spine earlier today. FINDINGS: MRI THORACIC SPINE FINDINGS Alignment:  Physiologic. Vertebrae: No acute compression fracture or worrisome osseous lesion. Cord:  No significant cord compression or abnormal cord signal. Paraspinal and other soft tissues: Large goiter, substernal nodule extending on the LEFT. Median sternotomy. Gallstone. RIGHT pleural effusion. RIGHT hepatic cyst, approximate 1 x 2 cm. Disc levels: At T7-8 there is a LEFT paracentral protrusion. This does not result in stenosis or cord flattening. At T8-9 there is a central protrusion associated with posterior element hypertrophy. The T8-9 disc is mildly hyperintense, but there are no signs of infection. There is a RIGHT-sided facet joint effusion associated with asymmetric bony overgrowth. At T10-11, there is a shallow central protrusion with borderline stenosis. At T11-12, there is asymmetric facet arthropathy on the LEFT. MRI LUMBAR SPINE FINDINGS Segmentation:  Standard. Alignment: 3 mm anterolisthesis L3-4 is facet mediated there is degenerative scoliosis convex RIGHT at the thoracolumbar junction, approximately 30 degrees. Vertebrae: No compression fracture or worrisome osseous lesion. Endplate reactive changes. Conus medullaris and cauda equina: Conus extends to the L1-L2 level. The conus appears normal. The cauda equina is compressed and displaced at multiple levels, which appears to be due to a combination of spondylosis and scoliosis. There is some areas, such as opposite L4 (series 6, image 22) where the CSF has an appearance mimicking a subdural effusion or hygroma, there are no visible signs of intraspinal hematoma. Paraspinal and other soft tissues: The  bladder is no longer distended. There is no retroperitoneal adenopathy. Disc levels: L1-L2: Disc space narrowing. Shallow central protrusion. Asymmetric facet arthropathy on the LEFT. Foraminal narrowing could affect the LEFT L1 nerve root. L2-L3: Disc space narrowing. Annular bulge with osseous spurring. Posterior element hypertrophy. Moderate stenosis. BILATERAL L2 and L3 nerve root impingement are possible. L3-L4: Disc space narrowing. 3 mm anterolisthesis. Central protrusion. Posterior element hypertrophy. Severe stenosis. BILATERAL L4 nerve root impingement in the canal. RIGHT L3 nerve root impingement in the foramen. Interspinous bursa edema, consistent with Baastrup's disease. L4-L5: Disc space narrowing osseous spurring. Annular bulge. Posterior element hypertrophy. Moderate stenosis. RIGHT greater than LEFT L5 nerve root impingement. L5-S1: Disc hyperintensity without signs of infection. Central protrusion. Posterior element hypertrophy. Mild stenosis. BILATERAL subarticular zone and foraminal zone narrowing affecting the L5 and S1 nerve roots. IMPRESSION: MR THORACIC SPINE IMPRESSION No thoracic spine compression fracture  or visible hematoma. Multilevel spondylosis, worst at T8-9. mild stenosis at this level without thoracic cord compression. MR LUMBAR SPINE IMPRESSION No lumbar spine compression fracture or visible hematoma. Mild anterolisthesis at L3-4 is degenerative, not posttraumatic. Severe spinal stenosis at L3-4 relates to slip, posterior element hypertrophy, and central protrusion, affecting the L3 and L4 nerve roots. See discussion above. Moderate stenosis at L4-5 and L5-S1, related to disc space narrowing, bony overgrowth, disc material, and posterior element hypertrophy. Electronically Signed   By: Staci Righter M.D.   On: 09/04/2017 19:53   Mr Lumbar Spine Wo Contrast  Result Date: 09/04/2017 CLINICAL DATA:  Patient fell 09/02/2017. Severe back pain. Difficult to ambulate. EXAM: MRI  THORACIC AND LUMBAR SPINE WITHOUT CONTRAST TECHNIQUE: Multiplanar and multiecho pulse sequences of the thoracic and lumbar spine were obtained without intravenous contrast. COMPARISON:  Plain films 09/02/2017. CT lumbar spine earlier today. FINDINGS: MRI THORACIC SPINE FINDINGS Alignment:  Physiologic. Vertebrae: No acute compression fracture or worrisome osseous lesion. Cord:  No significant cord compression or abnormal cord signal. Paraspinal and other soft tissues: Large goiter, substernal nodule extending on the LEFT. Median sternotomy. Gallstone. RIGHT pleural effusion. RIGHT hepatic cyst, approximate 1 x 2 cm. Disc levels: At T7-8 there is a LEFT paracentral protrusion. This does not result in stenosis or cord flattening. At T8-9 there is a central protrusion associated with posterior element hypertrophy. The T8-9 disc is mildly hyperintense, but there are no signs of infection. There is a RIGHT-sided facet joint effusion associated with asymmetric bony overgrowth. At T10-11, there is a shallow central protrusion with borderline stenosis. At T11-12, there is asymmetric facet arthropathy on the LEFT. MRI LUMBAR SPINE FINDINGS Segmentation:  Standard. Alignment: 3 mm anterolisthesis L3-4 is facet mediated there is degenerative scoliosis convex RIGHT at the thoracolumbar junction, approximately 30 degrees. Vertebrae: No compression fracture or worrisome osseous lesion. Endplate reactive changes. Conus medullaris and cauda equina: Conus extends to the L1-L2 level. The conus appears normal. The cauda equina is compressed and displaced at multiple levels, which appears to be due to a combination of spondylosis and scoliosis. There is some areas, such as opposite L4 (series 6, image 22) where the CSF has an appearance mimicking a subdural effusion or hygroma, there are no visible signs of intraspinal hematoma. Paraspinal and other soft tissues: The bladder is no longer distended. There is no retroperitoneal  adenopathy. Disc levels: L1-L2: Disc space narrowing. Shallow central protrusion. Asymmetric facet arthropathy on the LEFT. Foraminal narrowing could affect the LEFT L1 nerve root. L2-L3: Disc space narrowing. Annular bulge with osseous spurring. Posterior element hypertrophy. Moderate stenosis. BILATERAL L2 and L3 nerve root impingement are possible. L3-L4: Disc space narrowing. 3 mm anterolisthesis. Central protrusion. Posterior element hypertrophy. Severe stenosis. BILATERAL L4 nerve root impingement in the canal. RIGHT L3 nerve root impingement in the foramen. Interspinous bursa edema, consistent with Baastrup's disease. L4-L5: Disc space narrowing osseous spurring. Annular bulge. Posterior element hypertrophy. Moderate stenosis. RIGHT greater than LEFT L5 nerve root impingement. L5-S1: Disc hyperintensity without signs of infection. Central protrusion. Posterior element hypertrophy. Mild stenosis. BILATERAL subarticular zone and foraminal zone narrowing affecting the L5 and S1 nerve roots. IMPRESSION: MR THORACIC SPINE IMPRESSION No thoracic spine compression fracture or visible hematoma. Multilevel spondylosis, worst at T8-9. mild stenosis at this level without thoracic cord compression. MR LUMBAR SPINE IMPRESSION No lumbar spine compression fracture or visible hematoma. Mild anterolisthesis at L3-4 is degenerative, not posttraumatic. Severe spinal stenosis at L3-4 relates to slip, posterior element hypertrophy,  and central protrusion, affecting the L3 and L4 nerve roots. See discussion above. Moderate stenosis at L4-5 and L5-S1, related to disc space narrowing, bony overgrowth, disc material, and posterior element hypertrophy. Electronically Signed   By: Staci Righter M.D.   On: 09/04/2017 19:53   Dg Chest Port 1 View  Result Date: 09/04/2017 CLINICAL DATA:  Tachypnea. EXAM: PORTABLE CHEST 1 VIEW COMPARISON:  09/21/2017 and 09/04/2017 radiographs. FINDINGS: 1510 hr. Lower lung volumes. The heart size and  mediastinal contours are stable status post median sternotomy and aortic valve replacement. There is aortic atherosclerosis. There is new patchy left lower lobe airspace disease. There are possible small bilateral pleural effusions. No edema or pneumothorax. Advanced glenohumeral arthropathic changes are present bilaterally. IMPRESSION: New patchy left lower lobe opacity may reflect atelectasis or early pneumonia. Possible small bilateral pleural effusions. Electronically Signed   By: Richardean Sale M.D.   On: 09/21/2017 15:35   Dg Chest Port 1 View  Result Date: 09/19/2017 CLINICAL DATA:  Fever and leukocytosis. EXAM: PORTABLE CHEST 1 VIEW COMPARISON:  09/04/2017 FINDINGS: 1234 hours. The lungs are clear without focal pneumonia, edema, pneumothorax or pleural effusion. The cardiopericardial silhouette is within normal limits for size. Degenerative changes noted in the shoulders bilaterally status post median sternotomy. Telemetry leads overlie the chest. IMPRESSION: Low lung volumes without acute findings. Electronically Signed   By: Misty Stanley M.D.   On: 09/21/2017 12:50   Dg Shoulder Left Port  Result Date: 09/07/2017 CLINICAL DATA:  New onset left shoulder pain. Fall a couple days ago. EXAM: LEFT SHOULDER - 1 VIEW COMPARISON:  Report from radiograph 06/05/2011. Chest radiograph 09/04/2017. FINDINGS: No acute fracture or dislocation. Severe osteoarthritis of the glenohumeral joint with joint space loss, osteophytes and fragmentation, subchondral sclerosis and cystic change. The acromioclavicular joint is congruent. IMPRESSION: 1. No acute fracture or subluxation. 2. Advanced osteoarthritis of the glenohumeral joint. Electronically Signed   By: Jeb Levering M.D.   On: 09/19/2017 23:46    Microbiology Recent Results (from the past 240 hour(s))  Blood culture (routine x 2)     Status: Abnormal   Collection Time: 09/25/2017 11:40 AM  Result Value Ref Range Status   Specimen Description   Final     BLOOD LEFT ANTECUBITAL BOTTLES DRAWN AEROBIC AND ANAEROBIC Performed at [facility], [address], [place], [place] [address]    Special Requests   Final    Blood Culture adequate volume Performed at [facility], [address] [place], [place] [address]    Culture  Setup Time   Final    GRAM POSITIVE COCCI Gram Stain Report Called to,Read Back By and Verified With: LURZ,G @ 1751 ON 09/19/17 BY JUW GS DONE @ [facility] GRAM STAIN REVIEWED-AGREE WITH RESULT 0623 09/19/17 M.[facility] CRITICAL RESULT CALLED TO, READ BACK BY AND VERIFIED WITH: G. ABBOTT, RPHARMD AT 0810 ON 09/19/17 BY C. JESSUP, MLT. Performed at [facility], [place] [address] [place], [place] [address]    Culture ENTEROCOCCUS FAECALIS (A)  Final   Report Status 09/21/2017 FINAL  Final   Organism ID, Bacteria ENTEROCOCCUS FAECALIS  Final      Susceptibility   Enterococcus faecalis - MIC*    AMPICILLIN <=2 SENSITIVE Sensitive     VANCOMYCIN 2 SENSITIVE Sensitive     GENTAMICIN SYNERGY SENSITIVE Sensitive     * ENTEROCOCCUS FAECALIS  Blood Culture ID Panel (Reflexed)     Status: Abnormal   Collection Time: 09/23/2017 11:40 AM  Result Value  Ref Range Status   Enterococcus species DETECTED (A) NOT DETECTED Final    Comment: CRITICAL RESULT CALLED TO, READ BACK BY AND VERIFIED WITH: G. ABBOTT, RPHARMD AT 0810 ON 09/19/17 BY C. JESSUP, MLT.    Vancomycin resistance NOT DETECTED NOT DETECTED Final   Listeria monocytogenes NOT DETECTED NOT DETECTED Final   Staphylococcus species NOT DETECTED NOT DETECTED Final   Staphylococcus aureus NOT DETECTED NOT DETECTED Final   Streptococcus species NOT DETECTED NOT DETECTED Final   Streptococcus agalactiae NOT DETECTED NOT DETECTED Final   Streptococcus pneumoniae NOT DETECTED NOT DETECTED Final   Streptococcus pyogenes NOT DETECTED NOT DETECTED Final   Acinetobacter baumannii NOT DETECTED NOT DETECTED Final   Enterobacteriaceae species NOT DETECTED NOT DETECTED Final    Enterobacter cloacae complex NOT DETECTED NOT DETECTED Final   Escherichia coli NOT DETECTED NOT DETECTED Final   Klebsiella oxytoca NOT DETECTED NOT DETECTED Final   Klebsiella pneumoniae NOT DETECTED NOT DETECTED Final   Proteus species NOT DETECTED NOT DETECTED Final   Serratia marcescens NOT DETECTED NOT DETECTED Final   Haemophilus influenzae NOT DETECTED NOT DETECTED Final   Neisseria meningitidis NOT DETECTED NOT DETECTED Final   Pseudomonas aeruginosa NOT DETECTED NOT DETECTED Final   Candida albicans NOT DETECTED NOT DETECTED Final   Candida glabrata NOT DETECTED NOT DETECTED Final   Candida krusei NOT DETECTED NOT DETECTED Final   Candida parapsilosis NOT DETECTED NOT DETECTED Final   Candida tropicalis NOT DETECTED NOT DETECTED Final    Comment: Performed at [facility], [place] [address], [place], [place] [address]  Blood culture (routine x 2)     Status: Abnormal   Collection Time: 09/11/2017 11:49 AM  Result Value Ref Range Status   Specimen Description   Final    BLOOD LEFT HAND BOTTLES DRAWN AEROBIC AND ANAEROBIC Performed at [facility], [address] [place], [place] [address]    Special Requests   Final    Blood Culture results may not be optimal due to an inadequate volume of blood received in culture bottles Performed at [facility], [address], [place], [place] [address]    Culture  Setup Time   Final    GRAM POSITIVE COCCI Gram Stain Report Called to,Read Back By and Verified With: LURZ,G @ 9147 ON 09/19/17 BY JUW GS DONE @ [facility] GRAM STAIN [place] WITH RESULT 0619 09/19/17 M.[facility] IN BOTH AEROBIC AND ANAEROBIC BOTTLES    Culture (A)  Final    ENTEROCOCCUS FAECALIS SUSCEPTIBILITIES PERFORMED ON PREVIOUS CULTURE WITHIN THE LAST 5 DAYS. Performed at [facility], [place] [address] [place], [place] [address]    Report Status 09/21/2017 FINAL  Final  Urine Culture     Status: Abnormal   Collection Time: 09/14/2017 12:45 PM  Result Value Ref  Range Status   Specimen Description   Final    URINE, CATHETERIZED Performed at [facility], [address], [place], [place] [address]    Special Requests   Final    NONE Performed at [facility], [address], [place], [place] [address]    Culture 70,000 COLONIES/mL ENTEROCOCCUS FAECALIS (A)  Final   Report Status 09/21/2017 FINAL  Final   Organism ID, Bacteria ENTEROCOCCUS FAECALIS (A)  Final      Susceptibility   Enterococcus faecalis - MIC*    AMPICILLIN <=2 SENSITIVE Sensitive     LEVOFLOXACIN 1 SENSITIVE Sensitive     NITROFURANTOIN <=16 SENSITIVE Sensitive     VANCOMYCIN  2 SENSITIVE Sensitive     * 70,000 COLONIES/mL ENTEROCOCCUS FAECALIS  Culture, blood (routine x 2)     Status: None   Collection Time: 09/27/2017 11:59 AM  Result Value Ref Range Status   Specimen Description BLOOD LEFT ANTECUBITAL  Final   Special Requests   Final    BOTTLES DRAWN AEROBIC AND ANAEROBIC Blood Culture results may not be optimal due to an excessive volume of blood received in culture bottles   Culture   Final    NO GROWTH 5 DAYS Performed at [facility], [place] [address], [place], [place] [address]    Report Status 09/25/2017 FINAL  Final  Culture, blood (routine x 2)     Status: None   Collection Time: 09/27/2017 12:10 PM  Result Value Ref Range Status   Specimen Description BLOOD LEFT HAND  Final   Special Requests   Final    BOTTLES DRAWN AEROBIC AND ANAEROBIC Blood Culture adequate volume   Culture   Final    NO GROWTH 5 DAYS Performed at [facility], [place] [address], [place],  [address]    Report Status 09/25/2017 FINAL  Final    Lab Basic Metabolic Panel: Recent Labs  Lab 09/21/17 0217  NA 138  K 3.4*  CL 106  CO2 20*  GLUCOSE 117*  BUN 22*  CREATININE 1.01  CALCIUM 8.2*   Liver Function Tests: No results for input(s): AST, ALT, ALKPHOS, BILITOT, PROT, ALBUMIN in the last 168 hours. No results for input(s): LIPASE, AMYLASE in the last 168  hours. No results for input(s): AMMONIA in the last 168 hours. CBC: No results for input(s): WBC, NEUTROABS, HGB, HCT, MCV, PLT in the last 168 hours. Cardiac Enzymes: Recent Labs  Lab 09/05/2017 2049 09/21/17 0217 09/21/17 0829  TROPONINI <0.03 <0.03 <0.03   Sepsis Labs: No results for input(s): PROCALCITON, WBC, LATICACIDVEN in the last 168 hours.  Procedures/Operations  TEE  Findings: Please see echo section for full report.  Normal LV size with mild focal basal septal hypertrophy.  EF 60-65%.  Normal RV size and systolic function.  Mild left atrial enlargement, no LA appendage thrombus.  Normal right atrium.  No PFO/ASD by color doppler. Trivial TR.  Trivial mitral regurgitation, no significant stenosis.  There was a bioprosthetic aortic valve.  There was extensive vegetation that appeared to involve each leaflet. No significant regurgitation noted.  Mean gradient across the aortic valve measured 56 mmHg.  Normal caliber aorta, grade III plaque descending thoracic aorta.    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      Ripudeep Rai 09/27/2017, 8:32 PM

## 2017-10-01 NOTE — Progress Notes (Signed)
Family remains at bedside. Comfort cart obtained.  [facility] has been notified for pick up from room.  Post-mortem flowsheet has been completed.

## 2017-10-01 NOTE — Progress Notes (Signed)
[facility] at bedside to transport patient. Family remains at bedside.

## 2017-10-01 NOTE — Significant Event (Addendum)
Rapid Response Event Note  Call Time 0442 Arrival Time 7543 End Time 29  Called by charge RN at [phone] to come assess patient for increased RR and SOB. RN had already spoken to [facility] NP prior to my arrival. I was with another patient and I arrived at 40.  When I arrived, HR was in the 120-130s, RR was in the upper 20s, sats were not able to be obtained because patient was very diaphoretic, clammy, and cool to touch.  Patient was hypotensive as well. RN did speak with [facility] NP, small fluid bolus was given.  RN did administer some morphine for dyspnea prior to my arrival.   Patient appears to actively dying and while I was at the bedside, RR slowed down and was now 6-8, SBP was in the 50s, HR was now in 50 (was in the 120-130s just two minutes prior). [facility] NP came to the bedside, multiple attempts made to contact family.   Patient was DNR.  NO RRT Interventions  Joe Shah R

## 2017-10-01 NOTE — Progress Notes (Signed)
Death note: Notified by RN that pt had increased WOB w/ very wet sounding BBS. Was given 2 mg IV morphine which was minimal help. RN also reported ST in the140's with precipitous drop in SBP to the 80's. An IVFB was ordered. This NP went to bedside and noted pt unresponsive and actively dying. Pt was pale, agonal respirations and by this time HR bradycardic in the 40-50's and SBP 50's. Prior to event pt had reported that he was very tired, RN also reports he had been somewhat "uncomfortable appearing" and anxious but voiced no specific c/o's of pain or other symptom. Attempted to call family to alert them of pt's rapid decline but got answering machines at both listed numbers. Messages left to call back. As pt DNR focus was on comfort w/o escalation of care. RN pronounced pt at 0527.  Jeryl Columbia, NP-C [facility] Pager [phone]

## 2017-10-01 NOTE — Progress Notes (Signed)
Respirations continue high 30, non-rebreather applied, respiratory called, rapid response called & on call notified of status.  New orders received, initiated.  Patient unresponsive, lungs wet, suctioned small amount sputum.  Attempted to call family x 2, unsuccessful, messages left x 2.

## 2017-10-01 NOTE — Progress Notes (Signed)
Continues to decline & at 0527 became pulseless, no respirations.  On call notified.  Attempted to call family 3rd time, message left.

## 2017-10-01 DEATH — deceased
